# Patient Record
Sex: Male | Born: 1960 | Race: White | Hispanic: No | Marital: Married | Smoking: Never smoker
Health system: Southern US, Community
[De-identification: ages and names within clinical notes are randomized; demographics above are authoritative.]

## PROBLEM LIST (undated history)

## (undated) DIAGNOSIS — R609 Edema, unspecified: Secondary | ICD-10-CM

## (undated) DIAGNOSIS — I2699 Other pulmonary embolism without acute cor pulmonale: Secondary | ICD-10-CM

## (undated) DIAGNOSIS — G4730 Sleep apnea, unspecified: Secondary | ICD-10-CM

## (undated) DIAGNOSIS — I10 Essential (primary) hypertension: Secondary | ICD-10-CM

## (undated) DIAGNOSIS — K219 Gastro-esophageal reflux disease without esophagitis: Secondary | ICD-10-CM

## (undated) HISTORY — DX: Other pulmonary embolism without acute cor pulmonale: I26.99

## (undated) HISTORY — PX: FRACTURE SURGERY: SHX138

## (undated) HISTORY — PX: CARDIAC CATHETERIZATION: SHX172

---

## 2005-08-23 ENCOUNTER — Ambulatory Visit: Payer: Self-pay

## 2007-02-10 ENCOUNTER — Ambulatory Visit: Payer: Self-pay | Admitting: Specialist

## 2008-11-02 ENCOUNTER — Ambulatory Visit: Payer: Self-pay | Admitting: Internal Medicine

## 2011-07-20 ENCOUNTER — Ambulatory Visit: Payer: Self-pay | Admitting: Gastroenterology

## 2011-07-23 LAB — PATHOLOGY REPORT

## 2013-03-25 ENCOUNTER — Ambulatory Visit: Payer: Self-pay | Admitting: Surgery

## 2013-03-25 LAB — BASIC METABOLIC PANEL
Anion Gap: 5 — ABNORMAL LOW (ref 7–16)
BUN: 18 mg/dL (ref 7–18)
Calcium, Total: 9 mg/dL (ref 8.5–10.1)
Chloride: 107 mmol/L (ref 98–107)
Creatinine: 0.77 mg/dL (ref 0.60–1.30)
EGFR (African American): >60
Osmolality: 280 (ref 275–301)
Potassium: 3.8 mmol/L (ref 3.5–5.1)

## 2013-03-25 LAB — CBC WITH DIFFERENTIAL/PLATELET
Basophil #: 0 10*3/uL (ref 0.0–0.1)
Basophil %: 0.7 %
Eosinophil %: 1.3 %
HCT: 40.5 % (ref 40.0–52.0)
MCH: 30.6 pg (ref 26.0–34.0)
MCHC: 34.9 g/dL (ref 32.0–36.0)
MCV: 88 fL (ref 80–100)
Monocyte #: 0.5 x10 3/mm (ref 0.2–1.0)
Monocyte %: 7.2 %
Neutrophil %: 65 %
Platelet: 181 10*3/uL (ref 150–440)
RBC: 4.63 10*6/uL (ref 4.40–5.90)
WBC: 6.4 10*3/uL (ref 3.8–10.6)

## 2013-03-31 ENCOUNTER — Ambulatory Visit: Payer: Self-pay | Admitting: Surgery

## 2013-12-27 ENCOUNTER — Emergency Department: Payer: Self-pay | Admitting: Emergency Medicine

## 2014-05-04 DIAGNOSIS — E782 Mixed hyperlipidemia: Secondary | ICD-10-CM | POA: Insufficient documentation

## 2014-08-06 NOTE — Op Note (Signed)
PATIENT NAME:  Nathan Scott, Nathan Scott MR#:  [id number] DATE OF BIRTH:  1960/04/29  DATE OF PROCEDURE:  03/31/2013  PREOPERATIVE DIAGNOSIS: Right inguinal hernia.   POSTOPERATIVE DIAGNOSIS: Right inguinal hernia.   PROCEDURE: Right inguinal hernia repair.   SURGEON: Rochel Brome, MD.   ANESTHESIA: General.   INDICATION: This 54 year old male has a history of pain in the right groin associated with heavy lifting and straining and did have physical findings of a right inguinal hernia and repair was recommended for definitive treatment.   DESCRIPTION OF PROCEDURE: The patient was placed on the operating table in the supine position under general anesthesia. The right lower abdomen was clipped and prepared with ChloraPrep and draped in a sterile manner.   A right lower quadrant transversely oriented suprapubic incision was made and carried down through subcutaneous tissues. One small traversing vein was divided with electrocautery. Several small bleeding points were cauterized. Scarpa's fascia was incised. The external oblique aponeurosis was incised along the course of its fibers to expose the inguinal cord structures. The cord structures were mobilized seeing no indirect component. There was a finding of a direct inguinal hernia with a 3 cm defect in the floor of the inguinal canal with a sac protruding up through this defect. The sac was dissected free from surrounding structures and was shallow and was inverted. The shelving edge of the inguinal ligament was identified. Next, the repair was carried out with a row of 0 Surgilon sutures, suturing the conjoined tendon to the shelving edge of the inguinal ligament incorporating transversalis fascia into the repair and the last stitch was placed adjacent to the internal ring. The repair looked good and only Bard soft polypropylene mesh was placed over the repair. This was approximately 2.5 x 3.5 cm in dimension. It was sutured to the repair with 0 Surgilon and  also sutured medially to the conjoined tendon. Hemostasis was intact. Cord structures were replaced along the floor of the inguinal canal. Cut edges of the external oblique aponeurosis were closed with a running 4-0 Vicryl suture. The deep fascia superior and lateral to the repair site was infiltrated with 0.5% Sensorcaine with epinephrine. Subcutaneous tissues were infiltrated as well using a total of 20 mL. Scarpa's fascia was closed with interrupted 4-0 Vicryl. The skin was closed with running 4-0 Monocryl subcuticular suture and Dermabond. The patient tolerated the procedure satisfactorily and was prepared for transfer to the recovery room.   ____________________________ Lenna Sciara. Rochel Brome, MD jws:aw D: 03/31/2013 08:43:44 ET T: 03/31/2013 08:55:31 ET JOB#: [id number]  cc: Loreli Dollar, MD, <Dictator> Loreli Dollar MD ELECTRONICALLY SIGNED 04/01/2013 10:34

## 2015-07-14 ENCOUNTER — Other Ambulatory Visit: Payer: Self-pay | Admitting: Internal Medicine

## 2015-07-14 DIAGNOSIS — E785 Hyperlipidemia, unspecified: Secondary | ICD-10-CM

## 2015-07-20 ENCOUNTER — Ambulatory Visit
Admission: RE | Admit: 2015-07-20 | Discharge: 2015-07-20 | Disposition: A | Discharge status: Home / Self Care | Payer: Managed Care, Other (non HMO) | Source: Ambulatory Visit | Attending: Internal Medicine | Admitting: Internal Medicine

## 2015-07-20 DIAGNOSIS — E785 Hyperlipidemia, unspecified: Secondary | ICD-10-CM | POA: Diagnosis not present

## 2015-07-20 DIAGNOSIS — I6529 Occlusion and stenosis of unspecified carotid artery: Secondary | ICD-10-CM | POA: Diagnosis present

## 2016-02-13 ENCOUNTER — Encounter
Admission: RE | Admit: 2016-02-13 | Discharge: 2016-02-13 | Disposition: A | Discharge status: Home / Self Care | Payer: Managed Care, Other (non HMO) | Source: Ambulatory Visit | Attending: Otolaryngology | Admitting: Otolaryngology

## 2016-02-13 DIAGNOSIS — Z01812 Encounter for preprocedural laboratory examination: Secondary | ICD-10-CM | POA: Diagnosis not present

## 2016-02-13 HISTORY — DX: Sleep apnea, unspecified: G47.30

## 2016-02-13 HISTORY — DX: Gastro-esophageal reflux disease without esophagitis: K21.9

## 2016-02-13 HISTORY — DX: Edema, unspecified: R60.9

## 2016-02-13 LAB — POTASSIUM: POTASSIUM: 3.5 mmol/L (ref 3.5–5.1)

## 2016-02-13 NOTE — Patient Instructions (Signed)
  Your procedure is scheduled on: 02/20/16 Report to Day Surgery.[facility]OND FLOOR To find out your arrival time please call [phone] between 1PM - 3PM on 02/17/16.  Remember: Instructions that are not followed completely may result in serious medical risk, up to and including death, or upon the discretion of your surgeon and anesthesiologist your surgery may need to be rescheduled.    __X__ 1. Do not eat food or drink liquids after midnight. No gum chewing or hard candies.     ____ 2. No Alcohol for 24 hours before or after surgery.   ____ 3. Do Not Smoke For 24 Hours Prior to Your Surgery.   ____ 4. Bring all medications with you on the day of surgery if instructed.    ___X_ 5. Notify your doctor if there is any change in your medical condition     (cold, fever, infections).       Do not wear jewelry, make-up, hairpins, clips or nail polish.  Do not wear lotions, powders, or perfumes. You may wear deodorant.  Do not shave 48 hours prior to surgery. Men may shave face and neck.  Do not bring valuables to the hospital.    [facility] is not responsible for any belongings or valuables.               Contacts, dentures or bridgework may not be worn into surgery.  Leave your suitcase in the car. After surgery it may be brought to your room.  For patients admitted to the hospital, discharge time is determined by your                treatment team.   Patients discharged the day of surgery will not be allowed to drive home.      _X___ Take these medicines the morning of surgery with A SIP OF WATER:    1. OMEPRAZOLE AT BEDTIME AND AM OF SURGERY  2.   3.   4.  5.  6.  ____ Fleet Enema (as directed)   ____ Use CHG Soap as directed  ____ Use inhalers on the day of surgery  ____ Stop metformin 2 days prior to surgery    ____ Take 1/2 of usual insulin dose the night before surgery and none on the morning of surgery.   __X__ Stop Coumadin/Plavix/aspirin on   STOP  ASPIRIN NOW __X__ Stop Anti-inflammatories on             LODINE NOT TAKEN X 1 MONTH   __X__ Stop supplements until after surgery.   ALL EXCEPT MULTI/ MAGNESIUM/ PROBIOTIC  ____ Bring C-Pap to the hospital.   GIVEN LIVING WILL INFO

## 2016-02-17 MED ORDER — PHENYLEPHRINE HCL 10 % OP SOLN
Freq: Once | OPHTHALMIC | Status: DC
  Filled 2016-02-17: qty 10

## 2016-02-20 ENCOUNTER — Ambulatory Visit: Payer: Managed Care, Other (non HMO) | Admitting: Certified Registered"

## 2016-02-20 ENCOUNTER — Ambulatory Visit
Admission: RE | Admit: 2016-02-20 | Discharge: 2016-02-20 | Disposition: A | Discharge status: Home / Self Care | Payer: Managed Care, Other (non HMO) | Source: Ambulatory Visit | Attending: Otolaryngology | Admitting: Otolaryngology

## 2016-02-20 ENCOUNTER — Encounter
Admission: RE | Disposition: A | Discharge status: Home / Self Care | Payer: Self-pay | Source: Ambulatory Visit | Attending: Otolaryngology

## 2016-02-20 DIAGNOSIS — Z7982 Long term (current) use of aspirin: Secondary | ICD-10-CM | POA: Diagnosis not present

## 2016-02-20 DIAGNOSIS — G4733 Obstructive sleep apnea (adult) (pediatric): Secondary | ICD-10-CM | POA: Insufficient documentation

## 2016-02-20 DIAGNOSIS — J301 Allergic rhinitis due to pollen: Secondary | ICD-10-CM | POA: Diagnosis not present

## 2016-02-20 DIAGNOSIS — J342 Deviated nasal septum: Secondary | ICD-10-CM | POA: Diagnosis present

## 2016-02-20 DIAGNOSIS — K219 Gastro-esophageal reflux disease without esophagitis: Secondary | ICD-10-CM | POA: Diagnosis not present

## 2016-02-20 DIAGNOSIS — J343 Hypertrophy of nasal turbinates: Secondary | ICD-10-CM | POA: Insufficient documentation

## 2016-02-20 DIAGNOSIS — E7800 Pure hypercholesterolemia, unspecified: Secondary | ICD-10-CM | POA: Insufficient documentation

## 2016-02-20 HISTORY — PX: NASAL SEPTOPLASTY W/ TURBINOPLASTY: SHX2070

## 2016-02-20 SURGERY — SEPTOPLASTY, NOSE, WITH NASAL TURBINATE REDUCTION
Anesthesia: General | Laterality: Bilateral

## 2016-02-20 MED ORDER — MIDAZOLAM HCL 2 MG/2ML IJ SOLN
INTRAMUSCULAR | Status: DC | PRN
  Administered 2016-02-20: 2 mg via INTRAVENOUS

## 2016-02-20 MED ORDER — NEOSTIGMINE METHYLSULFATE 10 MG/10ML IV SOLN
INTRAVENOUS | Status: DC | PRN
  Administered 2016-02-20: 5 mg via INTRAVENOUS

## 2016-02-20 MED ORDER — LACTATED RINGERS IV SOLN
INTRAVENOUS | Status: DC
  Administered 2016-02-20: 06:00:00 via INTRAVENOUS

## 2016-02-20 MED ORDER — DEXAMETHASONE SODIUM PHOSPHATE 10 MG/ML IJ SOLN
INTRAMUSCULAR | Status: DC | PRN
  Administered 2016-02-20: 10 mg via INTRAVENOUS

## 2016-02-20 MED ORDER — BACITRACIN ZINC 500 UNIT/GM EX OINT
TOPICAL_OINTMENT | CUTANEOUS | Status: AC
  Filled 2016-02-20: qty 28.35

## 2016-02-20 MED ORDER — PROPOFOL 10 MG/ML IV BOLUS
INTRAVENOUS | Status: DC | PRN
  Administered 2016-02-20: 200 mg via INTRAVENOUS

## 2016-02-20 MED ORDER — LIDOCAINE-EPINEPHRINE (PF) 1 %-1:200000 IJ SOLN
INTRAMUSCULAR | Status: AC
  Filled 2016-02-20: qty 30

## 2016-02-20 MED ORDER — ROCURONIUM BROMIDE 100 MG/10ML IV SOLN
INTRAVENOUS | Status: DC | PRN
  Administered 2016-02-20: 5 mg via INTRAVENOUS
  Administered 2016-02-20: 10 mg via INTRAVENOUS
  Administered 2016-02-20: 30 mg via INTRAVENOUS

## 2016-02-20 MED ORDER — FENTANYL CITRATE (PF) 100 MCG/2ML IJ SOLN: 25.0000 ug | INTRAMUSCULAR | Status: DC | PRN

## 2016-02-20 MED ORDER — LIDOCAINE HCL (PF) 4 % IJ SOLN
INTRAMUSCULAR | Status: DC | PRN
  Administered 2016-02-20: 4 mL via INTRADERMAL

## 2016-02-20 MED ORDER — ONDANSETRON HCL 4 MG/2ML IJ SOLN: 4.0000 mg | Freq: Once | INTRAMUSCULAR | Status: DC | PRN

## 2016-02-20 MED ORDER — SUCCINYLCHOLINE CHLORIDE 20 MG/ML IJ SOLN
INTRAMUSCULAR | Status: DC | PRN
  Administered 2016-02-20: 100 mg via INTRAVENOUS

## 2016-02-20 MED ORDER — FENTANYL CITRATE (PF) 100 MCG/2ML IJ SOLN
INTRAMUSCULAR | Status: DC | PRN
  Administered 2016-02-20: 100 ug via INTRAVENOUS

## 2016-02-20 MED ORDER — OXYMETAZOLINE HCL 0.05 % NA SOLN
NASAL | Status: DC | PRN
  Administered 2016-02-20: 4 via TOPICAL

## 2016-02-20 MED ORDER — LIDOCAINE HCL (CARDIAC) 20 MG/ML IV SOLN
INTRAVENOUS | Status: DC | PRN
  Administered 2016-02-20: 40 mg via INTRAVENOUS

## 2016-02-20 MED ORDER — GLYCOPYRROLATE 0.2 MG/ML IJ SOLN
INTRAMUSCULAR | Status: DC | PRN
  Administered 2016-02-20: 1 mg via INTRAVENOUS

## 2016-02-20 MED ORDER — PHENYLEPHRINE HCL 10 % OP SOLN
OPHTHALMIC | Status: DC | PRN
  Administered 2016-02-20: 10 mL via NASAL

## 2016-02-20 MED ORDER — BACITRACIN 500 UNIT/GM OP OINT
TOPICAL_OINTMENT | OPHTHALMIC | Status: AC
  Filled 2016-02-20: qty 3.5

## 2016-02-20 MED ORDER — LIDOCAINE-EPINEPHRINE (PF) 1 %-1:200000 IJ SOLN
INTRAMUSCULAR | Status: DC | PRN
  Administered 2016-02-20: 6 mL

## 2016-02-20 MED ORDER — ONDANSETRON HCL 4 MG/2ML IJ SOLN
INTRAMUSCULAR | Status: DC | PRN
  Administered 2016-02-20: 4 mg via INTRAVENOUS

## 2016-02-20 SURGICAL SUPPLY — 23 items
BLADE SURG 15 STRL LF DISP TIS (BLADE) ×1 IMPLANT
BLADE SURG 15 STRL SS (BLADE) ×2
CANISTER SUCT 1200ML W/VALVE (MISCELLANEOUS) ×3 IMPLANT
COAG SUCT 10F 3.5MM HAND CTRL (MISCELLANEOUS) ×3 IMPLANT
DRSG NASAL 4CM NASOPORE (MISCELLANEOUS) IMPLANT
ELECT REM PT RETURN 9FT ADLT (ELECTROSURGICAL) ×3
ELECTRODE REM PT RTRN 9FT ADLT (ELECTROSURGICAL) ×1 IMPLANT
GLOVE PROTEXIS LATEX SZ 7.5 (GLOVE) ×3 IMPLANT
GOWN STRL REUS W/ TWL LRG LVL3 (GOWN DISPOSABLE) ×2 IMPLANT
GOWN STRL REUS W/TWL LRG LVL3 (GOWN DISPOSABLE) ×4
LABEL OR SOLS (LABEL) IMPLANT
NEEDLE SPNL 25GX3.5 QUINCKE BL (NEEDLE) ×3 IMPLANT
NS IRRIG 500ML POUR BTL (IV SOLUTION) ×3 IMPLANT
PACK HEAD/NECK (MISCELLANEOUS) ×3 IMPLANT
PACKING NASAL EPIS 4X2.4 XEROG (MISCELLANEOUS) ×3 IMPLANT
PATTIES SURGICAL .5 X3 (DISPOSABLE) ×3 IMPLANT
SPLINT NASAL REUTER (MISCELLANEOUS) ×3 IMPLANT
SUT CHROMIC 3-0 (SUTURE) ×2
SUT CHROMIC 3-0 KS 27XMFL CR (SUTURE) ×1
SUT ETHILON 3-0 KS 30 BLK (SUTURE) ×3 IMPLANT
SUT PLAIN GUT 4-0 (SUTURE) IMPLANT
SUTURE CHRMC 3-0 KS 27XMFL CR (SUTURE) ×1 IMPLANT
SYR 3ML LL SCALE MARK (SYRINGE) ×3 IMPLANT

## 2016-02-20 NOTE — H&P (Signed)
  H&P has been reviewed and no changes necessary. To be downloaded later. 

## 2016-02-20 NOTE — Op Note (Signed)
02/20/2016  8:26 AM  [id number]   Pre-Op Dx:  Deviated Nasal Septum, Hypertrophic Inferior Turbinates, hypertrophic middle turbinates  Post-op Dx: Same  Proc: Nasal Septoplasty, Bilateral Partial Reduction Inferior Turbinates, bilateral partial reduction of the middle turbinates   Surg:  Nathan Scott  Anes:  GOT  EBL:  50 mL  Comp:  None  Findings: The septum was deviated to the right side at the cartilaginous plate and ethmoid plate was overhanging cartilage inferiorly on the left. The inferior middle turbinates were enlarged.  Procedure: With the patient in a comfortable supine position,  general orotracheal anesthesia was induced without difficulty.     The patient received preoperative Afrin spray for topical decongestion and vasoconstriction.  Intravenous prophylactic antibiotics were administered.  At an appropriate level, the patient was placed in a semi-sitting position.  Nasal vibrissae were trimmed.   1% Xylocaine with 1:200,000 epinephrine, 6 cc's, was infiltrated into the anterior floor of the nose, into the nasal spine region, into the membranous columella, and finally into the submucoperichondrial plane of the septum on both sides.  Several minutes were allowed for this to take effect.  Cottoniod pledgetts soaked in Afrin and 4% Xylocaine were placed into both nasal cavities and left while the patient was prepped and draped in the standard fashion.  The materials were removed from the nose and observed to be intact and correct in number.  The nose was inspected with a headlight with the findings as described above.  A left Killian incision was sharply executed and carried down to the caudal edge of the quadrangular cartilage. The mucoperichondrium was elelvated along the quadrangular plate back to the bony-cartilaginous junction. The mucoperiostium was then elevated along the ethmoid plate and the vomer. The boney-catilaginous junction was then split with a freer elevator  and the mucoperiosteum was elevated on the opposite side. The mucoperiosteum was then elevated along the maxillary crest as needed to expose the crooked bone of the crest.  Boney spurs of the vomer and maxillary crest were removed with Donavan Foil forceps.  The cartilaginous plate was trimmed along its posterior and inferior borders of about 2 mm of cartilage to free it up inferiorly. Some of the deviated ethmoid plate was then fractured and removed with Takahashi forceps to free up the posterior border of the quadrangular plate and allow it to swing back to the midline. The mucosal flaps were placed back into their anatomic position to allow visualization of the airways. The septum now sat in the midline with an improved airway.  A 3-0 Chromic suture on a Keith needle in used to anchor the inferior septum at the nasal spine with a through and through suture. The mucosal flaps are then sutured together using a through and through whip stitch of 4-0 Plain Gut with a mini-Keith needle. This was used to close the Duck incision as well.   The inferior turbinates were then inspected. An incision was created along the inferior aspect of the left inferior turbinate with removal of some of the inferior soft tissue and bone. Electrocautery was used to control bleeding in the area. The remaining turbinate was then outfractured to open up the airway further. There was no significant bleeding noted. The right turbinate was then trimmed and outfractured in a similar fashion.  Middle turbinates were then visualized and were infractured some. There was lots of redundant tissue along its lateral inferior border. Trimming was done here along with electrocautery. This was done on both sides. A piece of  xerogel was then placed in the middle meatus with widening it to liquefy it.  The airways were then visualized and showed open passageways on both sides that were significantly improved compared to before surgery. There was  no signifcant bleeding. Nasal splints were applied to both sides of the septum using Xomed 0.55mm regular sized splints that were trimmed, and then held in position with a 3-0 Nylon through and through suture.  The patient was turned back over to anesthesia, and awakened, extubated, and taken to the PACU in satisfactory condition.  Dispo:   PACU to home  Plan: Ice, elevation, narcotic analgesia, steroid taper, and prophylactic antibiotics for the duration of indwelling nasal foreign bodies.  We will reevaluate the patient in the office in 6 days and remove the septal splints.  Return to work in 10 days, strenuous activities in two weeks.   Nathan Scott 02/20/2016 8:26 AM

## 2016-02-20 NOTE — Progress Notes (Addendum)
Chaplain rounded the unit to complete Advance Directive before surgery.  Paperwork completed. Absence of notary. Notaries unavailable. Minerva Fester [phone]

## 2016-02-20 NOTE — Anesthesia Procedure Notes (Addendum)
Procedure Name: Intubation Performed by: Lance Muss Pre-anesthesia Checklist: Patient identified, Patient being monitored, Timeout performed, Emergency Drugs available and Suction available Patient Re-evaluated:Patient Re-evaluated prior to inductionOxygen Delivery Method: Circle system utilized Preoxygenation: Pre-oxygenation with 100% oxygen Intubation Type: IV induction Ventilation: Mask ventilation without difficulty Laryngoscope Size: Mac and 4 Grade View: Grade I Tube type: Oral Rae Tube size: 7.5 mm Number of attempts: 1 Airway Equipment and Method: Stylet and LTA kit utilized Placement Confirmation: ETT inserted through vocal cords under direct vision,  positive ETCO2 and breath sounds checked- equal and bilateral Tube secured with: Tape Dental Injury: Teeth and Oropharynx as per pre-operative assessment

## 2016-02-20 NOTE — Anesthesia Postprocedure Evaluation (Signed)
Anesthesia Post Note  Patient: Nathan Scott  Procedure(s) Performed: Procedure(s) (LRB): NASAL SEPTOPLASTY WITH TURBINATE REDUCTION (Bilateral)  Patient location during evaluation: PACU Anesthesia Type: General Level of consciousness: awake and alert Pain management: pain level controlled Vital Signs Assessment: post-procedure vital signs reviewed and stable Respiratory status: spontaneous breathing, nonlabored ventilation, respiratory function stable and patient connected to nasal cannula oxygen Cardiovascular status: blood pressure returned to baseline and stable Postop Assessment: no signs of nausea or vomiting Anesthetic complications: no    Last Vitals:  Vitals:   02/20/16 0907 02/20/16 0922  BP: 126/89 131/87  Pulse: 73 62  Resp: 16 18  Temp:      Last Pain:  Vitals:   02/20/16 0922  TempSrc:   PainSc: 1                  Molli Barrows

## 2016-02-20 NOTE — Transfer of Care (Signed)
Immediate Anesthesia Transfer of Care Note  Patient: Nathan Scott  Procedure(s) Performed: Procedure(s): NASAL SEPTOPLASTY WITH TURBINATE REDUCTION (Bilateral)  Patient Location: PACU  Anesthesia Type:General  Level of Consciousness: sedated and responds to stimulation  Airway & Oxygen Therapy: Patient Spontanous Breathing and Patient connected to face mask oxygen  Post-op Assessment: Report given to RN and Post -op Vital signs reviewed and stable  Post vital signs: Reviewed and stable  Last Vitals:  Vitals:   02/20/16 0837 02/20/16 0838  BP: 125/82 125/82  Pulse: 78 72  Resp: 14 11  Temp: 36.5 C     Last Pain:  Vitals:   02/20/16 0837  TempSrc:   PainSc: (P) Asleep         Complications: No apparent anesthesia complications

## 2016-02-20 NOTE — OR Nursing (Signed)
Hospital Chaplin in to see patient and wife.  Both were made aware that currently there is not a notary available.  Dr Ladene Artist present as well. Patient and wife want to proceed with surgery at this time.

## 2016-02-20 NOTE — Anesthesia Preprocedure Evaluation (Signed)
Anesthesia Evaluation  Patient identified by MRN, date of birth, ID band Patient awake    Reviewed: Allergy & Precautions, H&P , NPO status , Patient's Chart, lab work & pertinent test results, reviewed documented beta blocker date and time   Airway Mallampati: II  TM Distance: >3 FB Neck ROM: full    Dental  (+) Teeth Intact   Pulmonary neg pulmonary ROS,    Pulmonary exam normal        Cardiovascular negative cardio ROS Normal cardiovascular exam Rhythm:regular Rate:Normal     Neuro/Psych Previous vasovagal from trumpet, o/w neg negative neurological ROS  negative psych ROS   GI/Hepatic negative GI ROS, Neg liver ROS, GERD  Medicated,  Endo/Other  negative endocrine ROS  Renal/GU negative Renal ROS  negative genitourinary   Musculoskeletal   Abdominal   Peds  Hematology negative hematology ROS (+)   Anesthesia Other Findings Past Medical History: No date: Edema     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 No date: GERD (gastroesophageal reflux disease) No date: Sleep apnea     Comment: no cpap x 8 years Past Surgical History: No date: FRACTURE SURGERY Left     Comment: pinning leg fx BMI    Body Mass Index:  29.03 kg/m     Reproductive/Obstetrics negative OB ROS                             Anesthesia Physical Anesthesia Plan  ASA: II  Anesthesia Plan: General ETT   Post-op Pain Management:    Induction:   Airway Management Planned:   Additional Equipment:   Intra-op Plan:   Post-operative Plan:   Informed Consent: I have reviewed the patients History and Physical, chart, labs and discussed the procedure including the risks, benefits and alternatives for the proposed anesthesia with the patient or authorized representative who has indicated his/her understanding and acceptance.   Dental Advisory Given  Plan Discussed with: CRNA  Anesthesia Plan Comments:          Anesthesia Quick Evaluation

## 2016-02-20 NOTE — OR Nursing (Signed)
Hospital Chaplain notified upon patient's arrival to unit  that patient would like to complete advanced directives.

## 2016-02-20 NOTE — Discharge Instructions (Signed)

## 2016-02-22 ENCOUNTER — Encounter: Payer: Self-pay | Admitting: Otolaryngology

## 2016-07-26 ENCOUNTER — Ambulatory Visit: Payer: Commercial Managed Care - PPO | Attending: Orthopedic Surgery | Admitting: Physical Therapy

## 2016-07-26 DIAGNOSIS — G8929 Other chronic pain: Secondary | ICD-10-CM | POA: Insufficient documentation

## 2016-07-26 DIAGNOSIS — M25562 Pain in left knee: Secondary | ICD-10-CM | POA: Insufficient documentation

## 2016-07-27 NOTE — Therapy (Signed)
[place] [facility] [address], [place], [address] Phone: [phone]   Fax:  [phone]  Physical Therapy Evaluation  Patient Details  Name: Nathan Scott MRN: [id number] Date of Birth: [birth year] No Data Recorded  Encounter Date: 07/26/2016      PT End of Session - 07/27/16 1051    Visit Number 1   Number of Visits 7   Date for PT Re-Evaluation 09/07/16   PT Start Time 1710   PT Stop Time 1820   PT Time Calculation (min) 70 min   Activity Tolerance Patient tolerated treatment well   Behavior During Therapy [facility] for tasks assessed/performed      Past Medical History:  Diagnosis Date  . 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 GERD (gastroesophageal reflux disease)   . Sleep apnea    no cpap x 8 years    Past Surgical History:  Procedure Laterality Date  . FRACTURE SURGERY Left    pinning leg fx  . NASAL SEPTOPLASTY W/ TURBINOPLASTY Bilateral 02/20/2016   Procedure: NASAL SEPTOPLASTY WITH TURBINATE REDUCTION;  Surgeon: Margaretha Sheffield, MD;  Location: [facility] ORS;  Service: ENT;  Laterality: Bilateral;    There were no vitals filed for this visit.       Subjective Assessment - 07/26/16 1723    Subjective Patient reports he had a pin in his L leg from his knee to his ankle roughly 25 years ago. He reports he would like to lose weight and become more active, he reports he sits a lot of the day for his job. He has trouble with kneeling (especially on his L knee). He does have a history of back pain and wears a pair of HappyFeet insoles. Prior to having these he was having lower back, hip, knee, foot pain even after being on his feet for 45 minutes. He doesn't have those issues currently from use of insoles. He reports he has had this hot feeling and periods of pain especially with compression. He does report he takes the stairs and gets tired, but not painful.    Limitations Other (comment)  Kneeling   Diagnostic tests  Small bone spurring noted in L knee, not a significant finding per patient.    Patient Stated Goals To reduce his L knee pain with kneeling so he can help perform joint mobilizations on wife, who was having PT.    Currently in Pain? Other (Comment)  Patient reports no pain at rest, but at times gets hyperalgesia around his L knee more than R knee as well as significant discomfort if he kneels on his L knee      Gait observation - no obvious abnormalities, similar mechanics observed bilaterally   Squat mechanics- notable for pronation and heels rising, no valgus and appropriate hip depth observed. He is able to rise from a chair without use of his UEs.   Slump test - positive bilaterally L>R   SLR - negative bilaterally   Hip ER- WNL bilaterally, hip IR - <5 degrees bilaterally   HS length at 90-90 notable deficit of ~30 degrees, though not reproductive of his pain   Prone CPAs to L3/L4   Ely's test- reduced tissue length L>R, but not reproductive of his pain   Stair ascent/descent- appropriate with no abnormalities or pain reported.   DTRs- 2+ at knees bilaterally, not appreciated at ankles, though this may have been due to tension he held in musculature.  Sensory examination -  WNL bilaterally   McKenzie extension -- patient reported no pain while completing, performed 2 bouts x 10 repetitions (no increase in pain noted)   Prone Ely's/quad stretching x 10 for 3 sets holding 3-5" (felt stretch in his thigh, but no other reproduction of symptoms reported)   Educated on calf stretching   LEFS- 72/80  Kneeling at the end of the session was noted to be less painful on LLE than he anticipated, though was still aware of pain at this area.   Passive hip flexor length limited bilaterally L>R                       PT Education - 07/27/16 1058    Education provided Yes   Education Details Likely source of discomfort (either quad restrictions or  hyperalgesia/hypomobility in his lumbar spine). HEP to address.    Person(s) Educated Patient   Methods Explanation;Demonstration;Handout   Comprehension Verbalized understanding;Returned demonstration             PT Long Term Goals - 07/27/16 1052      PT LONG TERM GOAL #1   Title Patient will be able to kneel on his L knee to assist with treatment of his wife's back pain.    Baseline Pain with compression of L knee    Time 6   Period Weeks   Status New     PT LONG TERM GOAL #2   Title Patient will report LEFS of > 75/80 to demonstrate improved tolerance for ADLs.    Baseline 72/80   Time 6   Period Weeks   Status New               Plan - 07/27/16 1054    Clinical Impression Statement Patient is a pleasant 56 y/o male with a history of hyperalgesia and sensation of "hotness" in his L knee>R knee intermittently, with heightened pain with compression (such as kneeling). On examination today he did have positive slump test L>R and pain at L3/L4 with joint mobilizations, notable for decreased sensitivty in L knee with kneeling to conclude session. Given the above findings, it appears a combination of quadriceps tightness could be compressing Hoffa's fat pad, along with hyperalgesia in his lower lumbar spine, could be leading to hyperalgesia at his L knee. Provided McKenzie extensions with quad and calf stretching to address the observed deficits in this session. Patient would benefit form skilled PT services to address his L knee pain to allow for return to ADLs.    Rehab Potential Good   PT Frequency 1x / week   PT Duration 6 weeks   PT Treatment/Interventions Therapeutic exercise;Therapeutic activities;Neuromuscular re-education;Manual techniques;Dry needling;Taping   PT Next Visit Plan Re-assess manual CPAs on lumbar spine, quad stretching    PT Home Exercise Plan Prone on elbows, calf stretching, quad stretching    Consulted and Agree with Plan of Care Patient       Patient will benefit from skilled therapeutic intervention in order to improve the following deficits and impairments:  Pain  Visit Diagnosis: Chronic pain of left knee - Plan: PT plan of care cert/re-cert     Problem List There are no active problems to display for this patient.  Royce Macadamia PT, DPT, CSCS    07/27/2016, 11:00 AM  [place] [facility] [address] [place], [address] Phone: [phone]   Fax:  [phone]  Name: Nathan Scott MRN: [id number] Date  of Birth: Dec 16, 1960

## 2016-07-30 ENCOUNTER — Ambulatory Visit: Payer: Commercial Managed Care - PPO | Admitting: Physical Therapy

## 2016-08-02 ENCOUNTER — Ambulatory Visit: Payer: Commercial Managed Care - PPO | Admitting: Physical Therapy

## 2016-08-06 ENCOUNTER — Ambulatory Visit: Payer: Commercial Managed Care - PPO | Admitting: Physical Therapy

## 2016-08-06 DIAGNOSIS — G8929 Other chronic pain: Secondary | ICD-10-CM

## 2016-08-06 DIAGNOSIS — M25562 Pain in left knee: Secondary | ICD-10-CM | POA: Diagnosis not present

## 2016-08-06 NOTE — Patient Instructions (Signed)
Prone CPAs  Ely's stretch   HS stretch (SLR with overpressure)

## 2016-08-07 NOTE — Therapy (Signed)
[place] [facility] [address] [place], [address] Phone: [phone]   Fax:  [phone]  Physical Therapy Treatment  Patient Details  Name: Nathan Scott MRN: [id number] Date of Birth: 20-Sep-1960 No Data Recorded  Encounter Date: 08/06/2016      PT End of Session - 08/06/16 1605    Visit Number 2   Number of Visits 7   Date for PT Re-Evaluation 09/07/16   PT Start Time 1535   PT Stop Time 1600   PT Time Calculation (min) 25 min   Activity Tolerance Patient tolerated treatment well   Behavior During Therapy [facility] for tasks assessed/performed      Past Medical History:  Diagnosis Date  . 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 GERD (gastroesophageal reflux disease)   . Sleep apnea    no cpap x 8 years    Past Surgical History:  Procedure Laterality Date  . FRACTURE SURGERY Left    pinning leg fx  . NASAL SEPTOPLASTY W/ TURBINOPLASTY Bilateral 02/20/2016   Procedure: NASAL SEPTOPLASTY WITH TURBINATE REDUCTION;  Surgeon: Margaretha Sheffield, MD;  Location: [facility] ORS;  Service: ENT;  Laterality: Bilateral;    There were no vitals filed for this visit.      Subjective Assessment - 08/06/16 1539    Subjective Patient reports he has had a reduction in symptoms generally though not totally resolved. He reports he is not sure if this is from his medications kicking in or the treatment (and HEP).    Limitations Other (comment)  Kneeling   Diagnostic tests Small bone spurring noted in L knee, not a significant finding per patient.    Patient Stated Goals To reduce his L knee pain with kneeling so he can help perform joint mobilizations on wife, who was having PT.    Currently in Pain? Other (Comment)  Reports a dull ache, but nothing like what it used to be.       Prone CPAs throughout lumbar spine, as PT went further down L spine, patient reported increased symptoms of pain/stiffness. Performed 30-45" for 3 bouts at L3, 4,  5 with reported decrease in symptoms with multiple bouts provided.   Ely's stretch -- continues to be limited in ROM excursion (performed x 4 per side for 30"-60" holds). Though not as painful when performed on this date as in evaluation   HS stretch (SLR with overpressure) -- was noting some cramping sensation in hamstrings with ely's stretching, reported decrease in symptoms with HS stretching x 3 minutes bilaterally                            PT Education - 08/06/16 1605    Education provided Yes   Education Details Cotninue with stretching program, monitor symptoms follow up next week and if feeling better will discharge   Person(s) Educated Patient   Methods Demonstration;Explanation   Comprehension Verbalized understanding;Returned demonstration             PT Long Term Goals - 07/27/16 1052      PT LONG TERM GOAL #1   Title Patient will be able to kneel on his L knee to assist with treatment of his wife's back pain.    Baseline Pain with compression of L knee    Time 6   Period Weeks   Status New     PT LONG TERM GOAL #2  Title Patient will report LEFS of > 75/80 to demonstrate improved tolerance for ADLs.    Baseline 72/80   Time 6   Period Weeks   Status New               Plan - 08/06/16 1606    Clinical Impression Statement Patient reports significant improvement in symptoms, states he can now kneel with only mild episodic pain now. Reports he has less pain now throughout the day, ability to perform more of his ADLs without discomfort. He seems to be progressing well towards goal of increased function with decreased pain.    Rehab Potential Good   PT Frequency 1x / week   PT Duration 6 weeks   PT Treatment/Interventions Therapeutic exercise;Therapeutic activities;Neuromuscular re-education;Manual techniques;Dry needling;Taping   PT Next Visit Plan Re-assess manual CPAs on lumbar spine, quad stretching    PT Home Exercise Plan Prone  on elbows, calf stretching, quad stretching    Consulted and Agree with Plan of Care Patient      Patient will benefit from skilled therapeutic intervention in order to improve the following deficits and impairments:  Pain  Visit Diagnosis: Chronic pain of left knee     Problem List There are no active problems to display for this patient.  Royce Macadamia PT, DPT, CSCS    08/07/2016, 2:51 PM  [facility] [address] [place], [address] Phone: [phone]   Fax:  [phone]  Name: Nathan Scott MRN: [id number] Date of Birth: [birth year]

## 2016-08-09 ENCOUNTER — Encounter: Payer: Managed Care, Other (non HMO) | Admitting: Physical Therapy

## 2016-08-14 ENCOUNTER — Ambulatory Visit: Payer: Commercial Managed Care - PPO | Attending: Orthopedic Surgery | Admitting: Physical Therapy

## 2016-08-14 DIAGNOSIS — M25562 Pain in left knee: Secondary | ICD-10-CM | POA: Insufficient documentation

## 2016-08-14 DIAGNOSIS — G8929 Other chronic pain: Secondary | ICD-10-CM | POA: Insufficient documentation

## 2016-08-15 NOTE — Therapy (Signed)
[place] [facility] [address] [place], [address] Phone: [phone]   Fax:  [phone]  Physical Therapy Treatment  Patient Details  Name: Nathan Scott MRN: [id number] Date of Birth: [birth year] No Data Recorded  Encounter Date: 08/14/2016      PT End of Session - 08/15/16 1258    Visit Number 3   Number of Visits 7   Date for PT Re-Evaluation 09/07/16   PT Start Time 2683   PT Stop Time 1800   PT Time Calculation (min) 15 min   Activity Tolerance Patient tolerated treatment well   Behavior During Therapy [facility] for tasks assessed/performed      Past Medical History:  Diagnosis Date  . 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 GERD (gastroesophageal reflux disease)   . Sleep apnea    no cpap x 8 years    Past Surgical History:  Procedure Laterality Date  . FRACTURE SURGERY Left    pinning leg fx  . NASAL SEPTOPLASTY W/ TURBINOPLASTY Bilateral 02/20/2016   Procedure: NASAL SEPTOPLASTY WITH TURBINATE REDUCTION;  Surgeon: Margaretha Sheffield, MD;  Location: [facility] ORS;  Service: ENT;  Laterality: Bilateral;    There were no vitals filed for this visit.      Subjective Assessment - 08/15/16 1255    Subjective Patient reports he is longer having the same complaints of burning around the knee with kneeling that brought him to therapy. He believes it is some combinaiton of the therapeutic intervention and medications.    Limitations Other (comment)  Kneeling   Diagnostic tests Small bone spurring noted in L knee, not a significant finding per patient.    Patient Stated Goals To reduce his L knee pain with kneeling so he can help perform joint mobilizations on wife, who was having PT.    Currently in Pain? No/denies                                 PT Education - 08/15/16 1258    Education provided Yes   Education Details Can begin to wean down on medicines as tolerated if he chooses.    Person(s)  Educated Patient   Methods Explanation;Demonstration   Comprehension Verbalized understanding;Returned demonstration             PT Long Term Goals - 08/15/16 1259      PT LONG TERM GOAL #1   Title Patient will be able to kneel on his L knee to assist with treatment of his wife's back pain.    Baseline Pain with compression of L knee    Time 6   Period Weeks   Status Achieved     PT LONG TERM GOAL #2   Title Patient will report LEFS of > 75/80 to demonstrate improved tolerance for ADLs.    Baseline 72/80   Time 6   Period Weeks   Status On-going               Plan - 08/15/16 1258    Clinical Impression Statement Patient reports he is no longer getting the burning sensation while kneeling. He is able to perform his ADLs and recreational activities without discomfort. He appears appropriate for discharge, instructed to reach out to therapist if he had any needs.    Rehab Potential Good   PT Frequency 1x / week   PT Duration 6 weeks  PT Treatment/Interventions Therapeutic exercise;Therapeutic activities;Neuromuscular re-education;Manual techniques;Dry needling;Taping   PT Next Visit Plan Re-assess manual CPAs on lumbar spine, quad stretching    PT Home Exercise Plan Prone on elbows, calf stretching, quad stretching    Consulted and Agree with Plan of Care Patient      Patient will benefit from skilled therapeutic intervention in order to improve the following deficits and impairments:  Pain  Visit Diagnosis: Chronic pain of left knee     Problem List There are no active problems to display for this patient.   Royce Macadamia PT, DPT, CSCS    08/15/2016, 1:00 PM  [place] [facility] [address], [place], [address] Phone: [phone]   Fax:  [phone]  Name: Nathan Scott MRN: [id number] Date of Birth: 08-Jan-1961

## 2016-08-21 ENCOUNTER — Ambulatory Visit: Payer: Commercial Managed Care - PPO | Admitting: Physical Therapy

## 2016-08-27 ENCOUNTER — Encounter: Payer: Managed Care, Other (non HMO) | Admitting: Physical Therapy

## 2016-08-28 ENCOUNTER — Encounter: Payer: Managed Care, Other (non HMO) | Admitting: Physical Therapy

## 2016-09-04 ENCOUNTER — Encounter: Payer: Commercial Managed Care - PPO | Admitting: Physical Therapy

## 2017-07-17 DIAGNOSIS — Z8249 Family history of ischemic heart disease and other diseases of the circulatory system: Secondary | ICD-10-CM | POA: Insufficient documentation

## 2018-04-23 DIAGNOSIS — M25521 Pain in right elbow: Secondary | ICD-10-CM | POA: Diagnosis not present

## 2018-04-23 DIAGNOSIS — M25621 Stiffness of right elbow, not elsewhere classified: Secondary | ICD-10-CM | POA: Diagnosis not present

## 2018-04-23 DIAGNOSIS — M25511 Pain in right shoulder: Secondary | ICD-10-CM | POA: Diagnosis not present

## 2018-05-01 DIAGNOSIS — M25621 Stiffness of right elbow, not elsewhere classified: Secondary | ICD-10-CM | POA: Diagnosis not present

## 2018-05-01 DIAGNOSIS — M25521 Pain in right elbow: Secondary | ICD-10-CM | POA: Diagnosis not present

## 2018-05-01 DIAGNOSIS — M25511 Pain in right shoulder: Secondary | ICD-10-CM | POA: Diagnosis not present

## 2018-05-08 DIAGNOSIS — M25621 Stiffness of right elbow, not elsewhere classified: Secondary | ICD-10-CM | POA: Diagnosis not present

## 2018-05-08 DIAGNOSIS — M25521 Pain in right elbow: Secondary | ICD-10-CM | POA: Diagnosis not present

## 2018-05-08 DIAGNOSIS — M25511 Pain in right shoulder: Secondary | ICD-10-CM | POA: Diagnosis not present

## 2018-05-15 DIAGNOSIS — M25511 Pain in right shoulder: Secondary | ICD-10-CM | POA: Diagnosis not present

## 2018-05-15 DIAGNOSIS — M25621 Stiffness of right elbow, not elsewhere classified: Secondary | ICD-10-CM | POA: Diagnosis not present

## 2018-05-15 DIAGNOSIS — M25521 Pain in right elbow: Secondary | ICD-10-CM | POA: Diagnosis not present

## 2018-05-20 DIAGNOSIS — M79621 Pain in right upper arm: Secondary | ICD-10-CM | POA: Diagnosis not present

## 2018-05-20 DIAGNOSIS — R200 Anesthesia of skin: Secondary | ICD-10-CM | POA: Diagnosis not present

## 2018-05-28 DIAGNOSIS — M25621 Stiffness of right elbow, not elsewhere classified: Secondary | ICD-10-CM | POA: Diagnosis not present

## 2018-05-28 DIAGNOSIS — M25511 Pain in right shoulder: Secondary | ICD-10-CM | POA: Diagnosis not present

## 2018-05-28 DIAGNOSIS — M25521 Pain in right elbow: Secondary | ICD-10-CM | POA: Diagnosis not present

## 2018-06-02 DIAGNOSIS — D225 Melanocytic nevi of trunk: Secondary | ICD-10-CM | POA: Diagnosis not present

## 2018-06-02 DIAGNOSIS — D485 Neoplasm of uncertain behavior of skin: Secondary | ICD-10-CM | POA: Diagnosis not present

## 2018-06-02 DIAGNOSIS — Z1283 Encounter for screening for malignant neoplasm of skin: Secondary | ICD-10-CM | POA: Diagnosis not present

## 2018-06-02 DIAGNOSIS — L919 Hypertrophic disorder of the skin, unspecified: Secondary | ICD-10-CM | POA: Diagnosis not present

## 2018-06-02 DIAGNOSIS — D2230 Melanocytic nevi of unspecified part of face: Secondary | ICD-10-CM | POA: Diagnosis not present

## 2018-06-04 DIAGNOSIS — M25621 Stiffness of right elbow, not elsewhere classified: Secondary | ICD-10-CM | POA: Diagnosis not present

## 2018-06-04 DIAGNOSIS — M25611 Stiffness of right shoulder, not elsewhere classified: Secondary | ICD-10-CM | POA: Diagnosis not present

## 2018-06-04 DIAGNOSIS — M25511 Pain in right shoulder: Secondary | ICD-10-CM | POA: Diagnosis not present

## 2018-06-11 DIAGNOSIS — M25511 Pain in right shoulder: Secondary | ICD-10-CM | POA: Diagnosis not present

## 2018-06-11 DIAGNOSIS — M25521 Pain in right elbow: Secondary | ICD-10-CM | POA: Diagnosis not present

## 2018-06-11 DIAGNOSIS — M25621 Stiffness of right elbow, not elsewhere classified: Secondary | ICD-10-CM | POA: Diagnosis not present

## 2018-06-18 DIAGNOSIS — M25621 Stiffness of right elbow, not elsewhere classified: Secondary | ICD-10-CM | POA: Diagnosis not present

## 2018-07-16 DIAGNOSIS — Z125 Encounter for screening for malignant neoplasm of prostate: Secondary | ICD-10-CM | POA: Diagnosis not present

## 2018-07-16 DIAGNOSIS — Z0000 Encounter for general adult medical examination without abnormal findings: Secondary | ICD-10-CM | POA: Diagnosis not present

## 2018-07-22 DIAGNOSIS — Z0000 Encounter for general adult medical examination without abnormal findings: Secondary | ICD-10-CM | POA: Diagnosis not present

## 2018-07-23 DIAGNOSIS — R319 Hematuria, unspecified: Secondary | ICD-10-CM | POA: Diagnosis not present

## 2018-08-06 DIAGNOSIS — R319 Hematuria, unspecified: Secondary | ICD-10-CM | POA: Diagnosis not present

## 2019-01-07 ENCOUNTER — Other Ambulatory Visit: Payer: Self-pay | Admitting: Cardiology

## 2019-01-07 DIAGNOSIS — Z01818 Encounter for other preprocedural examination: Secondary | ICD-10-CM

## 2019-01-08 ENCOUNTER — Other Ambulatory Visit
Admission: RE | Admit: 2019-01-08 | Discharge: 2019-01-08 | Disposition: A | Discharge status: Home / Self Care | Payer: Commercial Managed Care - PPO | Source: Ambulatory Visit | Attending: Cardiology | Admitting: Cardiology

## 2019-01-08 ENCOUNTER — Other Ambulatory Visit: Payer: Self-pay

## 2019-01-08 DIAGNOSIS — Z20822 Contact with and (suspected) exposure to covid-19: Secondary | ICD-10-CM

## 2019-01-08 DIAGNOSIS — Z20828 Contact with and (suspected) exposure to other viral communicable diseases: Secondary | ICD-10-CM | POA: Diagnosis not present

## 2019-01-08 DIAGNOSIS — R9439 Abnormal result of other cardiovascular function study: Secondary | ICD-10-CM | POA: Insufficient documentation

## 2019-01-08 DIAGNOSIS — Z01812 Encounter for preprocedural laboratory examination: Secondary | ICD-10-CM | POA: Insufficient documentation

## 2019-01-09 LAB — SARS CORONAVIRUS 2 (TAT 6-24 HRS): SARS Coronavirus 2: NEGATIVE

## 2019-01-12 ENCOUNTER — Ambulatory Visit
Admission: RE | Admit: 2019-01-12 | Discharge: 2019-01-12 | Disposition: A | Discharge status: Home / Self Care | Payer: Commercial Managed Care - PPO | Attending: Cardiology | Admitting: Cardiology

## 2019-01-12 ENCOUNTER — Other Ambulatory Visit: Payer: Self-pay

## 2019-01-12 ENCOUNTER — Encounter
Admission: RE | Disposition: A | Discharge status: Home / Self Care | Payer: Self-pay | Source: Home / Self Care | Attending: Cardiology

## 2019-01-12 ENCOUNTER — Encounter: Payer: Self-pay | Admitting: *Deleted

## 2019-01-12 DIAGNOSIS — Z7982 Long term (current) use of aspirin: Secondary | ICD-10-CM | POA: Insufficient documentation

## 2019-01-12 DIAGNOSIS — Z79899 Other long term (current) drug therapy: Secondary | ICD-10-CM | POA: Diagnosis not present

## 2019-01-12 DIAGNOSIS — I6521 Occlusion and stenosis of right carotid artery: Secondary | ICD-10-CM | POA: Diagnosis not present

## 2019-01-12 DIAGNOSIS — Z8249 Family history of ischemic heart disease and other diseases of the circulatory system: Secondary | ICD-10-CM | POA: Diagnosis not present

## 2019-01-12 DIAGNOSIS — E785 Hyperlipidemia, unspecified: Secondary | ICD-10-CM | POA: Diagnosis not present

## 2019-01-12 DIAGNOSIS — R9439 Abnormal result of other cardiovascular function study: Secondary | ICD-10-CM | POA: Diagnosis present

## 2019-01-12 DIAGNOSIS — K219 Gastro-esophageal reflux disease without esophagitis: Secondary | ICD-10-CM | POA: Diagnosis not present

## 2019-01-12 HISTORY — PX: LEFT HEART CATH AND CORONARY ANGIOGRAPHY: CATH118249

## 2019-01-12 SURGERY — LEFT HEART CATH AND CORONARY ANGIOGRAPHY
Anesthesia: Moderate Sedation | Laterality: Left

## 2019-01-12 MED ORDER — SODIUM CHLORIDE 0.9% FLUSH: 3.0000 mL | Freq: Two times a day (BID) | INTRAVENOUS | Status: DC

## 2019-01-12 MED ORDER — SODIUM CHLORIDE 0.9 % IV SOLN: 250.0000 mL | INTRAVENOUS | Status: DC | PRN

## 2019-01-12 MED ORDER — HYDRALAZINE HCL 20 MG/ML IJ SOLN: 10.0000 mg | INTRAMUSCULAR | Status: DC | PRN

## 2019-01-12 MED ORDER — ACETAMINOPHEN 325 MG PO TABS: 650.0000 mg | ORAL_TABLET | ORAL | Status: DC | PRN

## 2019-01-12 MED ORDER — HEPARIN (PORCINE) IN NACL 2000-0.9 UNIT/L-% IV SOLN
INTRAVENOUS | Status: DC | PRN
  Administered 2019-01-12: 500 mL

## 2019-01-12 MED ORDER — ASPIRIN 81 MG PO CHEW: 81.0000 mg | CHEWABLE_TABLET | ORAL | Status: DC

## 2019-01-12 MED ORDER — MIDAZOLAM HCL 2 MG/2ML IJ SOLN
INTRAMUSCULAR | Status: DC | PRN
  Administered 2019-01-12: 1 mg via INTRAVENOUS

## 2019-01-12 MED ORDER — FENTANYL CITRATE (PF) 100 MCG/2ML IJ SOLN
INTRAMUSCULAR | Status: DC | PRN
  Administered 2019-01-12: 25 ug via INTRAVENOUS

## 2019-01-12 MED ORDER — SODIUM CHLORIDE 0.9 % WEIGHT BASED INFUSION
3.0000 mL/kg/h | INTRAVENOUS | Status: AC
  Administered 2019-01-12: 3 mL/kg/h via INTRAVENOUS

## 2019-01-12 MED ORDER — ASPIRIN 81 MG PO CHEW
CHEWABLE_TABLET | ORAL | Status: AC
  Filled 2019-01-12: qty 1

## 2019-01-12 MED ORDER — SODIUM CHLORIDE 0.9 % WEIGHT BASED INFUSION: 1.0000 mL/kg/h | INTRAVENOUS | Status: DC

## 2019-01-12 MED ORDER — ASPIRIN 81 MG PO CHEW
CHEWABLE_TABLET | ORAL | Status: DC | PRN
  Administered 2019-01-12: 81 mg via ORAL

## 2019-01-12 MED ORDER — ONDANSETRON HCL 4 MG/2ML IJ SOLN: 4.0000 mg | Freq: Four times a day (QID) | INTRAMUSCULAR | Status: DC | PRN

## 2019-01-12 MED ORDER — SODIUM CHLORIDE 0.9% FLUSH: 3.0000 mL | INTRAVENOUS | Status: DC | PRN

## 2019-01-12 MED ORDER — HEPARIN (PORCINE) IN NACL 1000-0.9 UT/500ML-% IV SOLN
INTRAVENOUS | Status: AC
  Filled 2019-01-12: qty 1000

## 2019-01-12 MED ORDER — LABETALOL HCL 5 MG/ML IV SOLN: 10.0000 mg | INTRAVENOUS | Status: DC | PRN

## 2019-01-12 MED ORDER — IOHEXOL 300 MG/ML  SOLN
INTRAMUSCULAR | Status: DC | PRN
  Administered 2019-01-12: 55 mL

## 2019-01-12 MED ORDER — MIDAZOLAM HCL 2 MG/2ML IJ SOLN
INTRAMUSCULAR | Status: AC
  Filled 2019-01-12: qty 2

## 2019-01-12 MED ORDER — FENTANYL CITRATE (PF) 100 MCG/2ML IJ SOLN
INTRAMUSCULAR | Status: AC
  Filled 2019-01-12: qty 2

## 2019-01-12 SURGICAL SUPPLY — 9 items
CATH INFINITI 5FR ANG PIGTAIL (CATHETERS) ×2 IMPLANT
CATH INFINITI 5FR JL4 (CATHETERS) ×2 IMPLANT
CATH INFINITI JR4 5F (CATHETERS) ×2 IMPLANT
DEVICE CLOSURE MYNXGRIP 5F (Vascular Products) ×2 IMPLANT
KIT MANI 3VAL PERCEP (MISCELLANEOUS) ×2 IMPLANT
NEEDLE PERC 18GX7CM (NEEDLE) ×2 IMPLANT
PACK CARDIAC CATH (CUSTOM PROCEDURE TRAY) ×2 IMPLANT
SHEATH AVANTI 5FR X 11CM (SHEATH) ×2 IMPLANT
WIRE GUIDERIGHT .035X150 (WIRE) ×2 IMPLANT

## 2019-01-12 NOTE — H&P (Signed)
Chief Complaint: Chief Complaint  Patient presents with  . Establish Care  abnormal stress echo--per dr. Sabra Heck  Date of Service: 01/07/2019 Date of Birth: April 29, 1960 PCP: Yevonne Pax, MD  History of Present Illness: Mr. Tannous is a 58 y.o.male patient with a past medical history significant for hyperlipidemia, non-obstructive carotid artery disease, and family history of coronary artery disease who presents to establish care. He was recently seen by his PCP for a few month history of worsening exertional shortness of breath, especially when climbing up stairs or playing his trumpet. He denies associated chest pain/pressure or radiation to left arm or jaw. He denies lower extremity swelling, orthopnea, or PND. He denies recent syncopal or presyncopal episodes. He is currently under a lot of stress with his job and family situation.   We discussed in detail the results of the exercise stress test, performed today on 01/07/19, which revealed exercise induced PVCs as well as ST depressions. Symptoms during the test included severe shortness of breath.   Past Medical and Surgical History  Past Medical History Past Medical History:  Diagnosis Date  . GERD (gastroesophageal reflux disease)  . Other and unspecified hyperlipidemia   Past Surgical History He has a past surgical history that includes Fracture surgery; Adenoidectomy; and Hernia repair.   Medications and Allergies  Current Medications Current Outpatient Medications  Medication Sig Dispense Refill  . ALPRAZolam (XANAX) 0.5 MG tablet Take 1 tablet (0.5 mg total) by mouth nightly as needed for Sleep for up to 30 days 30 tablet 1  . APPLE CIDER VINEGAR ORAL Take by mouth Takes 3 daily at breakfast  . aspirin 81 MG chewable tablet Take 81 mg by mouth once daily.  . cholecalciferol (CHOLECALCIFEROL) 1,000 unit tablet Take 1,000 Units by mouth once daily.  . cyclobenzaprine (FLEXERIL) 10 MG tablet TAKE 1 TABLET (10 MG TOTAL) BY  MOUTH NIGHTLY AS NEEDED FOR MUSCLE SPASMS FOR UP TO 30 DAYS  . docosahexanoic acid/epa (FISH OIL ORAL) Take by mouth 2 daily  . flaxseed oil Oil Use 2 daily  . hydroCHLOROthiazide (HYDRODIURIL) 25 MG tablet Take 1 tablet (25 mg total) by mouth once daily 90 tablet 3  . magnesium oxide 500 mg Tab Take by mouth. One po daily  . meloxicam (MOBIC) 15 MG tablet TAKE 1 TABLET BY MOUTH EVERY DAY 90 tablet 3  . omeprazole (PRILOSEC) 40 MG DR capsule TAKE 1 CAPSULE BY MOUTH EVERY DAY 90 capsule 3  . pyridoxine HCl, vitamin B6, (VITAMIN B-6 ORAL) Take by mouth One daily  . sertraline (ZOLOFT) 50 MG tablet Take 1 tablet (50 mg total) by mouth once daily 90 tablet 3  . simvastatin (ZOCOR) 20 MG tablet Take 1 tablet (20 mg total) by mouth nightly 90 tablet 3  . triamcinolone (NASACORT AQ) 55 mcg nasal spray Place 2 sprays into both nostrils once daily  . triamcinolone 0.1 % cream APPLY TO AFFECTED AREA TWICE A DAY 30 g 1   No current facility-administered medications for this visit.   Allergies Patient has no known allergies.  Social and Family History  Social History reports that he has never smoked. He has never used smokeless tobacco. He reports previous drug use. He reports that he does not drink alcohol.  Family History family history includes Alzheimer's disease in his mother; Cataracts in his mother; Coronary Artery Disease (Blocked arteries around heart) in his father; Dementia in his mother; Myocardial Infarction (Heart attack) in his father; Scoliosis in his mother and sister.  Review of Systems   Review of Systems: The patient denies chest pain, shortness of breath, orthopnea, paroxysmal nocturnal dyspnea, pedal edema, palpitations, heart racing, fatigue, dizziness, lightheadedness, presyncope, syncope, leg pain, leg cramping. Review of 12 Systems is negative except as described in HPI.  Physical Examination   Vitals:BP 122/74  Pulse 89  Resp 20  Ht 185.4 cm (6\' 1" )  Wt 100.4 kg  (221 lb 6.4 oz)  SpO2 99%  BMI 29.21 kg/m  Ht:185.4 cm (6\' 1" ) Wt:100.4 kg (221 lb 6.4 oz) [id number] surface area is 2.27 meters squared. Body mass index is 29.21 kg/m.  General: Well developed, well nourished. In no acute distress HEENT: Pupils equally reactive to light and accomodation  Neck: Supple without thyromegaly, or goiter. Carotid pulses 2+. No carotid bruits present.  Pulmonary: Clear to auscultation bilaterally; no wheezes, rales, rhonchi Cardiovascular: Regular rate and rhythm. No gallops, murmurs or rubs Gastrointestinal: Soft nontender, nondistended, with normal bowel sounds Extremities: No cyanosis, clubbing, or edema Peripheral Pulses: 2+ in upper extremities, 2+ in lower extremities  Neurology: Alert and oriented X3 Pysch: Good affect. Responds appropriately  Assessment and Plan   58 y.o. male with  1. Hyperlipemia, mixed  -Continue simvastatin; pending cardiac catheterization results, may consider transitioning to high intensity statin for optimal LDL control  2. Family history of ASCVD (arteriosclerotic cardiovascular disease)  -See below  3. Stenosis of right carotid artery  -Continue aspirin and simvastatin  -Carotid ultrasound ordered for surveillance  4. Abnormal stress echocardiogram  -With ongoing exertional shortness of breath and abnormal stress echocardiogram, will further assess with cardiac catheterization; risks and benefits were discussed in thorough detail with both the patient and his wife; risks including bleeding, infection, heart attack, stroke, and death all discussed - patient voiced understanding and wishes to proceed with surgery     Orders Placed This Encounter  Procedures  . CARD US carotid bilateral  . Basic Metabolic Panel (BMP)  . CBC w/auto Differential (5 Part)   Return after cath.     Javier Docker Kayleigh Broadwell MD  Pt seen and examined. No change from above.

## 2019-01-12 NOTE — Discharge Instructions (Signed)

## 2019-01-13 ENCOUNTER — Encounter: Payer: Self-pay | Admitting: Cardiology

## 2019-01-19 DIAGNOSIS — R0602 Shortness of breath: Secondary | ICD-10-CM | POA: Insufficient documentation

## 2019-01-19 DIAGNOSIS — R400 Somnolence: Secondary | ICD-10-CM | POA: Insufficient documentation

## 2019-01-22 ENCOUNTER — Inpatient Hospital Stay
Admission: EM | Admit: 2019-01-22 | Discharge: 2019-01-23 | DRG: 164 | Disposition: A | Discharge status: Home / Self Care | Payer: Commercial Managed Care - PPO | Attending: Internal Medicine | Admitting: Internal Medicine

## 2019-01-22 ENCOUNTER — Encounter
Admission: EM | Disposition: A | Discharge status: Home / Self Care | Payer: Self-pay | Source: Home / Self Care | Attending: Internal Medicine

## 2019-01-22 ENCOUNTER — Emergency Department: Payer: Commercial Managed Care - PPO

## 2019-01-22 ENCOUNTER — Other Ambulatory Visit: Payer: Self-pay

## 2019-01-22 ENCOUNTER — Inpatient Hospital Stay: Payer: Commercial Managed Care - PPO

## 2019-01-22 DIAGNOSIS — I2699 Other pulmonary embolism without acute cor pulmonale: Secondary | ICD-10-CM | POA: Diagnosis present

## 2019-01-22 DIAGNOSIS — G4730 Sleep apnea, unspecified: Secondary | ICD-10-CM | POA: Diagnosis present

## 2019-01-22 DIAGNOSIS — I2602 Saddle embolus of pulmonary artery with acute cor pulmonale: Secondary | ICD-10-CM | POA: Diagnosis present

## 2019-01-22 DIAGNOSIS — Z20828 Contact with and (suspected) exposure to other viral communicable diseases: Secondary | ICD-10-CM | POA: Diagnosis present

## 2019-01-22 DIAGNOSIS — Z79899 Other long term (current) drug therapy: Secondary | ICD-10-CM

## 2019-01-22 DIAGNOSIS — R0902 Hypoxemia: Secondary | ICD-10-CM | POA: Diagnosis present

## 2019-01-22 DIAGNOSIS — I313 Pericardial effusion (noninflammatory): Secondary | ICD-10-CM | POA: Diagnosis present

## 2019-01-22 DIAGNOSIS — Z7982 Long term (current) use of aspirin: Secondary | ICD-10-CM | POA: Diagnosis not present

## 2019-01-22 DIAGNOSIS — J9811 Atelectasis: Secondary | ICD-10-CM | POA: Diagnosis present

## 2019-01-22 DIAGNOSIS — E876 Hypokalemia: Secondary | ICD-10-CM | POA: Diagnosis present

## 2019-01-22 DIAGNOSIS — K219 Gastro-esophageal reflux disease without esophagitis: Secondary | ICD-10-CM | POA: Diagnosis present

## 2019-01-22 DIAGNOSIS — Z86711 Personal history of pulmonary embolism: Secondary | ICD-10-CM | POA: Diagnosis present

## 2019-01-22 DIAGNOSIS — I2510 Atherosclerotic heart disease of native coronary artery without angina pectoris: Secondary | ICD-10-CM | POA: Diagnosis present

## 2019-01-22 DIAGNOSIS — I2692 Saddle embolus of pulmonary artery without acute cor pulmonale: Secondary | ICD-10-CM | POA: Diagnosis not present

## 2019-01-22 HISTORY — PX: PULMONARY THROMBECTOMY: CATH118295

## 2019-01-22 LAB — SARS CORONAVIRUS 2 BY RT PCR (HOSPITAL ORDER, PERFORMED IN ~~LOC~~ HOSPITAL LAB): SARS Coronavirus 2: NEGATIVE

## 2019-01-22 LAB — CBC WITH DIFFERENTIAL/PLATELET
Abs Immature Granulocytes: 0.03 10*3/uL (ref 0.00–0.07)
Basophils Absolute: 0.1 10*3/uL (ref 0.0–0.1)
Basophils Relative: 1 %
Eosinophils Absolute: 0.1 10*3/uL (ref 0.0–0.5)
Eosinophils Relative: 1 %
HCT: 43.3 % (ref 39.0–52.0)
Hemoglobin: 14.8 g/dL (ref 13.0–17.0)
Immature Granulocytes: 0 %
Lymphocytes Relative: 30 %
Lymphs Abs: 2.6 10*3/uL (ref 0.7–4.0)
MCH: 29.3 pg (ref 26.0–34.0)
MCHC: 34.2 g/dL (ref 30.0–36.0)
MCV: 85.7 fL (ref 80.0–100.0)
Monocytes Absolute: 0.8 10*3/uL (ref 0.1–1.0)
Monocytes Relative: 9 %
Neutro Abs: 5.1 10*3/uL (ref 1.7–7.7)
Neutrophils Relative %: 59 %
Platelets: 188 10*3/uL (ref 150–400)
RBC: 5.05 MIL/uL (ref 4.22–5.81)
RDW: 13.1 % (ref 11.5–15.5)
WBC: 8.6 10*3/uL (ref 4.0–10.5)
nRBC: 0 % (ref 0.0–0.2)

## 2019-01-22 LAB — BLOOD GAS, VENOUS
Acid-base deficit: 0.5 mmol/L (ref 0.0–2.0)
Bicarbonate: 20.1 mmol/L (ref 20.0–28.0)
O2 Saturation: 82.9 %
Patient temperature: 37
pCO2, Ven: 23 mmHg — ABNORMAL LOW (ref 44.0–60.0)
pH, Ven: 7.55 — ABNORMAL HIGH (ref 7.250–7.430)
pO2, Ven: 40 mmHg (ref 32.0–45.0)

## 2019-01-22 LAB — BASIC METABOLIC PANEL
Anion gap: 13 (ref 5–15)
BUN: 29 mg/dL — ABNORMAL HIGH (ref 6–20)
CO2: 18 mmol/L — ABNORMAL LOW (ref 22–32)
Calcium: 9.6 mg/dL (ref 8.9–10.3)
Chloride: 106 mmol/L (ref 98–111)
Creatinine, Ser: 0.94 mg/dL (ref 0.61–1.24)
GFR calc Af Amer: >60 mL/min (ref >60)
GFR calc non Af Amer: >60 mL/min (ref >60)
Glucose, Bld: 144 mg/dL — ABNORMAL HIGH (ref 70–99)
Potassium: 3.1 mmol/L — ABNORMAL LOW (ref 3.5–5.1)
Sodium: 137 mmol/L (ref 135–145)

## 2019-01-22 LAB — BRAIN NATRIURETIC PEPTIDE: B Natriuretic Peptide: 145 pg/mL — ABNORMAL HIGH (ref 0.0–100.0)

## 2019-01-22 LAB — TROPONIN I (HIGH SENSITIVITY): Troponin I (High Sensitivity): 57 ng/L — ABNORMAL HIGH (ref <18)

## 2019-01-22 LAB — PROTIME-INR
INR: 1.1 (ref 0.8–1.2)
Prothrombin Time: 14.1 seconds (ref 11.4–15.2)

## 2019-01-22 LAB — GLUCOSE, CAPILLARY: Glucose-Capillary: 149 mg/dL — ABNORMAL HIGH (ref 70–99)

## 2019-01-22 LAB — APTT: aPTT: 26 seconds (ref 24–36)

## 2019-01-22 SURGERY — PULMONARY THROMBECTOMY
Anesthesia: Moderate Sedation | Laterality: Bilateral

## 2019-01-22 MED ORDER — CYCLOBENZAPRINE HCL 10 MG PO TABS: 10.0000 mg | ORAL_TABLET | Freq: Every day | ORAL | Status: DC | PRN

## 2019-01-22 MED ORDER — MIDAZOLAM HCL 2 MG/2ML IJ SOLN
INTRAMUSCULAR | Status: AC
  Filled 2019-01-22: qty 2

## 2019-01-22 MED ORDER — HEPARIN SODIUM (PORCINE) 1000 UNIT/ML IJ SOLN
INTRAMUSCULAR | Status: AC
  Filled 2019-01-22: qty 1

## 2019-01-22 MED ORDER — SERTRALINE HCL 50 MG PO TABS
50.0000 mg | ORAL_TABLET | Freq: Every day | ORAL | Status: DC
  Administered 2019-01-22: 50 mg via ORAL
  Filled 2019-01-22: qty 1

## 2019-01-22 MED ORDER — MORPHINE SULFATE (PF) 4 MG/ML IV SOLN: 2.0000 mg | INTRAVENOUS | Status: DC | PRN

## 2019-01-22 MED ORDER — SODIUM CHLORIDE 0.9 % IV SOLN: INTRAVENOUS | Status: DC

## 2019-01-22 MED ORDER — ASPIRIN EC 81 MG PO TBEC
81.0000 mg | DELAYED_RELEASE_TABLET | Freq: Every day | ORAL | Status: DC
  Administered 2019-01-23: 81 mg via ORAL
  Filled 2019-01-22: qty 1

## 2019-01-22 MED ORDER — ACETAMINOPHEN 325 MG PO TABS: 650.0000 mg | ORAL_TABLET | ORAL | Status: DC | PRN

## 2019-01-22 MED ORDER — SODIUM CHLORIDE 0.9 % IV SOLN
Freq: Once | INTRAVENOUS | Status: AC
  Administered 2019-01-22: 18:00:00 via INTRAVENOUS

## 2019-01-22 MED ORDER — HEPARIN (PORCINE) 25000 UT/250ML-% IV SOLN
1550.0000 [IU]/h | INTRAVENOUS | Status: DC
  Administered 2019-01-23: 1650 [IU]/h via INTRAVENOUS
  Filled 2019-01-22: qty 250

## 2019-01-22 MED ORDER — FENTANYL CITRATE (PF) 100 MCG/2ML IJ SOLN
INTRAMUSCULAR | Status: AC
  Filled 2019-01-22: qty 2

## 2019-01-22 MED ORDER — IOHEXOL 350 MG/ML SOLN
75.0000 mL | Freq: Once | INTRAVENOUS | Status: AC | PRN
  Administered 2019-01-22: 75 mL via INTRAVENOUS

## 2019-01-22 MED ORDER — ONDANSETRON HCL 4 MG/2ML IJ SOLN: 4.0000 mg | Freq: Four times a day (QID) | INTRAMUSCULAR | Status: DC | PRN

## 2019-01-22 MED ORDER — CEFAZOLIN SODIUM-DEXTROSE 2-4 GM/100ML-% IV SOLN
2.0000 g | Freq: Once | INTRAVENOUS | Status: AC
  Administered 2019-01-22: 2 g via INTRAVENOUS

## 2019-01-22 MED ORDER — ROPINIROLE HCL 1 MG PO TABS
2.0000 mg | ORAL_TABLET | Freq: Every day | ORAL | Status: DC
  Administered 2019-01-23: 2 mg via ORAL
  Filled 2019-01-22: qty 2

## 2019-01-22 MED ORDER — SIMVASTATIN 10 MG PO TABS
20.0000 mg | ORAL_TABLET | Freq: Every day | ORAL | Status: DC
  Administered 2019-01-22: 20 mg via ORAL
  Filled 2019-01-22 (×2): qty 2

## 2019-01-22 MED ORDER — MIDAZOLAM HCL 2 MG/2ML IJ SOLN
INTRAMUSCULAR | Status: AC
  Filled 2019-01-22: qty 4

## 2019-01-22 MED ORDER — ALPRAZOLAM 0.5 MG PO TABS
0.5000 mg | ORAL_TABLET | Freq: Every evening | ORAL | Status: DC | PRN
  Administered 2019-01-23: 0.5 mg via ORAL
  Filled 2019-01-22: qty 1

## 2019-01-22 MED ORDER — POTASSIUM CHLORIDE CRYS ER 20 MEQ PO TBCR
40.0000 meq | EXTENDED_RELEASE_TABLET | ORAL | Status: AC
  Administered 2019-01-22 – 2019-01-23 (×2): 40 meq via ORAL
  Filled 2019-01-22 (×2): qty 2

## 2019-01-22 MED ORDER — SODIUM CHLORIDE 0.9 % IV SOLN: 250.0000 mL | INTRAVENOUS | Status: DC | PRN

## 2019-01-22 MED ORDER — FENTANYL CITRATE (PF) 100 MCG/2ML IJ SOLN
INTRAMUSCULAR | Status: DC | PRN
  Administered 2019-01-22: 25 ug via INTRAVENOUS
  Administered 2019-01-22 (×2): 50 ug via INTRAVENOUS
  Administered 2019-01-22: 25 ug via INTRAVENOUS

## 2019-01-22 MED ORDER — TRIAMCINOLONE ACETONIDE 55 MCG/ACT NA AERO
2.0000 | INHALATION_SPRAY | Freq: Every day | NASAL | Status: DC
  Administered 2019-01-23: 2 via NASAL
  Filled 2019-01-22: qty 21.6

## 2019-01-22 MED ORDER — OXYCODONE HCL 5 MG PO TABS: 5.0000 mg | ORAL_TABLET | ORAL | Status: DC | PRN

## 2019-01-22 MED ORDER — ONDANSETRON HCL 4 MG/2ML IJ SOLN
4.0000 mg | Freq: Once | INTRAMUSCULAR | Status: AC
  Administered 2019-01-22: 4 mg via INTRAVENOUS
  Filled 2019-01-22: qty 2

## 2019-01-22 MED ORDER — SODIUM CHLORIDE 0.9 % IV SOLN
Freq: Once | INTRAVENOUS | Status: AC
  Administered 2019-01-22: 16:00:00 via INTRAVENOUS

## 2019-01-22 MED ORDER — HEPARIN (PORCINE) 25000 UT/250ML-% IV SOLN
1650.0000 [IU]/h | INTRAVENOUS | Status: DC
  Administered 2019-01-22: 1650 [IU]/h via INTRAVENOUS
  Filled 2019-01-22: qty 250

## 2019-01-22 MED ORDER — LORAZEPAM 2 MG/ML IJ SOLN
1.0000 mg | Freq: Once | INTRAMUSCULAR | Status: AC
  Administered 2019-01-22: 1 mg via INTRAVENOUS
  Filled 2019-01-22: qty 1

## 2019-01-22 MED ORDER — SODIUM CHLORIDE 0.9% FLUSH: 3.0000 mL | INTRAVENOUS | Status: DC | PRN

## 2019-01-22 MED ORDER — PANTOPRAZOLE SODIUM 40 MG PO TBEC
40.0000 mg | DELAYED_RELEASE_TABLET | Freq: Every day | ORAL | Status: DC
  Administered 2019-01-23: 40 mg via ORAL
  Filled 2019-01-22: qty 1

## 2019-01-22 MED ORDER — MELOXICAM 7.5 MG PO TABS
15.0000 mg | ORAL_TABLET | Freq: Every day | ORAL | Status: DC
  Administered 2019-01-23: 15 mg via ORAL
  Filled 2019-01-22: qty 2

## 2019-01-22 MED ORDER — VITAMIN D 25 MCG (1000 UNIT) PO TABS
1000.0000 [IU] | ORAL_TABLET | Freq: Every day | ORAL | Status: DC
  Administered 2019-01-23: 1000 [IU] via ORAL
  Filled 2019-01-22: qty 1

## 2019-01-22 MED ORDER — HEPARIN BOLUS VIA INFUSION
5000.0000 [IU] | Freq: Once | INTRAVENOUS | Status: AC
  Administered 2019-01-22: 5000 [IU] via INTRAVENOUS
  Filled 2019-01-22: qty 5000

## 2019-01-22 MED ORDER — HYDROCHLOROTHIAZIDE 25 MG PO TABS
25.0000 mg | ORAL_TABLET | Freq: Every day | ORAL | Status: DC
  Administered 2019-01-23: 25 mg via ORAL
  Filled 2019-01-22: qty 1

## 2019-01-22 MED ORDER — MIDAZOLAM HCL 2 MG/2ML IJ SOLN
INTRAMUSCULAR | Status: DC | PRN
  Administered 2019-01-22 (×2): 1 mg via INTRAVENOUS
  Administered 2019-01-22: 2 mg via INTRAVENOUS
  Administered 2019-01-22: 0.5 mg via INTRAVENOUS

## 2019-01-22 MED ORDER — SODIUM CHLORIDE 0.9% FLUSH
3.0000 mL | Freq: Two times a day (BID) | INTRAVENOUS | Status: DC
  Administered 2019-01-23 (×2): 3 mL via INTRAVENOUS

## 2019-01-22 MED ORDER — ALTEPLASE 2 MG IJ SOLR
INTRAMUSCULAR | Status: DC | PRN
  Administered 2019-01-22: 12 mg

## 2019-01-22 MED ORDER — MAGNESIUM OXIDE 400 (241.3 MG) MG PO TABS
400.0000 mg | ORAL_TABLET | Freq: Every day | ORAL | Status: DC
  Administered 2019-01-23: 400 mg via ORAL
  Filled 2019-01-22: qty 1

## 2019-01-22 SURGICAL SUPPLY — 18 items
CANISTER PENUMBRA ENGINE (MISCELLANEOUS) ×2 IMPLANT
CATH ANGIO 5F 100CM .035 PIG (CATHETERS) ×2 IMPLANT
CATH INDIGO 12XTORQ 100 (CATHETERS) ×2 IMPLANT
CATH INDIGO SEP 12 (CATHETERS) ×2 IMPLANT
CATH INFINITI JR4 5F (CATHETERS) ×2 IMPLANT
CATH SELECT BERN TIP 5F 130 (CATHETERS) ×2 IMPLANT
COVER PROBE U/S 5X48 (MISCELLANEOUS) ×2 IMPLANT
DEVICE SAFEGUARD 24CM (GAUZE/BANDAGES/DRESSINGS) ×2 IMPLANT
GLIDEWIRE ADV .035X260CM (WIRE) ×2 IMPLANT
INTRODUCER PERFORM 12 30 .038 (SHEATH) ×2 IMPLANT
NDL ENTRY 21GA 7CM ECHOTIP (NEEDLE) IMPLANT
NEEDLE ENTRY 21GA 7CM ECHOTIP (NEEDLE) ×3 IMPLANT
PACK ANGIOGRAPHY (CUSTOM PROCEDURE TRAY) ×3 IMPLANT
SET INTRO CAPELLA COAXIAL (SET/KITS/TRAYS/PACK) ×2 IMPLANT
SHEATH BRITE TIP 5FRX11 (SHEATH) ×2 IMPLANT
SYR MEDRAD MARK 7 150ML (SYRINGE) ×2 IMPLANT
TUBING CONTRAST HIGH PRESS 72 (TUBING) ×3 IMPLANT
WIRE J 3MM .035X145CM (WIRE) ×2 IMPLANT

## 2019-01-22 NOTE — ED Provider Notes (Signed)
Official CT report IMPRESSION: 1. Acute large clot burden bilateral pulmonary emboli with CT evidence of RIGHT heart strain. 2. Mild dependent/bibasilar atelectasis. 3. Coronary artery disease.  Small pericardial effusion. 4. Question hepatic steatosis.  Critical Value/emergent results were called by telephone at the time of interpretation on 01/22/2019 at 5:29 pm to [facility] , who verbally acknowledged these results.  Patient is being taken to the vascular procedure lab for thrombectomy.   Earleen Newport, MD 01/22/19 [phone]

## 2019-01-22 NOTE — Consult Note (Signed)
ANTICOAGULATION CONSULT NOTE - Initial Consult  Pharmacy Consult for Heparin Infusion Dosing Indication: pulmonary embolus  No Known Allergies  Patient Measurements: Height: 6\' 1"  (185.4 cm) Weight: 220 lb (99.8 kg) IBW/kg (Calculated) : 79.9  Vital Signs: Temp: 96 F (35.6 C) (10/08 1534) Temp Source: Axillary (10/08 1534) BP: 107/73 (10/08 1955) Pulse Rate: 100 (10/08 2012)  Labs: Recent Labs    01/22/19 1538 01/22/19 1706  HGB 14.8  --   HCT 43.3  --   PLT 188  --   APTT  --  26  LABPROT  --  14.1  INR  --  1.1  CREATININE 0.94  --   TROPONINIHS 57*  --     Estimated Creatinine Clearance: 103.9 mL/min (by C-G formula based on SCr of 0.94 mg/dL).   Medical History: Past Medical History:  Diagnosis Date  . 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 GERD (gastroesophageal reflux disease)   . Sleep apnea    no cpap x 8 years    Assessment: Pharmacy consulted for heparin infusion dosing and monitoring for 58 yo male admitted with PE. CT Chest shows acute large clot burden bilateral pulmonary emboli with CT evidence of RIGHT heart strain.  No reported anticoagulants PTA.   Hgb: 14.8    Hct: 43.3   Plts: 188    Troponin I (HS): 57   10/8 2000 Patient S/P Catheter placement and mechanical thrombectomy. Pharmacy consulted for heparin restart to begin 8 hr post sheath removal. Sheath removal documented @ 19:41  10/8:  Dr Delana Meyer called @ 20:30 and wanted to resume heparin at previous rate right away.    Goal of Therapy:   Heparin level 0.3-0.7 units/ml Monitor platelets by anticoagulation protocol: Yes   Plan:  Start heparin infusion at 1650 units/hr 10/8 @ 20:30.  Check anti-Xa level 6 hours after infusion starts and daily while on heparin Continue to monitor H&H and platelets. CBCs ordered daily while on heparin.   Orene Desanctis, PharmD Clinical Pharmacist 01/22/2019 8:30 PM

## 2019-01-22 NOTE — ED Notes (Signed)
Report given to OR nurse

## 2019-01-22 NOTE — ED Notes (Addendum)
Pt and wife given food tray and drink per diet order. Pt educated to eat cautiously and only as tolerated.

## 2019-01-22 NOTE — ED Notes (Addendum)
Pt A&Ox4. Wife at bedside. Currently on 4L; will slowly dec to room air as pt tolerates.

## 2019-01-22 NOTE — Op Note (Signed)
[facility]  Percutaneous Study/Intervention Procedural Note   Date of Surgery: 01/22/2019,7:52 PM  Surgeon:Jun Osment, Dolores Lory   Pre-operative Diagnosis: Symptomatic saddle pulmonary emboli  Post-operative diagnosis:  Same  Procedure(s) Performed:  1.  Contrast injection right heart  2.  Selective catheter placement right upper, middle and lower lobes  3.  Selective catheter placement left lower lobe  4.  Thrombolysis left and right lobar pulmonary arteries  5.   Mechanical thrombectomy with the penumbra CAT 12 right upper, middle and lower lobe pulmonary arteries and left lower lobe pulmonary artery     Anesthesia: Conscious sedation was administered under my direct supervision by the interventional radiology RN. IV Versed plus fentanyl were utilized. Continuous ECG, pulse oximetry and blood pressure was monitored throughout the entire procedure.  Versed and fentanyl were administered intravenously.  Conscious sedation was administered for a total of 80 minutes.  Sheath: 12 French 30 cc antegrade right common femoral vein  Contrast: 150 cc   Fluoroscopy Time: 15.6 minutes  Indications:  Patient presents with bilateral massive pulmonary emboli. The patient is symptomatic with hypoxemia and dyspnea on exertion.  There is evidence of right heart strain on the CT angiogram. The patient is otherwise a good candidate for intervention and even the long-term benefits pulmonary angiography with thrombolysis is offered. The risks and benefits are reviewed long-term benefits are discussed. All questions are answered patient agrees to proceed.  Procedure:  Samnang Schofieldis a 58 y.o. male who was identified and appropriate procedural time out was performed.  The patient was then placed supine on the table and prepped and draped in the usual sterile fashion.  Ultrasound was used to evaluate the right common femoral vein.  It was patent, as it was echolucent and compressible.   A digital ultrasound image was acquired for the permanent record.  A micropuncture needle was used to access the right common femoral vein under direct ultrasound guidance.  A microwire was then advanced under fluoroscopic guidance followed by micro-sheath.  A 0.035 J wire was advanced without resistance and a 5Fr sheath was placed and then upsized to an 8 [place] sheath.    The wire and pigtail catheter were then negotiated into the right atrium and bolus injection of contrast was utilized to demonstrate the right ventricle and the pulmonary artery outflow. The wire and catheter were then negotiated into the main pulmonary artery where hand injection of contrast was utilized to demonstrate the pulmonary arteries and confirm the locations of the pulmonary emboli.  2000 units of heparin was then given and allowed to circulate.  TPA was reconstituted and delivered onto the table. A total of 12 milligrams of TPA was utilized and delivered through the diagnostic catheter.  Initially, 4 was administered on the left side and then 8 milligrams was administered on the right side. This was then allowed to dwell for 20-30 minutes.  The Penumbra Cat 12 catheter was then advanced up into the pulmonary vasculature. The right lung was addressed first. Catheter was negotiated into the right middle lobe and mechanical thrombectomy was performed. Follow-up imaging demonstrated a good result and therefore the catheter was renegotiated into the right upper lobe pulmonary artery and again mechanical thrombectomy was performed.  Lastly the right the lower lobe was addressed.  Passes were made with both the Penumbra catheter itself as well as introducing the separator. Follow-up imaging was then performed which showed near total resolution of the thrombus.  The Penumbra Cat 12 catheter was  then negotiated to the left side. The left lung was addressed. Catheter was negotiated into the left lower lobe and mechanical thrombectomy  was performed. Follow-up imaging demonstrated a good result and the catheter is pulled into the main pulmonary artery and a 30 cc injection of contrast was used to perform the final image.  After review these images wires reintroduced and the rectal catheters removed the sheath is then pulled and pressures held. A safeguard is placed.    Findings:   Right heart imaging:  Right atrium and right ventricle and the pulmonary outflow tract appears normal  Right lung: Demonstrates thrombus within the main right pulmonary artery as well as near occlusive thrombus within the right upper middle and lower lobe pulmonary arteries.  There is very poor distal flow initially.  Following treatment with the penumbra CAT 12 catheter there is now resolution of almost all the thrombus with filling out to the periphery of the long of all 3 lobes.  Left lung: Demonstrates thrombus within the left main pulmonary artery as well as near occlusive thrombus in the left lower lobe.  The left upper lobe fills fairly well.  Following mechanical thrombectomy there is now filling distally all the way to the periphery of the lung with near total resolution of the thrombus.    Disposition: Patient was taken to the recovery room in stable condition having tolerated the procedure well.  Belenda Cruise Nader Boys 01/22/2019,7:52 PM

## 2019-01-22 NOTE — Consult Note (Signed)
ANTICOAGULATION CONSULT NOTE - Initial Consult  Pharmacy Consult for Heparin Infusion Dosing Indication: pulmonary embolus  No Known Allergies  Patient Measurements: Height: 6\' 1"  (185.4 cm) Weight: 220 lb (99.8 kg) IBW/kg (Calculated) : 79.9  Vital Signs: Temp: 96 F (35.6 C) (10/08 1534) Temp Source: Axillary (10/08 1534) BP: 107/73 (10/08 1955) Pulse Rate: 100 (10/08 1955)  Labs: Recent Labs    01/22/19 1538 01/22/19 1706  HGB 14.8  --   HCT 43.3  --   PLT 188  --   APTT  --  26  LABPROT  --  14.1  INR  --  1.1  CREATININE 0.94  --   TROPONINIHS 57*  --     Estimated Creatinine Clearance: 103.9 mL/min (by C-G formula based on SCr of 0.94 mg/dL).   Medical History: Past Medical History:  Diagnosis Date  . 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 GERD (gastroesophageal reflux disease)   . Sleep apnea    no cpap x 8 years    Assessment: Pharmacy consulted for heparin infusion dosing and monitoring for 58 yo male admitted with PE. CT Chest shows acute large clot burden bilateral pulmonary emboli with CT evidence of RIGHT heart strain.  No reported anticoagulants PTA.   Hgb: 14.8    Hct: 43.3   Plts: 188    Troponin I (HS): 57   10/8 2000 Patient S/P Catheter placement and mechanical thrombectomy. Pharmacy consulted for heparin restart to begin 8 hr post sheath removal. Sheath removal documented @ 19:41  Goal of Therapy:   Heparin level 0.3-0.7 units/ml Monitor platelets by anticoagulation protocol: Yes   Plan:  Start heparin infusion at 1650 units/hr 10/9 @ 0340 Check anti-Xa level 6 hours after infusion starts and daily while on heparin Continue to monitor H&H and platelets. CBCs ordered daily while on heparin.   Pernell Dupre, PharmD, BCPS Clinical Pharmacist 01/22/2019 8:05 PM

## 2019-01-22 NOTE — ED Notes (Signed)
NS continues to run at 75cc/hr from surgery. Heparin restarted per surgeon's verbal order.

## 2019-01-22 NOTE — ED Notes (Signed)
CT notified pt has 20g R ac IV.

## 2019-01-22 NOTE — ED Notes (Signed)
Called lab to add-on aPTT/INR. Per lab blue tube is clotted so will send another.

## 2019-01-22 NOTE — ED Notes (Signed)
Per CT staff, pt must have 20g above wrist. Arby Barrette RN had tried for L fa earlier. This RN will try soon. Wife at bedside. Pt given warm blankets. Pt currently on 3L via [place].

## 2019-01-22 NOTE — ED Notes (Signed)
EDP Williams at bedside.  

## 2019-01-22 NOTE — H&P (Signed)
[facility] [place] NAME: Nathan Scott    MR#:  [id number]  DATE OF BIRTH:  08/24/1960  DATE OF ADMISSION:  01/22/2019  PRIMARY CARE PHYSICIAN: Rusty Aus, MD   REQUESTING/REFERRING PHYSICIAN: Delana Meyer, Dolores Lory, MD  CHIEF COMPLAINT:   Chief Complaint  Patient presents with  . Loss of Consciousness  . Shortness of Breath  . Emesis    HISTORY OF PRESENT ILLNESS: Nathan Scott  is a 58 y.o. male with a known history of  GERD and sleep apnea who presents with shortness of breath ongoing for the past few weeks.  Patient even had a cardiac cath that was negative.  Today he went to see the pulmonologist who did some blood work and asked him to follow back up.  He went home and was walking when he collapsed.  And brought to the emergency room.  He underwent a CT scan of the chest which showed saddle pulmonary embolus.  Patient was seen by vascular surgery underwent thrombolysis of the pulmonary arteries.  Patient currently denying any chest pain or shortness of breath.  Denies any recent travel, no recent trauma    PAST MEDICAL HISTORY:   Past Medical History:  Diagnosis Date  . 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 GERD (gastroesophageal reflux disease)   . Sleep apnea    no cpap x 8 years    PAST SURGICAL HISTORY:  Past Surgical History:  Procedure Laterality Date  . FRACTURE SURGERY Left    pinning leg fx  . LEFT HEART CATH AND CORONARY ANGIOGRAPHY Left 01/12/2019   Procedure: LEFT HEART CATH AND CORONARY ANGIOGRAPHY;  Surgeon: Teodoro Spray, MD;  Location: [facility];  Service: Cardiovascular;  Laterality: Left;  . NASAL SEPTOPLASTY W/ TURBINOPLASTY Bilateral 02/20/2016   Procedure: NASAL SEPTOPLASTY WITH TURBINATE REDUCTION;  Surgeon: Margaretha Sheffield, MD;  Location: [facility];  Service: ENT;  Laterality: Bilateral;    SOCIAL HISTORY:  Social History   Tobacco Use  . Smoking status: Never Smoker  . Smokeless tobacco: Never  Used  Substance Use Topics  . Alcohol use: No    FAMILY HISTORY: History of DVT in the father  DRUG ALLERGIES: No Known Allergies  REVIEW OF SYSTEMS:   CONSTITUTIONAL: No fever, fatigue or weakness.  EYES: No blurred or double vision.  EARS, NOSE, AND THROAT: No tinnitus or ear pain.  RESPIRATORY: No cough, shortness of breath, wheezing or hemoptysis.  CARDIOVASCULAR: No chest pain, orthopnea, edema.  GASTROINTESTINAL: No nausea, vomiting, diarrhea or abdominal pain.  GENITOURINARY: No dysuria, hematuria.  ENDOCRINE: No polyuria, nocturia,  HEMATOLOGY: No anemia, easy bruising or bleeding SKIN: No rash or lesion. MUSCULOSKELETAL: No joint pain or arthritis.  Right lower extremity swelling NEUROLOGIC: No tingling, numbness, weakness.  PSYCHIATRY: No anxiety or depression.   MEDICATIONS AT HOME:  Prior to Admission medications   Medication Sig Start Date End Date Taking? Authorizing Provider  Apple Cider Vinegar 500 MG TABS Take 3 capsules by mouth daily.    Yes [provider]  aspirin EC 81 MG tablet Take 81 mg by mouth daily.   Yes [provider]  ALPRAZolam Duanne Moron) 0.5 MG tablet Take 0.5 mg by mouth at bedtime.    [provider]  cholecalciferol (VITAMIN D) 25 MCG (1000 UT) tablet Take 1,000 Units by mouth daily.    [provider]  cyclobenzaprine (FLEXERIL) 10 MG tablet Take 10 mg by mouth at bedtime.  [provider]  Flaxseed, Linseed, (FLAXSEED OIL) 1200 MG CAPS Take 2,400 mg by mouth daily.    [provider]  Glucosamine-Chondroitin (COSAMIN DS PO) Take 1 tablet by mouth daily.    [provider]  hydrochlorothiazide (HYDRODIURIL) 25 MG tablet Take 25 mg by mouth daily. 07/04/15   [provider]  Magnesium Oxide 500 MG TABS Take 500 mg by mouth daily.    [provider]  Omega-3 Fatty Acids (FISH OIL) 1000 MG CAPS Take 2,000 mg by mouth daily.    [provider]  omeprazole  (PRILOSEC) 40 MG capsule Take 40 mg by mouth daily before breakfast. 11/12/15   [provider]  pyridOXINE (VITAMIN B-6) 100 MG tablet Take 100 mg by mouth daily.    [provider]  sertraline (ZOLOFT) 50 MG tablet Take 50 mg by mouth at bedtime.    [provider]  simvastatin (ZOCOR) 20 MG tablet Take 20 mg by mouth at bedtime.    [provider]  triamcinolone (NASACORT ALLERGY 24HR) 55 MCG/ACT AERO nasal inhaler Place 2 sprays into the nose daily.    [provider]  triamcinolone cream (KENALOG) 0.1 % Apply 1 application topically 2 (two) times daily as needed (for itching legs.).    [provider]  TURMERIC PO Take 538 mg by mouth daily.     [provider]      PHYSICAL EXAMINATION:   VITAL SIGNS: Blood pressure 123/87, pulse (!) 119, temperature (!) 96 F (35.6 C), temperature source Axillary, resp. rate (!) 22, height 6\' 1"  (1.854 m), weight 99.8 kg, SpO2 98 %.  GENERAL:  58 y.o.-year-old patient lying in the bed with no acute distress.  EYES: Pupils equal, round, reactive to light and accommodation. No scleral icterus. Extraocular muscles intact.  HEENT: Head atraumatic, normocephalic. Oropharynx and nasopharynx clear.  NECK:  Supple, no jugular venous distention. No thyroid enlargement, no tenderness.  LUNGS: Normal breath sounds bilaterally, no wheezing, rales,rhonchi or crepitation. No use of accessory muscles of respiration.  CARDIOVASCULAR: S1, S2 normal. No murmurs, rubs, or gallops.  ABDOMEN: Soft, nontender, nondistended. Bowel sounds present. No organomegaly or mass.  EXTREMITIES: Right leg is more swollen than the left leg NEUROLOGIC: Cranial nerves II through XII are intact. Muscle strength 5/5 in all extremities. Sensation intact. Gait not checked.  PSYCHIATRIC: The patient is alert and oriented x 3.  SKIN: No obvious rash, lesion, or ulcer.   LABORATORY PANEL:   CBC Recent Labs  Lab 01/22/19 1538   WBC 8.6  HGB 14.8  HCT 43.3  PLT 188  MCV 85.7  MCH 29.3  MCHC 34.2  RDW 13.1  LYMPHSABS 2.6  MONOABS 0.8  EOSABS 0.1  BASOSABS 0.1   ------------------------------------------------------------------------------------------------------------------  Chemistries  Recent Labs  Lab 01/22/19 1538  NA 137  K 3.1*  CL 106  CO2 18*  GLUCOSE 144*  BUN 29*  CREATININE 0.94  CALCIUM 9.6   ------------------------------------------------------------------------------------------------------------------ estimated creatinine clearance is 103.9 mL/min (by C-G formula based on SCr of 0.94 mg/dL). ------------------------------------------------------------------------------------------------------------------ No results for input(s): TSH, T4TOTAL, T3FREE, THYROIDAB in the last 72 hours.  Invalid input(s): FREET3   Coagulation profile Recent Labs  Lab 01/22/19 1706  INR 1.1   ------------------------------------------------------------------------------------------------------------------- No results for input(s): DDIMER in the last 72 hours. -------------------------------------------------------------------------------------------------------------------  Cardiac Enzymes No results for input(s): CKMB, TROPONINI, MYOGLOBIN in the last 168 hours.  Invalid input(s): CK ------------------------------------------------------------------------------------------------------------------ Invalid input(s): POCBNP  ---------------------------------------------------------------------------------------------------------------  Urinalysis No results found for:  COLORURINE, APPEARANCEUR, LABSPEC, PHURINE, GLUCOSEU, HGBUR, BILIRUBINUR, KETONESUR, PROTEINUR, UROBILINOGEN, NITRITE, LEUKOCYTESUR   RADIOLOGY: Ct Angio Chest Pe W And/or Wo Contrast  Result Date: 01/22/2019 CLINICAL DATA:  58 year old male with shortness of breath and syncope. EXAM: CT ANGIOGRAPHY CHEST WITH CONTRAST  TECHNIQUE: Multidetector CT imaging of the chest was performed using the standard protocol during bolus administration of intravenous contrast. Multiplanar CT image reconstructions and MIPs were obtained to evaluate the vascular anatomy. CONTRAST:  24mL OMNIPAQUE IOHEXOL 350 MG/ML SOLN COMPARISON:  01/22/2019 chest radiograph FINDINGS: Cardiovascular: This is a technically satisfactory study. Acute pulmonary emboli are noted at the bifurcation of the main pulmonary artery and extending into both main pulmonary arteries and multiple bilateral pulmonary segments. RV/LV ratio measures 1.3 compatible with CT evidence of RIGHT heart strain. A small pericardial effusion is noted. No thoracic aortic aneurysm. Coronary artery atherosclerotic calcifications are present. Mediastinum/Nodes: No enlarged mediastinal, hilar, or axillary lymph nodes. Thyroid gland, trachea, and esophagus demonstrate no significant findings. Lungs/Pleura: Mild dependent and bibasilar atelectasis noted. No airspace disease, consolidation, mass, pleural effusion or pneumothorax. Upper Abdomen: Question mild hepatic steatosis. No acute abnormality. Musculoskeletal: No acute or suspicious bony abnormality. Review of the MIP images confirms the above findings. IMPRESSION: 1. Acute large clot burden bilateral pulmonary emboli with CT evidence of RIGHT heart strain. 2. Mild dependent/bibasilar atelectasis. 3. Coronary artery disease.  Small pericardial effusion. 4. Question hepatic steatosis. Critical Value/emergent results were called by telephone at the time of interpretation on 01/22/2019 at 5:29 pm to [facility] , who verbally acknowledged these results. Electronically Signed   By: Margarette Canada M.D.   On: 01/22/2019 17:30   Dg Chest Port 1 View  Result Date: 01/22/2019 CLINICAL DATA:  Dyspnea, diaphoresis, syncopal episode EXAM: PORTABLE CHEST 1 VIEW COMPARISON:  08/23/2005 FINDINGS: The heart size and mediastinal contours are within  normal limits. Both lungs are clear. The visualized skeletal structures are unremarkable. IMPRESSION: No active disease. Electronically Signed   By: Kathreen Devoid   On: 01/22/2019 16:12    EKG: Orders placed or performed during the hospital encounter of 01/22/19  . ED EKG  . ED EKG  . EKG 12-Lead  . EKG 12-Lead    IMPRESSION AND PLAN: Patient 58 year old with GERD who is presenting with acute pulmonary embolism  1.  Acute pulmonary embolism Due to saddle pulmonary embolism treat with IV heparin We will obtain a hypercoagulable work-up Also obtain lower extremity Dopplers Continue heparin for now Transition to Eliquis as per vascular surgery  2.  Hypokalemia we will replace potassium   All the records are reviewed and case discussed with ED provider. Management plans discussed with the patient, family and they are in agreement.  CODE STATUS: Code Status History    Date Active Date Inactive Code Status Order ID Comments User Context   01/12/2019 1518 01/12/2019 1941 Full Code [id number]  Teodoro Spray, MD Inpatient   Advance Care Planning Activity       TOTAL TIME TAKING CARE OF THIS PATIENT:66minutes.    Dustin Flock M.D on 01/22/2019 at 6:07 PM  Between 7am to 6pm - Pager - [phone]  After 6pm go to [email or web address] - password [email or web address]  [employer] Office  [phone]  CC: Primary care physician; Rusty Aus, MD

## 2019-01-22 NOTE — ED Notes (Signed)
EKG completed. To EDP Williams.

## 2019-01-22 NOTE — Consult Note (Signed)
ANTICOAGULATION CONSULT NOTE - Initial Consult  Pharmacy Consult for Heparin Infusion Dosing Indication: pulmonary embolus  No Known Allergies  Patient Measurements: Height: 6\' 1"  (185.4 cm) Weight: 220 lb (99.8 kg) IBW/kg (Calculated) : 79.9  Vital Signs: Temp: 96 F (35.6 C) (10/08 1534) Temp Source: Axillary (10/08 1534) BP: 115/90 (10/08 1630) Pulse Rate: 110 (10/08 1702)  Labs: Recent Labs    01/22/19 1538  HGB 14.8  HCT 43.3  PLT 188  CREATININE 0.94  TROPONINIHS 57*    Estimated Creatinine Clearance: 103.9 mL/min (by C-G formula based on SCr of 0.94 mg/dL).   Medical History: Past Medical History:  Diagnosis Date  . 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 GERD (gastroesophageal reflux disease)   . Sleep apnea    no cpap x 8 years    Assessment: Pharmacy consulted for heparin infusion dosing and monitoring for 58 yo male admitted with PE. CT Chest shows acute large clot burden bilateral pulmonary emboli with CT evidence of RIGHT heart strain.  No reported anticoagulants PTA.   Hgb: 14.8    Hct: 43.3   Plts: 188    Troponin I (HS): 57   Goal of Therapy:   Heparin level 0.3-0.7 units/ml Monitor platelets by anticoagulation protocol: Yes   Plan:  Baseline labs ordered Give 5000 units bolus x 1 Start heparin infusion at 1650 units/hr Check anti-Xa level in 6 hours and daily while on heparin Continue to monitor H&H and platelets  Pernell Dupre, PharmD, BCPS Clinical Pharmacist 01/22/2019 5:11 PM

## 2019-01-22 NOTE — ED Notes (Signed)
Pt changing into gown; removing all jewelry, etc. Wife remains at bedside. Will transport pt soon.

## 2019-01-22 NOTE — ED Triage Notes (Signed)
Pt in via EMS from home d/t syncopal episode. Pt diaphoretic, vomiting, and SOB x3 weeks. 16g R hand IV by EMS. A&Ox4. 90% on RA. Brought in on 3L [place]. 120/70 BP.

## 2019-01-22 NOTE — ED Notes (Signed)
2nd blue tube sent to lab.

## 2019-01-22 NOTE — ED Notes (Signed)
Pt given ice chips verbal okay from [place].

## 2019-01-22 NOTE — ED Notes (Signed)
Pt given another cup of ice chips verbal okay from [place].

## 2019-01-22 NOTE — ED Provider Notes (Signed)
[facility] Emergency Department Provider Note       Time seen: ----------------------------------------- 3:34 PM on 01/22/2019 -----------------------------------------  Level V caveat: History/ROS limited by altered mental status I have reviewed the triage vital signs and the nursing notes.  HISTORY   Chief Complaint Loss of Consciousness, Shortness of Breath, and Emesis    HPI Nathan Scott is a 58 y.o. male with a history of edema, GERD, sleep apnea who presents to the ED for syncope.  Patient arrives from home due to a syncopal episode.  He arrives with diaphoresis and vomiting.  He has had shortness of breath of the past 3 weeks.  He was only 90% on room air, brought in on 3 L nasal cannula oxygen with a low end-tidal CO2.   Past Medical History:  Diagnosis Date  . 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 GERD (gastroesophageal reflux disease)   . Sleep apnea    no cpap x 8 years    There are no active problems to display for this patient.   Past Surgical History:  Procedure Laterality Date  . FRACTURE SURGERY Left    pinning leg fx  . LEFT HEART CATH AND CORONARY ANGIOGRAPHY Left 01/12/2019   Procedure: LEFT HEART CATH AND CORONARY ANGIOGRAPHY;  Surgeon: Teodoro Spray, MD;  Location: [place] [facility];  Service: Cardiovascular;  Laterality: Left;  . NASAL SEPTOPLASTY W/ TURBINOPLASTY Bilateral 02/20/2016   Procedure: NASAL SEPTOPLASTY WITH TURBINATE REDUCTION;  Surgeon: Margaretha Sheffield, MD;  Location: [facility];  Service: ENT;  Laterality: Bilateral;    Allergies Patient has no known allergies.  Social History Social History   Tobacco Use  . Smoking status: Never Smoker  . Smokeless tobacco: Never Used  Substance Use Topics  . Alcohol use: No  . Drug use: No    Review of Systems Constitutional: Negative for fever. Cardiovascular: Negative for chest pain. Respiratory: Negative for shortness of breath. Gastrointestinal: Negative for  abdominal pain, positive for vomiting Musculoskeletal: Negative for back pain. Skin: Positive for diaphoresis Neurological: Negative for headaches, positive for weakness  All systems negative/normal/unremarkable except as stated in the HPI  ____________________________________________   PHYSICAL EXAM:  VITAL SIGNS: ED Triage Vitals  Enc Vitals Group     BP      Pulse      Resp      Temp      Temp src      SpO2      Weight      Height      Head Circumference      Peak Flow      Pain Score      Pain Loc      Pain Edu?      Excl. in [place]?     Constitutional: Lethargic but responds easily to verbal stimuli, moderate distress, ill-appearing  Eyes: Conjunctivae are normal. Normal extraocular movements. ENT      Head: Normocephalic and atraumatic.      Nose: No congestion/rhinnorhea.      Mouth/Throat: Mucous membranes are moist.      Neck: No stridor. Cardiovascular: Normal rate, regular rhythm. No murmurs, rubs, or gallops. Respiratory: Normal respiratory effort without tachypnea nor retractions. Breath sounds are clear and equal bilaterally. No wheezes/rales/rhonchi. Gastrointestinal: Soft and nontender. Normal bowel sounds Musculoskeletal: Nontender with normal range of motion in extremities. No lower extremity tenderness nor edema. Neurologic:  Normal speech and language. No gross focal neurologic deficits are appreciated.  Generalized  weakness Skin:  Skin is cool, pallor and diaphoresis are noted Psychiatric: Distressed ____________________________________________  EKG: Interpreted by me.  Sinus tachycardia with a rate of 112 bpm, low voltage, long QT, normal axis  ____________________________________________  ED COURSE:  As part of my medical decision making, I reviewed the following data within the [place] History obtained from family if available, nursing notes, old chart and ekg, as well as notes from prior ED visits. Patient presented for  syncope, we will assess with labs and imaging as indicated at this time.   Procedures  Lamichael Scott was evaluated in Emergency Department on 01/22/2019 for the symptoms described in the history of present illness. He was evaluated in the context of the global COVID-19 pandemic, which necessitated consideration that the patient might be at risk for infection with the SARS-CoV-2 virus that causes COVID-19. Institutional protocols and algorithms that pertain to the evaluation of patients at risk for COVID-19 are in a state of rapid change based on information released by regulatory bodies including the CDC and federal and state organizations. These policies and algorithms were followed during the patient's care in the ED.  ____________________________________________   LABS (pertinent positives/negatives)  Labs Reviewed  BASIC METABOLIC PANEL - Abnormal; Notable for the following components:      Result Value   Potassium 3.1 (*)    CO2 18 (*)    Glucose, Bld 144 (*)    BUN 29 (*)    All other components within normal limits  BRAIN NATRIURETIC PEPTIDE - Abnormal; Notable for the following components:   B Natriuretic Peptide 145.0 (*)    All other components within normal limits  BLOOD GAS, VENOUS - Abnormal; Notable for the following components:   pH, Ven 7.55 (*)    pCO2, Ven 23 (*)    All other components within normal limits  GLUCOSE, CAPILLARY - Abnormal; Notable for the following components:   Glucose-Capillary 149 (*)    All other components within normal limits  TROPONIN I (HIGH SENSITIVITY) - Abnormal; Notable for the following components:   Troponin I (High Sensitivity) 57 (*)    All other components within normal limits  SARS CORONAVIRUS 2 BY RT PCR (HOSPITAL ORDER, [facility])  CBC WITH DIFFERENTIAL/PLATELET  APTT  PROTIME-INR   CRITICAL CARE Performed by: Laurence Aly   Total critical care time: 30 minutes  Critical care time was  exclusive of separately billable procedures and treating other patients.  Critical care was necessary to treat or prevent imminent or life-threatening deterioration.  Critical care was time spent personally by me on the following activities: development of treatment plan with patient and/or surrogate as well as nursing, discussions with consultants, evaluation of patient's response to treatment, examination of patient, obtaining history from patient or surrogate, ordering and performing treatments and interventions, ordering and review of laboratory studies, ordering and review of radiographic studies, pulse oximetry and re-evaluation of patient's condition.  RADIOLOGY Images were viewed by me Chest x-ray Was unremarkable CTA chest Reveals large saddle pulmonary embolus ____________________________________________   DIFFERENTIAL DIAGNOSIS   Arrhythmia, MI, PE, dissection, pneumothorax, anemia, sepsis  FINAL ASSESSMENT AND PLAN  Syncope, pulmonary embolism   Plan: The patient had presented for syncope. Patient's labs initially resembled more of a panic attack with high pH and low PaCO2.  Troponin was slightly elevated which was interesting considering his recent negative cardiac cath. Patient's imaging was negative regarding his chest x-ray but his CTA chest revealed  a saddle pulmonary embolus.  I discussed with vascular surgery and have ordered heparin.  Dr. Delana Meyer from vascular surgery is coming to evaluate the patient in the ER.  Patient's vital signs are remarkably stable at this point.   Laurence Aly, MD    Note: This note was generated in part or whole with voice recognition software. Voice recognition is usually quite accurate but there are transcription errors that can and very often do occur. I apologize for any typographical errors that were not detected and corrected.     Earleen Newport, MD 01/22/19 [phone]

## 2019-01-22 NOTE — ED Notes (Signed)
Pt's wife given 2nd gingerale as requested.

## 2019-01-22 NOTE — ED Notes (Signed)
Rainbow sent to lab. Lactic and VBG on ice.

## 2019-01-23 ENCOUNTER — Inpatient Hospital Stay: Payer: Commercial Managed Care - PPO

## 2019-01-23 DIAGNOSIS — I2692 Saddle embolus of pulmonary artery without acute cor pulmonale: Secondary | ICD-10-CM

## 2019-01-23 LAB — CBC
HCT: 34 % — ABNORMAL LOW (ref 39.0–52.0)
Hemoglobin: 11.6 g/dL — ABNORMAL LOW (ref 13.0–17.0)
MCH: 29.7 pg (ref 26.0–34.0)
MCHC: 34.1 g/dL (ref 30.0–36.0)
MCV: 87.2 fL (ref 80.0–100.0)
Platelets: 126 10*3/uL — ABNORMAL LOW (ref 150–400)
RBC: 3.9 MIL/uL — ABNORMAL LOW (ref 4.22–5.81)
RDW: 13.3 % (ref 11.5–15.5)
WBC: 6.7 10*3/uL (ref 4.0–10.5)
nRBC: 0 % (ref 0.0–0.2)

## 2019-01-23 LAB — BASIC METABOLIC PANEL
Anion gap: 5 (ref 5–15)
BUN: 25 mg/dL — ABNORMAL HIGH (ref 6–20)
CO2: 25 mmol/L (ref 22–32)
Calcium: 8 mg/dL — ABNORMAL LOW (ref 8.9–10.3)
Chloride: 106 mmol/L (ref 98–111)
Creatinine, Ser: 0.94 mg/dL (ref 0.61–1.24)
GFR calc Af Amer: >60 mL/min (ref >60)
GFR calc non Af Amer: >60 mL/min (ref >60)
Glucose, Bld: 114 mg/dL — ABNORMAL HIGH (ref 70–99)
Potassium: 3.9 mmol/L (ref 3.5–5.1)
Sodium: 136 mmol/L (ref 135–145)

## 2019-01-23 LAB — MAGNESIUM: Magnesium: 1.9 mg/dL (ref 1.7–2.4)

## 2019-01-23 LAB — HEPARIN LEVEL (UNFRACTIONATED)
Heparin Unfractionated: 0.62 IU/mL (ref 0.30–0.70)
Heparin Unfractionated: 0.75 IU/mL — ABNORMAL HIGH (ref 0.30–0.70)

## 2019-01-23 MED ORDER — ELIQUIS 5 MG VTE STARTER PACK: ORAL_TABLET | ORAL | 0 refills | Status: DC

## 2019-01-23 MED ORDER — APIXABAN 5 MG PO TABS
10.0000 mg | ORAL_TABLET | Freq: Once | ORAL | Status: AC
  Administered 2019-01-23: 10 mg via ORAL
  Filled 2019-01-23: qty 2

## 2019-01-23 NOTE — Discharge Summary (Signed)
[facility] NAME: Nathan Scott    MR#:  [id number]  DATE OF BIRTH:  03/24/1961  DATE OF ADMISSION:  01/22/2019 ADMITTING PHYSICIAN: Dustin Flock, MD  DATE OF DISCHARGE: 01/23/2019  4:49 PM  PRIMARY CARE PHYSICIAN: Rusty Aus, MD   ADMISSION DIAGNOSIS:  Pulmonary embolism ([place]) [I26.99] Acute saddle pulmonary embolism with acute cor pulmonale ([place]) [I26.02]  DISCHARGE DIAGNOSIS:  Active Problems:   Pulmonary embolism ([place])   SECONDARY DIAGNOSIS:   Past Medical History:  Diagnosis Date  . 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 GERD (gastroesophageal reflux disease)   . Sleep apnea    no cpap x 8 years     ADMITTING HISTORY  HISTORY OF PRESENT ILLNESS: Nathan Scott  is a 58 y.o. male with a known history of  GERD and sleep apnea who presents with shortness of breath ongoing for the past few weeks.  Patient even had a cardiac cath that was negative.  Today he went to see the pulmonologist who did some blood work and asked him to follow back up.  He went home and was walking when he collapsed.  And brought to the emergency room.  He underwent a CT scan of the chest which showed saddle pulmonary embolus.  Patient was seen by vascular surgery underwent thrombolysis of the pulmonary arteries.  Patient currently denying any chest pain or shortness of breath.  HOSPITAL COURSE:   * Acute saddle embolus Admitted to tele monitored floor  S/p Thrombolysis and thrombectomy. LE US showed Right femoral non occlusive DVT. Continued heparin drip and transition to Eliquis at discharge. Discussed with vascular team. Needs f/u with Vascular surgery in 1 week. SOB has resolved. Ambulated without any issues. Has some bleeding from Angiogram site that has stopped with pressure dressing.  Stable for discharge to home  CONSULTS OBTAINED:  Treatment Team:  Hillary Bow, MD  DRUG ALLERGIES:  No Known Allergies  DISCHARGE MEDICATIONS:    Allergies as of 01/23/2019   No Known Allergies     Medication List    TAKE these medications   ALPRAZolam 0.5 MG tablet Commonly known as: XANAX Take 0.5 mg by mouth at bedtime as needed for sleep.   APPLE CIDER VINEGAR PO Take 3 capsules by mouth daily.   aspirin EC 81 MG tablet Take 81 mg by mouth daily.   cholecalciferol 25 MCG (1000 UT) tablet Commonly known as: VITAMIN D Take 1,000 Units by mouth daily.   cyclobenzaprine 10 MG tablet Commonly known as: FLEXERIL Take 10 mg by mouth daily as needed for muscle spasms.   Eliquis DVT/PE Starter Pack 5 MG Tabs Take as directed on package: start with two-5mg  tablets twice daily for 7 days. On day 8, switch to one-5mg  tablet twice daily.   Fish Oil 1000 MG Caps Take 2,000 mg by mouth daily.   Flaxseed Oil 1200 MG Caps Take 2,400 mg by mouth daily.   hydrochlorothiazide 25 MG tablet Commonly known as: HYDRODIURIL Take 25 mg by mouth daily.   Magnesium Oxide 500 MG Tabs Take 500 mg by mouth daily.   meloxicam 15 MG tablet Commonly known as: MOBIC Take 15 mg by mouth daily.   Nasacort Allergy 24HR 55 MCG/ACT Aero nasal inhaler Generic drug: triamcinolone Place 2 sprays into the nose daily.   omeprazole 40 MG capsule Commonly known as: PRILOSEC Take 40 mg by mouth daily before breakfast.   pyridOXINE 100 MG tablet Commonly  known as: VITAMIN B-6 Take 100 mg by mouth daily.   rOPINIRole 2 MG 24 hr tablet Commonly known as: REQUIP XL Take 2 mg by mouth at bedtime.   sertraline 50 MG tablet Commonly known as: ZOLOFT Take 50 mg by mouth at bedtime.   simvastatin 20 MG tablet Commonly known as: ZOCOR Take 20 mg by mouth at bedtime.   triamcinolone cream 0.1 % Commonly known as: KENALOG Apply 1 application topically 2 (two) times daily as needed (for itching legs.).       Today   VITAL SIGNS:  Blood pressure 104/67, pulse 99, temperature 98.2 F (36.8 C), temperature source Oral, resp. rate 16,  height 6\' 1"  (1.854 m), weight 99.8 kg, SpO2 94 %.  I/O:    Intake/Output Summary (Last 24 hours) at 01/23/2019 2053 Last data filed at 01/23/2019 1300 Gross per 24 hour  Intake 610.18 ml  Output -  Net 610.18 ml    PHYSICAL EXAMINATION:  Physical Exam  GENERAL:  58 y.o.-year-old patient lying in the bed with no acute distress.  LUNGS: Normal breath sounds bilaterally, no wheezing, rales,rhonchi or crepitation. No use of accessory muscles of respiration.  CARDIOVASCULAR: S1, S2 normal. No murmurs, rubs, or gallops.  ABDOMEN: Soft, non-tender, non-distended. Bowel sounds present. No organomegaly or mass.  NEUROLOGIC: Moves all 4 extremities. PSYCHIATRIC: The patient is alert and oriented x 3.  SKIN: No obvious rash, lesion, or ulcer.   DATA REVIEW:   CBC Recent Labs  Lab 01/23/19 0322  WBC 6.7  HGB 11.6*  HCT 34.0*  PLT 126*    Chemistries  Recent Labs  Lab 01/23/19 0322  NA 136  K 3.9  CL 106  CO2 25  GLUCOSE 114*  BUN 25*  CREATININE 0.94  CALCIUM 8.0*  MG 1.9    Cardiac Enzymes No results for input(s): TROPONINI in the last 168 hours.  Microbiology Results  Results for orders placed or performed during the hospital encounter of 01/22/19  SARS Coronavirus 2 by RT PCR (hospital order, performed in [facility] hospital lab) Nasopharyngeal Nasopharyngeal Swab     Status: None   Collection Time: 01/22/19  3:38 PM   Specimen: Nasopharyngeal Swab  Result Value Ref Range Status   SARS Coronavirus 2 NEGATIVE NEGATIVE Final    Comment: (NOTE) If result is NEGATIVE SARS-CoV-2 target nucleic acids are NOT DETECTED. The SARS-CoV-2 RNA is generally detectable in upper and lower  respiratory specimens during the acute phase of infection. The lowest  concentration of SARS-CoV-2 viral copies this assay can detect is 250  copies / mL. A negative result does not preclude SARS-CoV-2 infection  and should not be used as the sole basis for treatment or other  patient  management decisions.  A negative result may occur with  improper specimen collection / handling, submission of specimen other  than nasopharyngeal swab, presence of viral mutation(s) within the  areas targeted by this assay, and inadequate number of viral copies  (<250 copies / mL). A negative result must be combined with clinical  observations, patient history, and epidemiological information. If result is POSITIVE SARS-CoV-2 target nucleic acids are DETECTED. The SARS-CoV-2 RNA is generally detectable in upper and lower  respiratory specimens dur ing the acute phase of infection.  Positive  results are indicative of active infection with SARS-CoV-2.  Clinical  correlation with patient history and other diagnostic information is  necessary to determine patient infection status.  Positive results do  not rule out bacterial infection  or co-infection with other viruses. If result is PRESUMPTIVE POSTIVE SARS-CoV-2 nucleic acids MAY BE PRESENT.   A presumptive positive result was obtained on the submitted specimen  and confirmed on repeat testing.  While 2019 novel coronavirus  (SARS-CoV-2) nucleic acids may be present in the submitted sample  additional confirmatory testing may be necessary for epidemiological  and / or clinical management purposes  to differentiate between  SARS-CoV-2 and other Sarbecovirus currently known to infect humans.  If clinically indicated additional testing with an alternate test  methodology [phone]) is advised. The SARS-CoV-2 RNA is generally  detectable in upper and lower respiratory sp ecimens during the acute  phase of infection. The expected result is Negative. Fact Sheet for Patients:  StrictlyIdeas.no Fact Sheet for Healthcare Providers: BankingDealers.co.za This test is not yet approved or cleared by the [place] FDA and has been authorized for detection and/or diagnosis of SARS-CoV-2 by FDA under  an Emergency Use Authorization (EUA).  This EUA will remain in effect (meaning this test can be used) for the duration of the COVID-19 declaration under Section 564(b)(1) of the Act, 21 U.S.C. section 360bbb-3(b)(1), unless the authorization is terminated or revoked sooner. Performed at [facility], [place], [place] [address]     RADIOLOGY:  Ct Angio Chest Pe W And/or Wo Contrast  Result Date: 01/22/2019 CLINICAL DATA:  58 year old male with shortness of breath and syncope. EXAM: CT ANGIOGRAPHY CHEST WITH CONTRAST TECHNIQUE: Multidetector CT imaging of the chest was performed using the standard protocol during bolus administration of intravenous contrast. Multiplanar CT image reconstructions and MIPs were obtained to evaluate the vascular anatomy. CONTRAST:  53mL OMNIPAQUE IOHEXOL 350 MG/ML SOLN COMPARISON:  01/22/2019 chest radiograph FINDINGS: Cardiovascular: This is a technically satisfactory study. Acute pulmonary emboli are noted at the bifurcation of the main pulmonary artery and extending into both main pulmonary arteries and multiple bilateral pulmonary segments. RV/LV ratio measures 1.3 compatible with CT evidence of RIGHT heart strain. A small pericardial effusion is noted. No thoracic aortic aneurysm. Coronary artery atherosclerotic calcifications are present. Mediastinum/Nodes: No enlarged mediastinal, hilar, or axillary lymph nodes. Thyroid gland, trachea, and esophagus demonstrate no significant findings. Lungs/Pleura: Mild dependent and bibasilar atelectasis noted. No airspace disease, consolidation, mass, pleural effusion or pneumothorax. Upper Abdomen: Question mild hepatic steatosis. No acute abnormality. Musculoskeletal: No acute or suspicious bony abnormality. Review of the MIP images confirms the above findings. IMPRESSION: 1. Acute large clot burden bilateral pulmonary emboli with CT evidence of RIGHT heart strain. 2. Mild dependent/bibasilar atelectasis.  3. Coronary artery disease.  Small pericardial effusion. 4. Question hepatic steatosis. Critical Value/emergent results were called by telephone at the time of interpretation on 01/22/2019 at 5:29 pm to [facility] , who verbally acknowledged these results. Electronically Signed   By: Margarette Canada M.D.   On: 01/22/2019 17:30   US Venous Img Lower Bilateral  Result Date: 01/23/2019 CLINICAL DATA:  History of pulmonary embolism, post catheter directed thrombectomy. Evaluate for lower extremity DVT. EXAM: BILATERAL LOWER EXTREMITY VENOUS DOPPLER ULTRASOUND TECHNIQUE: Gray-scale sonography with graded compression, as well as color Doppler and duplex ultrasound were performed to evaluate the lower extremity deep venous systems from the level of the common femoral vein and including the common femoral, femoral, profunda femoral, popliteal and calf veins including the posterior tibial, peroneal and gastrocnemius veins when visible. The superficial great saphenous vein was also interrogated. Spectral Doppler was utilized to evaluate flow at rest and with distal augmentation maneuvers in the  common femoral, femoral and popliteal veins. COMPARISON:  Chest CTA-01/22/2019 FINDINGS: RIGHT LOWER EXTREMITY Common Femoral Vein: Not evaluated due to bandage from thrombectomy access site. Saphenofemoral Junction: Not evaluated due to bandaging from thrombectomy access site. Profunda Femoral Vein: No evidence of thrombus. Normal compressibility and flow on color Doppler imaging. Femoral Vein: While the proximal (image 6) and mid (image 8) aspects of the right femoral vein appear widely patent, there is mixed echogenic nonocclusive thrombus seen within distal aspect of the right femoral vein (representative image 12 and 13). Popliteal Vein: Mixed echogenic nonocclusive thrombus is seen throughout the right popliteal vein (images 16 through 22). Calf Veins: No evidence of thrombus. Normal compressibility and flow on  color Doppler imaging. Superficial Great Saphenous Vein: No evidence of thrombus. Normal compressibility. Other Findings:  None. LEFT LOWER EXTREMITY Common Femoral Vein: No evidence of thrombus. Normal compressibility, respiratory phasicity and response to augmentation. Saphenofemoral Junction: No evidence of thrombus. Normal compressibility and flow on color Doppler imaging. Profunda Femoral Vein: No evidence of thrombus. Normal compressibility and flow on color Doppler imaging. Femoral Vein: No evidence of thrombus. Normal compressibility, respiratory phasicity and response to augmentation. Popliteal Vein: No evidence of thrombus. Normal compressibility, respiratory phasicity and response to augmentation. Calf Veins: No evidence of thrombus. Normal compressibility and flow on color Doppler imaging. Superficial Great Saphenous Vein: No evidence of thrombus. Normal compressibility. Other Findings:  None. IMPRESSION: 1. Examination is positive for short segment nonocclusive DVT involving the distal aspect the right femoral vein as well as the right popliteal vein. 2. No evidence of DVT within the left lower extremity. Electronically Signed   By: Sandi Mariscal M.D.   On: 01/23/2019 11:58   Dg Chest Port 1 View  Result Date: 01/22/2019 CLINICAL DATA:  Dyspnea, diaphoresis, syncopal episode EXAM: PORTABLE CHEST 1 VIEW COMPARISON:  08/23/2005 FINDINGS: The heart size and mediastinal contours are within normal limits. Both lungs are clear. The visualized skeletal structures are unremarkable. IMPRESSION: No active disease. Electronically Signed   By: Kathreen Devoid   On: 01/22/2019 16:12    Follow up with PCP in 1 week.  Management plans discussed with the patient, family and they are in agreement.  CODE STATUS:  Code Status History    Date Active Date Inactive Code Status Order ID Comments User Context   01/22/2019 2138 01/23/2019 1954 Full Code [id number]  Katha Cabal, MD ED   01/12/2019 1518 01/12/2019  1941 Full Code [id number]  Teodoro Spray, MD Inpatient   Advance Care Planning Activity      TOTAL TIME TAKING CARE OF THIS PATIENT ON DAY OF DISCHARGE: more than 30 minutes.   Leia Alf Evany Schecter M.D on 01/23/2019 at 8:53 PM  Between 7am to 6pm - Pager - [phone]  After 6pm go to [email or web address] - password [email or web address] [facility]  Office  [phone]  CC: Primary care physician; Rusty Aus, MD  Note: This dictation was prepared with Dragon dictation along with smaller phrase technology. Any transcriptional errors that result from this process are unintentional.

## 2019-01-23 NOTE — Consult Note (Signed)
ANTICOAGULATION CONSULT NOTE  Pharmacy Consult for Heparin Infusion Dosing Indication: pulmonary embolus  No Known Allergies  Patient Measurements: Height: 6\' 1"  (185.4 cm) Weight: 220 lb (99.8 kg) IBW/kg (Calculated) : 79.9  Vital Signs: Temp: 97.7 F (36.5 C) (10/09 0635) Temp Source: Oral (10/09 0635) BP: 92/57 (10/09 0635) Pulse Rate: 95 (10/09 0635)  Labs: Recent Labs    01/22/19 1538 01/22/19 1706 01/23/19 0322  HGB 14.8  --  11.6*  HCT 43.3  --  34.0*  PLT 188  --  126*  APTT  --  26  --   LABPROT  --  14.1  --   INR  --  1.1  --   HEPARINUNFRC  --   --  0.62  CREATININE 0.94  --  0.94  TROPONINIHS 57*  --   --     Estimated Creatinine Clearance: 103.9 mL/min (by C-G formula based on SCr of 0.94 mg/dL).   Medical History: Past Medical History:  Diagnosis Date  . 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 GERD (gastroesophageal reflux disease)   . Sleep apnea    no cpap x 8 years    Assessment: Pharmacy consulted for heparin infusion dosing and monitoring for 58 yo male admitted with PE. CT Chest shows acute large clot burden bilateral pulmonary emboli with CT evidence of RIGHT heart strain.  No reported anticoagulants PTA. S/P Catheter placement and mechanical thrombectomy 10/8. Pharmacy consulted for heparin restart to beginning 8 hr post sheath removal. Sheath removal documented @ 19:41 but Dr Delana Meyer called @ 20:30 and wanted to resume heparin at previous rate right away.  H&H, PLT noted to be trending down  Heparin Course: 10/08 pm initiation: 5000 unit bolus then 1650 units/hr 10/09 0322 HL 0.62: continued at 1650 units/hr 10/09 0859 HL 0.75: decrease rate to 1550   Goal of Therapy:   Heparin level 0.3-0.7 units/ml Monitor platelets by anticoagulation protocol: Yes   Plan:   Heparin level slightly supratherapeutic: decrease rate to 1550 units/hr  Check F/U heparin level 6 hours after rate change  CBC in am  Dallie Piles, PharmD Clinical  Pharmacist 01/23/2019 7:27 AM

## 2019-01-23 NOTE — Progress Notes (Signed)
[place], [place].   01/23/2019  Patient: Nathan Scott   Date of Birth:  May 08, 1960  Date of admission:  01/22/2019  Date of Discharge  01/23/2019    To Whom it May Concern:   Sameh Peri  Was admitted on 01/22/2019 and discharged 01/23/2019. He can return to work 02/02/2019.  If you have any questions or concerns, please don't hesitate to call.  Sincerely,   Neita Carp M.D Office : [phone]   .

## 2019-01-23 NOTE — Progress Notes (Addendum)
[facility] Daily Progress Note   Subjective: 1 Day Post-Op:             1.  Contrast injection right heart             2.  Selective catheter placement right upper, middle and lower lobes             3.  Selective catheter placement left lower lobe             4.  Thrombolysis left and right lobar pulmonary arteries             5.   Mechanical thrombectomy with the penumbra CAT 12 right upper, middle and lower lobe pulmonary arteries and left lower lobe pulmonary artery  Patient without complaint this AM. SOB / dyspnea has improved.   Objective: Vitals:   01/22/19 2231 01/22/19 2245 01/22/19 2320 01/23/19 0635  BP: 122/80  104/75 (!) 92/57  Pulse:   90 95  Resp:   20 20  Temp:  (!) 97 F (36.1 C) 98.4 F (36.9 C) 97.7 F (36.5 C)  TempSrc:  Axillary Oral Oral  SpO2:   94% 94%  Weight:      Height:        Intake/Output Summary (Last 24 hours) at 01/23/2019 1101 Last data filed at 01/23/2019 1012 Gross per 24 hour  Intake 120 ml  Output -  Net 120 ml   Physical Exam: A&Ox3, NAD CV: RRR Pulmonary: CTA Bilaterally Abdomen: Soft, Nontender, Nondistended Right Groin: PAD removed. No active bleeding, swelling or drainage noted Vascular:  Right Lower Extremity: Thigh soft.  Calf soft.  Extremities warm distally to toes.  Dopplerable pedal pulses.  Left Lower Extremity:  Thigh soft.  Calf soft.  Extremities warm distally to toes.  Dopplerable pedal pulses.    Document Information Photos  s/p pulmonary lysis  01/23/2019 12:04  Attached To:  Hospital Encounter on 01/22/19  Source Information Leonides Schanz  [facility]   Laboratory: CBC    Component Value Date/Time   WBC 6.7 01/23/2019 0322   HGB 11.6 (L) 01/23/2019 0322   HGB 14.2 03/25/2013 1449   HCT 34.0 (L) 01/23/2019 0322   HCT 40.5 03/25/2013 1449   PLT 126 (L) 01/23/2019 0322   PLT 181 03/25/2013 1449   BMET    Component Value Date/Time   NA 136 01/23/2019  0322   NA 140 03/25/2013 1449   K 3.9 01/23/2019 0322   K 3.8 03/25/2013 1449   CL 106 01/23/2019 0322   CL 107 03/25/2013 1449   CO2 25 01/23/2019 0322   CO2 28 03/25/2013 1449   GLUCOSE 114 (H) 01/23/2019 0322   GLUCOSE 84 03/25/2013 1449   BUN 25 (H) 01/23/2019 0322   BUN 18 03/25/2013 1449   CREATININE 0.94 01/23/2019 0322   CREATININE 0.77 03/25/2013 1449   CALCIUM 8.0 (L) 01/23/2019 0322   CALCIUM 9.0 03/25/2013 1449   GFRNONAA >60 01/23/2019 0322   GFRNONAA >60 03/25/2013 1449   GFRAA >60 01/23/2019 0322   GFRAA >60 03/25/2013 1449   Assessment/Planning: The patient is a 58 year old male who presented to the [facility] emergency department impressively worsening shortness of breath found to have PE status post pulmonary lysis POD#1 1) Marked improvement in respiratory status this AM.   2) Follow up venous duplex to rule out DVT 3) Transition to Eliquis 5mg  PO BID when medically appropriate 4) OOB, PT, Ambulation today  Discussed with Dr. Eber Hong  PA-C 01/23/2019 11:01 AM

## 2019-01-23 NOTE — Discharge Instructions (Signed)
Vascular Surgery Discharge Instructions 1) You may shower as of tomorrow. Keep right groin access site clean and dry. 2) No lifting greater then 10 pounds or strenuous activity over the next two weeks.

## 2019-01-23 NOTE — Consult Note (Signed)
ANTICOAGULATION CONSULT NOTE - Initial Consult  Pharmacy Consult for Heparin Infusion Dosing Indication: pulmonary embolus  No Known Allergies  Patient Measurements: Height: 6\' 1"  (185.4 cm) Weight: 220 lb (99.8 kg) IBW/kg (Calculated) : 79.9  Vital Signs: Temp: 98.4 F (36.9 C) (10/08 2320) Temp Source: Oral (10/08 2320) BP: 104/75 (10/08 2320) Pulse Rate: 90 (10/08 2320)  Labs: Recent Labs    01/22/19 1538 01/22/19 1706 01/23/19 0322  HGB 14.8  --  11.6*  HCT 43.3  --  34.0*  PLT 188  --  126*  APTT  --  26  --   LABPROT  --  14.1  --   INR  --  1.1  --   HEPARINUNFRC  --   --  0.62  CREATININE 0.94  --  0.94  TROPONINIHS 57*  --   --     Estimated Creatinine Clearance: 103.9 mL/min (by C-G formula based on SCr of 0.94 mg/dL).   Medical History: Past Medical History:  Diagnosis Date  . 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 GERD (gastroesophageal reflux disease)   . Sleep apnea    no cpap x 8 years    Assessment: Pharmacy consulted for heparin infusion dosing and monitoring for 58 yo male admitted with PE. CT Chest shows acute large clot burden bilateral pulmonary emboli with CT evidence of RIGHT heart strain.  No reported anticoagulants PTA.   Hgb: 14.8    Hct: 43.3   Plts: 188    Troponin I (HS): 57   10/8 2000 Patient S/P Catheter placement and mechanical thrombectomy. Pharmacy consulted for heparin restart to begin 8 hr post sheath removal. Sheath removal documented @ 19:41  10/8:  Dr Delana Meyer called @ 20:30 and wanted to resume heparin at previous rate right away.    Goal of Therapy:   Heparin level 0.3-0.7 units/ml Monitor platelets by anticoagulation protocol: Yes   Plan:  1009 @ 0322 HL 0.62 therapeutic. Will continue current rate and will recheck HL @ 0900, CBC trended down will continue to monitor.  Tobie Lords, PharmD Clinical Pharmacist 01/23/2019 5:28 AM

## 2019-01-23 NOTE — Progress Notes (Signed)
Advance care planning  Purpose of Encounter Pulmonary embolism  Parties in Attendance Patient and Wife  Patients Decisional capacity Alert and awake. ABle to make medical decisions  Discussed in detail regarding Pulmonary embolism.  Treatment plan , prognosis discussed.  All questions answered  Patient is s/p thrombectomy and thrombolysis for PE. Discussed in detail regarding improtance of follow up and explained complications to watch in detail.  Code status - FULL CODE   Time spent - 17 minutes

## 2019-01-23 NOTE — Progress Notes (Signed)
PT Cancellation Note  Patient Details Name: Yeraldo Dorff MRN: [id number] DOB: January 14, 1961   Cancelled Treatment:    Reason Eval/Treat Not Completed: PT screened, no needs identified, will sign off(Consult received and chart reviewed.  Per primary RN, patient has been ambulatory in room/around unit without difficulty, maintaining sats appropriately.  Steady without buckling, LOB or safety concern. No acute PT needs identified.  Will complete order at this time.  Please re-consult should need change if patient remains hospitalized (scheduled for discharge this date).)  Shetara Launer H. Owens Shark, PT, DPT, NCS 01/23/19, 2:50 PM [phone]

## 2019-01-24 LAB — ANTITHROMBIN III: AntiThromb III Func: 79 % (ref 75–120)

## 2019-01-24 LAB — HOMOCYSTEINE: Homocysteine: <3 umol/L (ref 0.0–14.5)

## 2019-01-24 LAB — PROTEIN C, TOTAL: Protein C, Total: 84 % (ref 60–150)

## 2019-01-26 ENCOUNTER — Encounter: Payer: Self-pay | Admitting: Vascular Surgery

## 2019-01-26 LAB — BETA-2-GLYCOPROTEIN I ABS, IGG/M/A
Beta-2 Glyco I IgG: <9 GPI IgG units (ref 0–20)
Beta-2-Glycoprotein I IgA: <9 GPI IgA units (ref 0–25)
Beta-2-Glycoprotein I IgM: <9 GPI IgM units (ref 0–32)

## 2019-01-26 LAB — LUPUS ANTICOAGULANT PANEL
DRVVT: 41.2 s (ref 0.0–47.0)
PTT Lupus Anticoagulant: 34.6 s (ref 0.0–51.9)

## 2019-01-26 LAB — PROTEIN S, TOTAL: Protein S Ag, Total: 88 % (ref 60–150)

## 2019-01-26 LAB — CARDIOLIPIN ANTIBODIES, IGG, IGM, IGA
Anticardiolipin IgA: <9 APL U/mL (ref 0–11)
Anticardiolipin IgG: <9 GPL U/mL (ref 0–14)
Anticardiolipin IgM: <9 MPL U/mL (ref 0–12)

## 2019-01-26 LAB — PROTEIN S ACTIVITY: Protein S Activity: 83 % (ref 63–140)

## 2019-01-26 LAB — PROTEIN C ACTIVITY: Protein C Activity: 104 % (ref 73–180)

## 2019-01-28 ENCOUNTER — Other Ambulatory Visit (INDEPENDENT_AMBULATORY_CARE_PROVIDER_SITE_OTHER): Payer: Self-pay | Admitting: Vascular Surgery

## 2019-01-28 DIAGNOSIS — I82411 Acute embolism and thrombosis of right femoral vein: Secondary | ICD-10-CM

## 2019-01-28 DIAGNOSIS — Z86711 Personal history of pulmonary embolism: Secondary | ICD-10-CM

## 2019-01-28 LAB — FACTOR 5 LEIDEN

## 2019-01-29 LAB — PROTHROMBIN GENE MUTATION

## 2019-01-30 ENCOUNTER — Other Ambulatory Visit: Payer: Self-pay | Admitting: Internal Medicine

## 2019-01-30 ENCOUNTER — Other Ambulatory Visit (INDEPENDENT_AMBULATORY_CARE_PROVIDER_SITE_OTHER): Payer: Self-pay | Admitting: Nurse Practitioner

## 2019-01-30 DIAGNOSIS — D689 Coagulation defect, unspecified: Secondary | ICD-10-CM

## 2019-01-30 DIAGNOSIS — I2699 Other pulmonary embolism without acute cor pulmonale: Secondary | ICD-10-CM

## 2019-02-02 ENCOUNTER — Other Ambulatory Visit: Payer: Self-pay

## 2019-02-02 ENCOUNTER — Ambulatory Visit (INDEPENDENT_AMBULATORY_CARE_PROVIDER_SITE_OTHER): Payer: Commercial Managed Care - PPO

## 2019-02-02 ENCOUNTER — Encounter (INDEPENDENT_AMBULATORY_CARE_PROVIDER_SITE_OTHER): Payer: Self-pay | Admitting: Vascular Surgery

## 2019-02-02 ENCOUNTER — Ambulatory Visit
Admission: RE | Admit: 2019-02-02 | Discharge: 2019-02-02 | Disposition: A | Discharge status: Home / Self Care | Payer: Commercial Managed Care - PPO | Source: Ambulatory Visit | Attending: Internal Medicine | Admitting: Internal Medicine

## 2019-02-02 ENCOUNTER — Other Ambulatory Visit: Payer: Self-pay | Admitting: Oncology

## 2019-02-02 ENCOUNTER — Ambulatory Visit: Payer: Commercial Managed Care - PPO

## 2019-02-02 ENCOUNTER — Ambulatory Visit (INDEPENDENT_AMBULATORY_CARE_PROVIDER_SITE_OTHER): Payer: Commercial Managed Care - PPO | Admitting: Vascular Surgery

## 2019-02-02 VITALS — BP 111/73 | HR 84 | Resp 12 | Ht 73.0 in | Wt 222.0 lb

## 2019-02-02 DIAGNOSIS — D689 Coagulation defect, unspecified: Secondary | ICD-10-CM | POA: Diagnosis present

## 2019-02-02 DIAGNOSIS — I2699 Other pulmonary embolism without acute cor pulmonale: Secondary | ICD-10-CM | POA: Diagnosis present

## 2019-02-02 DIAGNOSIS — D6859 Other primary thrombophilia: Secondary | ICD-10-CM | POA: Diagnosis not present

## 2019-02-02 DIAGNOSIS — K219 Gastro-esophageal reflux disease without esophagitis: Secondary | ICD-10-CM | POA: Insufficient documentation

## 2019-02-02 DIAGNOSIS — N281 Cyst of kidney, acquired: Secondary | ICD-10-CM

## 2019-02-02 DIAGNOSIS — I2602 Saddle embolus of pulmonary artery with acute cor pulmonale: Secondary | ICD-10-CM

## 2019-02-02 DIAGNOSIS — Z9189 Other specified personal risk factors, not elsewhere classified: Secondary | ICD-10-CM | POA: Diagnosis not present

## 2019-02-02 DIAGNOSIS — Z86711 Personal history of pulmonary embolism: Secondary | ICD-10-CM | POA: Diagnosis not present

## 2019-02-02 DIAGNOSIS — I82411 Acute embolism and thrombosis of right femoral vein: Secondary | ICD-10-CM

## 2019-02-02 MED ORDER — IOHEXOL 300 MG/ML  SOLN
100.0000 mL | Freq: Once | INTRAMUSCULAR | Status: AC | PRN
  Administered 2019-02-02: 100 mL via INTRAVENOUS

## 2019-02-02 MED ORDER — APIXABAN 5 MG PO TABS: 5.0000 mg | ORAL_TABLET | Freq: Two times a day (BID) | ORAL | 11 refills | Status: DC

## 2019-02-02 NOTE — Progress Notes (Signed)
MRN : [id number]  Nathan Scott is a 58 y.o. (03-09-1961) male who presents with chief complaint of No chief complaint on file. Marland Kitchen  History of Present Illness:   The patient presents to the office for follow-up evaluation status post pulmonary thrombectomy.  Thrombectomy was performed at at [facility] on 01/22/2019.  Mechanical thrombectomy with the penumbra CAT 12 right upper, middle and lower lobe pulmonary arteries and left lower lobe pulmonary artery.   The presenting symptoms were pleuritic chest pain and profound SOB.  CT angiogram demonstrated a large volume of emboli with significant right heart strain.  The patient notes resolution of her shortness of breath.  She denies pleuritic chest pain.  She denies persisting cough or hemoptysis.  The patient has not been using compression therapy at this point.  The patient denies significant leg pain and swelling dependency.  The patient notes minimal edema in the morning.    No blood per rectum or blood in any sputum.  No excessive bruising per the patient.   CT scan is reviewed by me and shows multiple renal cysts on the right it measures about 9 cm several smaller cysts on the left  No outpatient medications have been marked as taking for the 02/02/19 encounter (Appointment) with Delana Meyer, Dolores Lory, MD.    Past Medical History:  Diagnosis Date  . 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 GERD (gastroesophageal reflux disease)   . Sleep apnea    no cpap x 8 years    Past Surgical History:  Procedure Laterality Date  . FRACTURE SURGERY Left    pinning leg fx  . LEFT HEART CATH AND CORONARY ANGIOGRAPHY Left 01/12/2019   Procedure: LEFT HEART CATH AND CORONARY ANGIOGRAPHY;  Surgeon: Teodoro Spray, MD;  Location: [place] [facility];  Service: Cardiovascular;  Laterality: Left;  . NASAL SEPTOPLASTY W/ TURBINOPLASTY Bilateral 02/20/2016   Procedure: NASAL SEPTOPLASTY WITH TURBINATE REDUCTION;  Surgeon: Margaretha Sheffield, MD;  Location: [facility];   Service: ENT;  Laterality: Bilateral;  . PULMONARY THROMBECTOMY Bilateral 01/22/2019   Procedure: PULMONARY THROMBECTOMY;  Surgeon: Katha Cabal, MD;  Location: [place] [facility];  Service: Cardiovascular;  Laterality: Bilateral;    Social History Social History   Tobacco Use  . Smoking status: Never Smoker  . Smokeless tobacco: Never Used  Substance Use Topics  . Alcohol use: No  . Drug use: No    Family History No family history on file.  No Known Allergies   REVIEW OF SYSTEMS (Negative unless checked)  Constitutional: [] Weight loss  [] Fever  [] Chills Cardiac: [] Chest pain   [] Chest pressure   [] Palpitations   [] Shortness of breath when laying flat   [] Shortness of breath with exertion. Vascular:  [] Pain in legs with walking   [] Pain in legs at rest  [x] History of DVT   [] Phlebitis   [x] Swelling in legs   [] Varicose veins   [] Non-healing ulcers Pulmonary:   [] Uses home oxygen   [] Productive cough   [] Hemoptysis   [] Wheeze  [] COPD   [] Asthma Neurologic:  [] Dizziness   [] Seizures   [] History of stroke   [] History of TIA  [] Aphasia   [] Vissual changes   [] Weakness or numbness in arm   [] Weakness or numbness in leg Musculoskeletal:   [] Joint swelling   [] Joint pain   [] Low back pain Hematologic:  [] Easy bruising  [] Easy bleeding   [] Hypercoagulable state   [] Anemic Gastrointestinal:  [] Diarrhea   [] Vomiting  [] Gastroesophageal reflux/heartburn   []   Difficulty swallowing. Genitourinary:  [] Chronic kidney disease   [] Difficult urination  [] Frequent urination   [] Blood in urine Skin:  [] Rashes   [] Ulcers  Psychological:  [] History of anxiety   []  History of major depression.  Physical Examination  There were no vitals filed for this visit. There is no height or weight on file to calculate BMI. Gen: WD/WN, NAD Head: [place]/AT, No temporalis wasting.  Ear/Nose/Throat: Hearing grossly intact, nares w/o erythema or drainage Eyes: PER, EOMI, sclera nonicteric.  Neck: Supple, no  large masses.   Pulmonary:  Good air movement, no audible wheezing bilaterally, no use of accessory muscles.  Cardiac: RRR, no JVD Vascular: Vessel Right Left  Radial Palpable Palpable  Gastrointestinal: Non-distended. No guarding/no peritoneal signs.  Musculoskeletal: M/S 5/5 throughout.  No deformity or atrophy.  Neurologic: CN 2-12 intact. Symmetrical.  Speech is fluent. Motor exam as listed above. Psychiatric: Judgment intact, Mood & affect appropriate for pt's clinical situation. Dermatologic: No rashes or ulcers noted.  No changes consistent with cellulitis. Lymph : No lichenification or skin changes of chronic lymphedema.  CBC Lab Results  Component Value Date   WBC 6.7 01/23/2019   HGB 11.6 (L) 01/23/2019   HCT 34.0 (L) 01/23/2019   MCV 87.2 01/23/2019   PLT 126 (L) 01/23/2019    BMET    Component Value Date/Time   NA 136 01/23/2019 0322   NA 140 03/25/2013 1449   K 3.9 01/23/2019 0322   K 3.8 03/25/2013 1449   CL 106 01/23/2019 0322   CL 107 03/25/2013 1449   CO2 25 01/23/2019 0322   CO2 28 03/25/2013 1449   GLUCOSE 114 (H) 01/23/2019 0322   GLUCOSE 84 03/25/2013 1449   BUN 25 (H) 01/23/2019 0322   BUN 18 03/25/2013 1449   CREATININE 0.94 01/23/2019 0322   CREATININE 0.77 03/25/2013 1449   CALCIUM 8.0 (L) 01/23/2019 0322   CALCIUM 9.0 03/25/2013 1449   GFRNONAA >60 01/23/2019 0322   GFRNONAA >60 03/25/2013 1449   GFRAA >60 01/23/2019 0322   GFRAA >60 03/25/2013 1449   Estimated Creatinine Clearance: 103.9 mL/min (by C-G formula based on SCr of 0.94 mg/dL).  COAG Lab Results  Component Value Date   INR 1.1 01/22/2019    Radiology Ct Angio Chest Pe W And/or Wo Contrast  Result Date: 01/22/2019 CLINICAL DATA:  58 year old male with shortness of breath and syncope. EXAM: CT ANGIOGRAPHY CHEST WITH CONTRAST TECHNIQUE: Multidetector CT imaging of the chest was performed using the standard protocol during bolus administration of intravenous contrast.  Multiplanar CT image reconstructions and MIPs were obtained to evaluate the vascular anatomy. CONTRAST:  49mL OMNIPAQUE IOHEXOL 350 MG/ML SOLN COMPARISON:  01/22/2019 chest radiograph FINDINGS: Cardiovascular: This is a technically satisfactory study. Acute pulmonary emboli are noted at the bifurcation of the main pulmonary artery and extending into both main pulmonary arteries and multiple bilateral pulmonary segments. RV/LV ratio measures 1.3 compatible with CT evidence of RIGHT heart strain. A small pericardial effusion is noted. No thoracic aortic aneurysm. Coronary artery atherosclerotic calcifications are present. Mediastinum/Nodes: No enlarged mediastinal, hilar, or axillary lymph nodes. Thyroid gland, trachea, and esophagus demonstrate no significant findings. Lungs/Pleura: Mild dependent and bibasilar atelectasis noted. No airspace disease, consolidation, mass, pleural effusion or pneumothorax. Upper Abdomen: Question mild hepatic steatosis. No acute abnormality. Musculoskeletal: No acute or suspicious bony abnormality. Review of the MIP images confirms the above findings. IMPRESSION: 1. Acute large clot burden bilateral pulmonary emboli with CT evidence of RIGHT heart strain. 2. Mild  dependent/bibasilar atelectasis. 3. Coronary artery disease.  Small pericardial effusion. 4. Question hepatic steatosis. Critical Value/emergent results were called by telephone at the time of interpretation on 01/22/2019 at 5:29 pm to [facility] , who verbally acknowledged these results. Electronically Signed   By: Margarette Canada M.D.   On: 01/22/2019 17:30   US Venous Img Lower Bilateral  Result Date: 01/23/2019 CLINICAL DATA:  History of pulmonary embolism, post catheter directed thrombectomy. Evaluate for lower extremity DVT. EXAM: BILATERAL LOWER EXTREMITY VENOUS DOPPLER ULTRASOUND TECHNIQUE: Gray-scale sonography with graded compression, as well as color Doppler and duplex ultrasound were performed to  evaluate the lower extremity deep venous systems from the level of the common femoral vein and including the common femoral, femoral, profunda femoral, popliteal and calf veins including the posterior tibial, peroneal and gastrocnemius veins when visible. The superficial great saphenous vein was also interrogated. Spectral Doppler was utilized to evaluate flow at rest and with distal augmentation maneuvers in the common femoral, femoral and popliteal veins. COMPARISON:  Chest CTA-01/22/2019 FINDINGS: RIGHT LOWER EXTREMITY Common Femoral Vein: Not evaluated due to bandage from thrombectomy access site. Saphenofemoral Junction: Not evaluated due to bandaging from thrombectomy access site. Profunda Femoral Vein: No evidence of thrombus. Normal compressibility and flow on color Doppler imaging. Femoral Vein: While the proximal (image 6) and mid (image 8) aspects of the right femoral vein appear widely patent, there is mixed echogenic nonocclusive thrombus seen within distal aspect of the right femoral vein (representative image 12 and 13). Popliteal Vein: Mixed echogenic nonocclusive thrombus is seen throughout the right popliteal vein (images 16 through 22). Calf Veins: No evidence of thrombus. Normal compressibility and flow on color Doppler imaging. Superficial Great Saphenous Vein: No evidence of thrombus. Normal compressibility. Other Findings:  None. LEFT LOWER EXTREMITY Common Femoral Vein: No evidence of thrombus. Normal compressibility, respiratory phasicity and response to augmentation. Saphenofemoral Junction: No evidence of thrombus. Normal compressibility and flow on color Doppler imaging. Profunda Femoral Vein: No evidence of thrombus. Normal compressibility and flow on color Doppler imaging. Femoral Vein: No evidence of thrombus. Normal compressibility, respiratory phasicity and response to augmentation. Popliteal Vein: No evidence of thrombus. Normal compressibility, respiratory phasicity and response to  augmentation. Calf Veins: No evidence of thrombus. Normal compressibility and flow on color Doppler imaging. Superficial Great Saphenous Vein: No evidence of thrombus. Normal compressibility. Other Findings:  None. IMPRESSION: 1. Examination is positive for short segment nonocclusive DVT involving the distal aspect the right femoral vein as well as the right popliteal vein. 2. No evidence of DVT within the left lower extremity. Electronically Signed   By: Sandi Mariscal M.D.   On: 01/23/2019 11:58   Dg Chest Port 1 View  Result Date: 01/22/2019 CLINICAL DATA:  Dyspnea, diaphoresis, syncopal episode EXAM: PORTABLE CHEST 1 VIEW COMPARISON:  08/23/2005 FINDINGS: The heart size and mediastinal contours are within normal limits. Both lungs are clear. The visualized skeletal structures are unremarkable. IMPRESSION: No active disease. Electronically Signed   By: Kathreen Devoid   On: 01/22/2019 16:12     Assessment/Plan 1. Acute saddle pulmonary embolism with acute cor pulmonale (HCC) Much improved now walking a mile without difficulty.  No surgery or intervention at this point in time.  IVC filter is not indicated at present.  Patient's duplex ultrasound of the venous system shows DVT from the popliteal to the femoral veins.  The patient is initiated on anticoagulation   Elevation was stressed, use of a recliner was discussed.  I have  had a long discussion with the patient regarding DVT and post phlebitic changes such as swelling and why it  causes symptoms such as pain.  The patient will wear graduated compression stockings class 1 (20-30 mmHg), beginning after three full days of anticoagulation, on a daily basis a prescription was given. The patient will  beginning wearing the stockings first thing in the morning and removing them in the evening. The patient is instructed specifically not to sleep in the stockings.  In addition, behavioral modification including elevation during the day and avoidance of  prolonged dependency will be initiated.    The patient will continue anticoagulation for now as there have not been any problems or complications at this point.    2. Primary hypercoagulable state ([place]) I will ask hematology to evaluate both the probable hypercoagulable state and possible renal malignancy  - Ambulatory referral to Hematology / Oncology  3. Renal cyst patient with a history of hematuria and large renal cyst  - Ambulatory referral to Urology  4. At risk for secondary malignancy Will send for colonoscopy  - Ambulatory referral to Gastroenterology   Hortencia Pilar, MD  02/02/2019 11:08 AM

## 2019-02-03 ENCOUNTER — Other Ambulatory Visit: Payer: Self-pay | Admitting: Internal Medicine

## 2019-02-03 DIAGNOSIS — N2889 Other specified disorders of kidney and ureter: Secondary | ICD-10-CM

## 2019-02-04 ENCOUNTER — Encounter (INDEPENDENT_AMBULATORY_CARE_PROVIDER_SITE_OTHER): Payer: Self-pay | Admitting: Vascular Surgery

## 2019-02-04 ENCOUNTER — Ambulatory Visit
Admission: RE | Admit: 2019-02-04 | Discharge: 2019-02-04 | Disposition: A | Discharge status: Home / Self Care | Payer: Commercial Managed Care - PPO | Source: Ambulatory Visit | Attending: Internal Medicine | Admitting: Internal Medicine

## 2019-02-04 ENCOUNTER — Telehealth: Payer: Self-pay

## 2019-02-04 ENCOUNTER — Other Ambulatory Visit: Payer: Self-pay

## 2019-02-04 DIAGNOSIS — Z1211 Encounter for screening for malignant neoplasm of colon: Secondary | ICD-10-CM

## 2019-02-04 DIAGNOSIS — Z9189 Other specified personal risk factors, not elsewhere classified: Secondary | ICD-10-CM

## 2019-02-04 DIAGNOSIS — N2889 Other specified disorders of kidney and ureter: Secondary | ICD-10-CM | POA: Diagnosis not present

## 2019-02-04 MED ORDER — GADOBUTROL 1 MMOL/ML IV SOLN
10.0000 mL | Freq: Once | INTRAVENOUS | Status: AC | PRN
  Administered 2019-02-04: 15:00:00 10 mL via INTRAVENOUS

## 2019-02-04 NOTE — Telephone Encounter (Signed)
Gastroenterology Pre-Procedure Review  Request Date: Tuesday 02/24/19 Requesting Physician: Dr. Allen Norris  PATIENT REVIEW QUESTIONS: The patient responded to the following health history questions as indicated:    1. Are you having any GI issues? no 2. Do you have a personal history of Polyps? no 3. Do you have a family history of Colon Cancer or Polyps? no 4. Diabetes Mellitus? no 5. Joint replacements in the past 12 months?no 6. Major health problems in the past 3 months?yes (Renal Mass) 7. Any artificial heart valves, MVP, or defibrillator?no    MEDICATIONS & ALLERGIES:    Patient reports the following regarding taking any anticoagulation/antiplatelet therapy:   Plavix, Coumadin, Eliquis, Xarelto, Lovenox, Pradaxa, Brilinta, or Effient? yes (Eliquis Blood Thinner Sent to Dr. Delana Meyer) Aspirin? yes (81 mg daily)  Patient confirms/reports the following medications:  Current Outpatient Medications  Medication Sig Dispense Refill  . ALPRAZolam (XANAX) 0.5 MG tablet Take 0.5 mg by mouth at bedtime as needed for sleep.     Marland Kitchen apixaban (ELIQUIS) 5 MG TABS tablet Take 1 tablet (5 mg total) by mouth 2 (two) times daily. 60 tablet 11  . APPLE CIDER VINEGAR PO Take 3 capsules by mouth daily.     Marland Kitchen aspirin EC 81 MG tablet Take 81 mg by mouth daily.    . cholecalciferol (VITAMIN D) 25 MCG (1000 UT) tablet Take 1,000 Units by mouth daily.    . cyclobenzaprine (FLEXERIL) 10 MG tablet Take 10 mg by mouth daily as needed for muscle spasms.     . Eliquis DVT/PE Starter Pack (ELIQUIS STARTER PACK) 5 MG TABS Take as directed on package: start with two-5mg  tablets twice daily for 7 days. On day 8, switch to one-5mg  tablet twice daily. 1 each 0  . Flaxseed, Linseed, (FLAXSEED OIL) 1200 MG CAPS Take 2,400 mg by mouth daily.    . hydrochlorothiazide (HYDRODIURIL) 25 MG tablet Take 25 mg by mouth daily.    . Magnesium Oxide 500 MG TABS Take 500 mg by mouth daily.    . meloxicam (MOBIC) 15 MG tablet Take 15 mg by  mouth daily.    . Omega-3 Fatty Acids (FISH OIL) 1000 MG CAPS Take 2,000 mg by mouth daily.    Marland Kitchen omeprazole (PRILOSEC) 40 MG capsule Take 40 mg by mouth daily before breakfast.    . pyridOXINE (VITAMIN B-6) 100 MG tablet Take 100 mg by mouth daily.    Marland Kitchen rOPINIRole (REQUIP XL) 2 MG 24 hr tablet Take 2 mg by mouth at bedtime.    . sertraline (ZOLOFT) 50 MG tablet Take 50 mg by mouth at bedtime.    . simvastatin (ZOCOR) 20 MG tablet Take 20 mg by mouth at bedtime.    . triamcinolone (NASACORT ALLERGY 24HR) 55 MCG/ACT AERO nasal inhaler Place 2 sprays into the nose daily.    Marland Kitchen triamcinolone cream (KENALOG) 0.1 % Apply 1 application topically 2 (two) times daily as needed (for itching legs.).     No current facility-administered medications for this visit.     Patient confirms/reports the following allergies:  No Known Allergies  No orders of the defined types were placed in this encounter.   AUTHORIZATION INFORMATION Primary Insurance: 1D#: Group #:  Secondary Insurance: 1D#: Group #:  SCHEDULE INFORMATION: Date: 02/24/19 Time: Location:[facility]

## 2019-02-05 ENCOUNTER — Other Ambulatory Visit: Payer: Commercial Managed Care - PPO

## 2019-02-05 ENCOUNTER — Other Ambulatory Visit: Payer: Self-pay

## 2019-02-05 NOTE — Progress Notes (Signed)
[name]  Telephone:([phone]) [phone] Fax:(336) [phone]  ID: Nathan Scott OB: 1960/05/14  MR#: [id number]  [id number]  Patient Care Team: Rusty Aus, MD as PCP - General (Internal Medicine)  CHIEF COMPLAINT: Pulmonary embolism, DVT  INTERVAL HISTORY: Patient is a 58 year old male who recently underwent thrombectomy for large saddle pulmonary embolism.  He continues to to have shortness of breath, but this is significantly improved.  He otherwise feels well.  He has no neurologic complaints.  He denies any recent fevers or illnesses.  He has a good appetite and denies weight loss.  He has no chest pain, cough, or hemoptysis.  He denies any nausea, vomiting, constipation, or diarrhea.  He had one episode of hematuria associated with pain in spring 2020, but no urinary complaints since.  Patient offers no further specific complaints today.  REVIEW OF SYSTEMS:   Review of Systems  Constitutional: Negative.  Negative for fever, malaise/fatigue and weight loss.  Respiratory: Positive for shortness of breath. Negative for cough and hemoptysis.   Cardiovascular: Negative.  Negative for chest pain and leg swelling.  Gastrointestinal: Negative.  Negative for abdominal pain, blood in stool and melena.  Genitourinary: Negative.  Negative for dysuria.  Musculoskeletal: Negative.  Negative for back pain.  Skin: Negative.  Negative for rash.  Neurological: Negative.  Negative for dizziness, focal weakness, weakness and headaches.  Psychiatric/Behavioral: Negative.  The patient is not nervous/anxious.     As per HPI. Otherwise, a complete review of systems is negative.  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   GERD (gastroesophageal reflux disease)    Pulmonary embolus (HCC)    Sleep apnea    no cpap x 8 years    PAST SURGICAL HISTORY: Past Surgical History:  Procedure Laterality Date   FRACTURE SURGERY Left     pinning leg fx   LEFT HEART CATH AND CORONARY ANGIOGRAPHY Left 01/12/2019   Procedure: LEFT HEART CATH AND CORONARY ANGIOGRAPHY;  Surgeon: Teodoro Spray, MD;  Location: [facility];  Service: Cardiovascular;  Laterality: Left;   NASAL SEPTOPLASTY W/ TURBINOPLASTY Bilateral 02/20/2016   Procedure: NASAL SEPTOPLASTY WITH TURBINATE REDUCTION;  Surgeon: Margaretha Sheffield, MD;  Location: [facility];  Service: ENT;  Laterality: Bilateral;   PULMONARY THROMBECTOMY Bilateral 01/22/2019   Procedure: PULMONARY THROMBECTOMY;  Surgeon: Katha Cabal, MD;  Location: [place];  Service: Cardiovascular;  Laterality: Bilateral;    FAMILY HISTORY: Family History  Problem Relation Age of Onset   Heart attack Father    Clotting disorder Paternal Grandmother     ADVANCED DIRECTIVES (Y/N):  N  HEALTH MAINTENANCE: Social History   Tobacco Use   Smoking status: Never Smoker   Smokeless tobacco: Never Used  Substance Use Topics   Alcohol use: No   Drug use: No     Colonoscopy:  PAP:  Bone density:  Lipid panel:  No Known Allergies  Current Outpatient Medications  Medication Sig Dispense Refill   ALPRAZolam (XANAX) 0.5 MG tablet Take 0.5 mg by mouth at bedtime as needed for sleep.      APPLE CIDER VINEGAR PO Take 3 capsules by mouth daily.      aspirin EC 81 MG tablet Take 81 mg by mouth daily.     cholecalciferol (VITAMIN D) 25 MCG (1000 UT) tablet Take 1,000 Units by mouth daily.     cyclobenzaprine (FLEXERIL) 10 MG tablet Take 10 mg by  mouth daily as needed for muscle spasms.      Eliquis DVT/PE Starter Pack (ELIQUIS STARTER PACK) 5 MG TABS Take as directed on package: start with two-5mg  tablets twice daily for 7 days. On day 8, switch to one-5mg  tablet twice daily. 1 each 0   Flaxseed, Linseed, (FLAXSEED OIL) 1200 MG CAPS Take 2,400 mg by mouth daily.     hydrochlorothiazide (HYDRODIURIL) 25 MG tablet Take 25 mg by mouth daily.     Magnesium Oxide 500  MG TABS Take 500 mg by mouth daily.     meloxicam (MOBIC) 15 MG tablet Take 15 mg by mouth daily. Has been daily, but pharmacist recommends prn while on eliquis.     Omega-3 Fatty Acids (FISH OIL) 1000 MG CAPS Take 2,000 mg by mouth daily.     omeprazole (PRILOSEC) 40 MG capsule Take 40 mg by mouth daily before breakfast.     pyridOXINE (VITAMIN B-6) 100 MG tablet Take 100 mg by mouth daily.     rOPINIRole (REQUIP XL) 2 MG 24 hr tablet Take 2 mg by mouth at bedtime.     sertraline (ZOLOFT) 50 MG tablet Take 50 mg by mouth at bedtime.     simvastatin (ZOCOR) 20 MG tablet Take 20 mg by mouth at bedtime.     triamcinolone (NASACORT ALLERGY 24HR) 55 MCG/ACT AERO nasal inhaler Place 2 sprays into the nose daily.     triamcinolone cream (KENALOG) 0.1 % Apply 1 application topically 2 (two) times daily as needed (for itching legs.).     apixaban (ELIQUIS) 5 MG TABS tablet Take 1 tablet (5 mg total) by mouth 2 (two) times daily. (Patient not taking: Reported on 02/06/2019) 60 tablet 11   No current facility-administered medications for this visit.     OBJECTIVE: Vitals:   02/06/19 1527  BP: 122/81  Pulse: 75  Resp: 18  Temp: (!) 96 F (35.6 C)  SpO2: 99%     Body mass index is 30.33 kg/m.    ECOG FS:0 - Asymptomatic  General: Well-developed, well-nourished, no acute distress. Eyes: Pink conjunctiva, anicteric sclera. HEENT: Normocephalic, moist mucous membranes, clear oropharnyx. Lungs: Clear to auscultation bilaterally. Heart: Regular rate and rhythm. No rubs, murmurs, or gallops. Abdomen: Soft, nontender, nondistended. No organomegaly noted, normoactive bowel sounds. Musculoskeletal: No edema, cyanosis, or clubbing. Neuro: Alert, answering all questions appropriately. Cranial nerves grossly intact. Skin: No rashes or petechiae noted. Psych: Normal affect. Lymphatics: No cervical, calvicular, axillary or inguinal LAD.   LAB RESULTS:  Lab Results  Component Value Date    NA 136 01/23/2019   K 3.9 01/23/2019   CL 106 01/23/2019   CO2 25 01/23/2019   GLUCOSE 114 (H) 01/23/2019   BUN 25 (H) 01/23/2019   CREATININE 0.94 01/23/2019   CALCIUM 8.0 (L) 01/23/2019   GFRNONAA >60 01/23/2019   GFRAA >60 01/23/2019    Lab Results  Component Value Date   WBC 6.7 01/23/2019   NEUTROABS 5.1 01/22/2019   HGB 11.6 (L) 01/23/2019   HCT 34.0 (L) 01/23/2019   MCV 87.2 01/23/2019   PLT 126 (L) 01/23/2019     STUDIES: Ct Angio Chest Pe W And/or Wo Contrast  Result Date: 01/22/2019 CLINICAL DATA:  58 year old male with shortness of breath and syncope. EXAM: CT ANGIOGRAPHY CHEST WITH CONTRAST TECHNIQUE: Multidetector CT imaging of the chest was performed using the standard protocol during bolus administration of intravenous contrast. Multiplanar CT image reconstructions and MIPs were obtained to evaluate the vascular anatomy. CONTRAST:  10mL OMNIPAQUE IOHEXOL 350 MG/ML SOLN COMPARISON:  01/22/2019 chest radiograph FINDINGS: Cardiovascular: This is a technically satisfactory study. Acute pulmonary emboli are noted at the bifurcation of the main pulmonary artery and extending into both main pulmonary arteries and multiple bilateral pulmonary segments. RV/LV ratio measures 1.3 compatible with CT evidence of RIGHT heart strain. A small pericardial effusion is noted. No thoracic aortic aneurysm. Coronary artery atherosclerotic calcifications are present. Mediastinum/Nodes: No enlarged mediastinal, hilar, or axillary lymph nodes. Thyroid gland, trachea, and esophagus demonstrate no significant findings. Lungs/Pleura: Mild dependent and bibasilar atelectasis noted. No airspace disease, consolidation, mass, pleural effusion or pneumothorax. Upper Abdomen: Question mild hepatic steatosis. No acute abnormality. Musculoskeletal: No acute or suspicious bony abnormality. Review of the MIP images confirms the above findings. IMPRESSION: 1. Acute large clot burden bilateral pulmonary emboli with  CT evidence of RIGHT heart strain. 2. Mild dependent/bibasilar atelectasis. 3. Coronary artery disease.  Small pericardial effusion. 4. Question hepatic steatosis. Critical Value/emergent results were called by telephone at the time of interpretation on 01/22/2019 at 5:29 pm to [facility] , who verbally acknowledged these results. Electronically Signed   By: Margarette Canada M.D.   On: 01/22/2019 17:30   Mr Abdomen Wwo Contrast  Result Date: 02/04/2019 CLINICAL DATA:  Renal mass EXAM: MRI ABDOMEN WITHOUT AND WITH CONTRAST TECHNIQUE: Multiplanar multisequence MR imaging of the abdomen was performed both before and after the administration of intravenous contrast. CONTRAST:  9mL GADAVIST GADOBUTROL 1 MMOL/ML IV SOLN COMPARISON:  CT abdomen/pelvis dated 02/02/2019 FINDINGS: Lower chest: Lung bases are clear. Hepatobiliary: Liver is notable for mild hepatic steatosis. No suspicious/enhancing hepatic lesions. Gallbladder sludge. No cholelithiasis or gallbladder wall thickening. No pericholecystic fluid. No intrahepatic or extrahepatic ductal dilatation. No choledocholithiasis is seen. Pancreas:  Within normal limits. Spleen:  Within normal limits. Adrenals/Urinary Tract:  Adrenal glands are within normal limits. 6.9 x 8.9 x 7.4 cm medial right lower pole renal sinus cyst (series 6/image 26), benign (Bosniak I). The cyst does not compress the infrahepatic IVC. Additional small bilateral renal cysts, measuring up to 2.3 cm in the right upper kidney (series 6/image 17) and 3.0 cm in the medial left lower kidney (series 6/image 29), benign (Bosniak I). 9 x 15 mm cyst along the anterior left upper kidney (series 6/image 22), with intrinsic T1 hyperintensity on precontrast T1 imaging (series 11/image 46), compatible with a benign hemorrhagic cyst (Bosniak II). No enhancing renal lesions.  No hydronephrosis. Stomach/Bowel: Stomach is within normal limits. Visualized bowel is unremarkable. Vascular/Lymphatic:  No  evidence of abdominal aortic aneurysm. No suspicious abdominal lymphadenopathy. Circumaortic left renal vein. Other:  No abdominal ascites. Musculoskeletal: No focal osseous lesions. IMPRESSION: 8.9 cm medial right lower pole renal sinus cyst, benign (Bosniak I). The cyst does not compress the infrahepatic IVC. 15 mm hemorrhagic cyst along the anterior left upper kidney, benign (Bosniak II). Additional simple bilateral renal cysts, benign (Bosniak I). No enhancing renal lesions. Mild hepatic steatosis. Electronically Signed   By: Julian Hy M.D.   On: 02/04/2019 15:08   Ct Abdomen Pelvis W Contrast  Result Date: 02/02/2019 CLINICAL DATA:  Recent pulmonary emboli and deep venous thrombosis. Concern for abdominal mass causing compression and resulting in thrombus EXAM: CT ABDOMEN AND PELVIS WITH CONTRAST TECHNIQUE: Multidetector CT imaging of the abdomen and pelvis was performed using the standard protocol following bolus administration of intravenous contrast. Oral contrast was also administered. CONTRAST:  145mL OMNIPAQUE IOHEXOL 300 MG/ML  SOLN COMPARISON:  February 10, 2007 FINDINGS: Lower chest:  No lung base edema or consolidation evident. There is a small hiatal hernia. Hepatobiliary: There is hepatic steatosis. No focal liver lesions are appreciable. Gallbladder wall is not appreciably thickened. There is no biliary duct dilatation. Pancreas: There is no pancreatic mass or inflammatory focus. Spleen: No splenic lesions are evident. There is a small accessory spleen anterior to the spleen. Adrenals/Urinary Tract: Adrenals bilaterally appear normal. There is a cyst arising from the mid right kidney measuring 9.3 x 7.6 x 7.3 cm. This cyst extends medially to the level of the inferior vena cava. There is modest narrowing of the inferior vena cava at the site of this cyst, although the cyst does not directly impress upon the inferior vena cava. Elsewhere, there is a cyst in the upper pole of the right  kidney measuring 2.3 x 2.2 cm. There is a cyst arising from the medial lower pole left kidney measuring 2.9 x 2.4 cm. There is no hydronephrosis on either side. Note that the large cyst on the right does impress upon the right extrarenal pelvis. There is no evident renal or ureteral calculus on either side. Urinary bladder is midline with wall thickness within normal limits. Stomach/Bowel: There are multiple sigmoid and descending colonic diverticula without diverticulitis. There is moderate stool throughout the colon. There is no appreciable bowel wall or mesenteric thickening. There is no evident bowel obstruction. The terminal ileum appears normal. There is no evident free air or portal venous air. Vascular/Lymphatic: There is no abdominal aortic aneurysm. There is aortic and iliac artery atherosclerosis. Major venous structures appear patent on this study. Slight narrowing of the inferior vena cava at the site of the large medial right renal cyst noted. Note that there is a circumaortic left renal vein, an anatomic variant. No adenopathy is evident in the abdomen or pelvis. Reproductive: Prostate and seminal vesicles appear normal in size and contour. No pelvic masses are evident. Other: The appendix appears normal. No abscess or ascites is evident in the abdomen or pelvis. There is no abdominal or pelvic mass beyond the renal cysts noted above. Musculoskeletal: No blastic or lytic bone lesions. There are foci of degenerative change in the thoracic spine. There are no intramuscular lesions. No abdominal wall lesions appreciable. IMPRESSION: 1. There is a cyst arising from the medial mid right kidney measuring 9.3 x 7.6 x 7.3 cm. This cyst extends medially to a level immediately adjacent to the inferior vena cava. There is slight localized narrowing of the inferior vena cava at the site of the cyst without direct impression of the cyst upon the inferior vena cava with supine positioning. There is no obstruction  of the inferior vena cava in this area. No other large mass evident in the abdomen or pelvis. There are small renal cysts on each side. 2. There are multiple sigmoid and descending colonic diverticula without diverticulitis. No bowel obstruction. No abscess in the abdomen or pelvis. Appendix appears normal. 3.  Small hiatal hernia. 4. No renal or ureteral calculi. No hydronephrosis. Urinary bladder wall thickness normal. 5.  Aortic Atherosclerosis (ICD10-I70.0). 6.  Hepatic steatosis. Electronically Signed   By: Lowella Grip III M.D.   On: 02/02/2019 12:08   US Venous Img Lower Bilateral  Result Date: 01/23/2019 CLINICAL DATA:  History of pulmonary embolism, post catheter directed thrombectomy. Evaluate for lower extremity DVT. EXAM: BILATERAL LOWER EXTREMITY VENOUS DOPPLER ULTRASOUND TECHNIQUE: Gray-scale sonography with graded compression, as well as color Doppler and duplex ultrasound were performed to evaluate the lower extremity deep  venous systems from the level of the common femoral vein and including the common femoral, femoral, profunda femoral, popliteal and calf veins including the posterior tibial, peroneal and gastrocnemius veins when visible. The superficial great saphenous vein was also interrogated. Spectral Doppler was utilized to evaluate flow at rest and with distal augmentation maneuvers in the common femoral, femoral and popliteal veins. COMPARISON:  Chest CTA-01/22/2019 FINDINGS: RIGHT LOWER EXTREMITY Common Femoral Vein: Not evaluated due to bandage from thrombectomy access site. Saphenofemoral Junction: Not evaluated due to bandaging from thrombectomy access site. Profunda Femoral Vein: No evidence of thrombus. Normal compressibility and flow on color Doppler imaging. Femoral Vein: While the proximal (image 6) and mid (image 8) aspects of the right femoral vein appear widely patent, there is mixed echogenic nonocclusive thrombus seen within distal aspect of the right femoral vein  (representative image 12 and 13). Popliteal Vein: Mixed echogenic nonocclusive thrombus is seen throughout the right popliteal vein (images 16 through 22). Calf Veins: No evidence of thrombus. Normal compressibility and flow on color Doppler imaging. Superficial Great Saphenous Vein: No evidence of thrombus. Normal compressibility. Other Findings:  None. LEFT LOWER EXTREMITY Common Femoral Vein: No evidence of thrombus. Normal compressibility, respiratory phasicity and response to augmentation. Saphenofemoral Junction: No evidence of thrombus. Normal compressibility and flow on color Doppler imaging. Profunda Femoral Vein: No evidence of thrombus. Normal compressibility and flow on color Doppler imaging. Femoral Vein: No evidence of thrombus. Normal compressibility, respiratory phasicity and response to augmentation. Popliteal Vein: No evidence of thrombus. Normal compressibility, respiratory phasicity and response to augmentation. Calf Veins: No evidence of thrombus. Normal compressibility and flow on color Doppler imaging. Superficial Great Saphenous Vein: No evidence of thrombus. Normal compressibility. Other Findings:  None. IMPRESSION: 1. Examination is positive for short segment nonocclusive DVT involving the distal aspect the right femoral vein as well as the right popliteal vein. 2. No evidence of DVT within the left lower extremity. Electronically Signed   By: Sandi Mariscal M.D.   On: 01/23/2019 11:58   Dg Chest Port 1 View  Result Date: 01/22/2019 CLINICAL DATA:  Dyspnea, diaphoresis, syncopal episode EXAM: PORTABLE CHEST 1 VIEW COMPARISON:  08/23/2005 FINDINGS: The heart size and mediastinal contours are within normal limits. Both lungs are clear. The visualized skeletal structures are unremarkable. IMPRESSION: No active disease. Electronically Signed   By: Kathreen Devoid   On: 01/22/2019 16:12    ASSESSMENT: Pulmonary embolism, DVT  PLAN:    1. Pulmonary embolism, DVT: Patient appears to have no  transient risk factors.  Imaging results reviewed independently and reported as above with no obvious underlying malignancy.  Patient is undergone thrombectomy with thrombolysis with significant improvement of his symptoms.  Continue Eliquis which she will require a minimum of 6 months of treatment.  Will initiate full hypercoagulable work-up at this time.  Patient will return to clinic in 3 weeks with a video assisted telemedicine visit to discuss the results. 2.  Renal cyst: Appreciate urology input.  Cyst does not appear to be compressing the IVC.  No intervention is needed at this time.  Continue to monitor. 3.  Hematuria: Patient had one episode of hematuria in Spring 2020, given the associated pain and transient nature this was likely a kidney stone.  He has had no recurrence. 4.  Colonoscopy: Patient reports he has a colonoscopy in early November.  Proceed with procedure as planned.  Patient will likely need a Lovenox bridge surrounding the procedure.  I spent a total of 60  minutes face-to-face with the patient of which greater than 50% of the visit was spent in counseling and coordination of care as detailed above.   Patient expressed understanding and was in agreement with this plan. He also understands that He can call clinic at any time with any questions, concerns, or complaints.    Lloyd Huger, MD   02/07/2019 8:11 AM

## 2019-02-05 NOTE — Progress Notes (Signed)
Tumor Board Documentation  Nathan Scott was presented by Dr Grayland Ormond at our Tumor Board on 02/05/2019, which included representatives from medical oncology, radiation oncology, pathology, radiology, surgical, navigation, internal medicine, pharmacy, pulmonology, research.  Nathan Scott currently presents as a new patient, for discussion with history of the following treatments: active survellience.  Additionally, we reviewed previous medical and familial history, history of present illness, and recent lab results along with all available histopathologic and imaging studies. The tumor board considered available treatment options and made the following recommendations: Additional screening Seeing Urology,  See Medical Oncology for Hyercoagulation work up  The following procedures/referrals were also placed: No orders of the defined types were placed in this encounter.   Clinical Trial Status: not discussed   Staging used: Not Applicable  National site-specific guidelines   were discussed with respect to the case.  Tumor board is a meeting of clinicians from various specialty areas who evaluate and discuss patients for whom a multidisciplinary approach is being considered. Final determinations in the plan of care are those of the provider(s). The responsibility for follow up of recommendations given during tumor board is that of the provider.   Today's extended care, comprehensive team conference, Nathan Scott was not present for the discussion and was not examined.   Multidisciplinary Tumor Board is a multidisciplinary case peer review process.  Decisions discussed in the Multidisciplinary Tumor Board reflect the opinions of the specialists present at the conference without having examined the patient.  Ultimately, treatment and diagnostic decisions rest with the primary provider(s) and the patient.

## 2019-02-06 ENCOUNTER — Encounter: Payer: Self-pay | Admitting: Oncology

## 2019-02-06 ENCOUNTER — Other Ambulatory Visit
Admission: RE | Admit: 2019-02-06 | Discharge: 2019-02-06 | Disposition: A | Discharge status: Home / Self Care | Payer: Commercial Managed Care - PPO | Attending: Urology | Admitting: Urology

## 2019-02-06 ENCOUNTER — Other Ambulatory Visit: Payer: Self-pay

## 2019-02-06 ENCOUNTER — Inpatient Hospital Stay: Payer: Commercial Managed Care - PPO

## 2019-02-06 ENCOUNTER — Inpatient Hospital Stay: Payer: Commercial Managed Care - PPO | Attending: Oncology | Admitting: Oncology

## 2019-02-06 ENCOUNTER — Ambulatory Visit (INDEPENDENT_AMBULATORY_CARE_PROVIDER_SITE_OTHER): Payer: Commercial Managed Care - PPO | Admitting: Urology

## 2019-02-06 ENCOUNTER — Encounter: Payer: Self-pay | Admitting: Urology

## 2019-02-06 VITALS — BP 118/85 | HR 82 | Ht 72.0 in | Wt 215.0 lb

## 2019-02-06 VITALS — BP 122/81 | HR 75 | Temp 96.0°F | Resp 18 | Wt 223.6 lb

## 2019-02-06 DIAGNOSIS — I2602 Saddle embolus of pulmonary artery with acute cor pulmonale: Secondary | ICD-10-CM

## 2019-02-06 DIAGNOSIS — I2692 Saddle embolus of pulmonary artery without acute cor pulmonale: Secondary | ICD-10-CM | POA: Diagnosis not present

## 2019-02-06 DIAGNOSIS — R0602 Shortness of breath: Secondary | ICD-10-CM | POA: Diagnosis not present

## 2019-02-06 DIAGNOSIS — N281 Cyst of kidney, acquired: Secondary | ICD-10-CM

## 2019-02-06 DIAGNOSIS — R310 Gross hematuria: Secondary | ICD-10-CM

## 2019-02-06 DIAGNOSIS — Z7901 Long term (current) use of anticoagulants: Secondary | ICD-10-CM

## 2019-02-06 LAB — URINALYSIS, COMPLETE (UACMP) WITH MICROSCOPIC
Bilirubin Urine: NEGATIVE
Glucose, UA: NEGATIVE mg/dL
Hgb urine dipstick: NEGATIVE
Ketones, ur: NEGATIVE mg/dL
Leukocytes,Ua: NEGATIVE
Nitrite: NEGATIVE
Protein, ur: NEGATIVE mg/dL
RBC / HPF: NONE SEEN RBC/hpf (ref 0–5)
Specific Gravity, Urine: 1.02 (ref 1.005–1.030)
pH: 7 (ref 5.0–8.0)

## 2019-02-06 LAB — ANTITHROMBIN III: AntiThromb III Func: 105 % (ref 75–120)

## 2019-02-06 NOTE — Progress Notes (Signed)
New patient visit for hypercoagulable state.  Pt with high stress levels at work, wife has breast cancer and his current health issues.

## 2019-02-06 NOTE — Progress Notes (Signed)
02/06/2019 10:12 AM   Nathan Scott Nathan Scott September 15, 1960 [id number]  Referring provider: Rusty Aus, MD [facility] [place],  [place] [address]  Chief Complaint  Patient presents with  . Renal Cyst    New Patient    HPI: Pleasant 58 year old male who presents today for further evaluation of an episode of painless gross hematuria as well as renal cyst.  Notably, he recently was diagnosed with significant pulmonary embolus, saddle with right heart strain secondary to a left DVT.  He is on anticoagulation after undergoing thrombolysis/thrombectomy.  As part of his hypercoagulability work-up, he had a CT abdomen pelvis to assess for any possible underlying malignancies.  Incidental bilateral renal cysts are identified, right greater than left with questionable compression of the IVC.  This is followed up with an MRI indicating Bosniak 1 and 2 renal cysts, largest on the right measuring 8.9 x 9.6 x 7.4 cm.  Notably on MR, the cyst was not seen as compressive to the intrahepatic IVC.  He denies any flank pain.  He did have an episode of painless gross hematuria back in April.  He reports that he was out walking his dog early in the morning and decided to void outside.  He had some discomfort and noticed gross hematuria and passing a large clot.  No flank pain associated with this.  Resolved later that day.  No dysuria or gross hematuria.  No smoking history.  No biochemical exposure.  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  PSA screening up-to-date.  PMH: Past Medical History:  Diagnosis Date  . 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 GERD (gastroesophageal reflux disease)   . Sleep apnea    no cpap x 8 years    Surgical History: Past Surgical History:  Procedure Laterality Date  . FRACTURE SURGERY Left    pinning leg fx  . LEFT HEART CATH AND CORONARY ANGIOGRAPHY Left  01/12/2019   Procedure: LEFT HEART CATH AND CORONARY ANGIOGRAPHY;  Surgeon: Teodoro Spray, MD;  Location: [facility];  Service: Cardiovascular;  Laterality: Left;  . NASAL SEPTOPLASTY W/ TURBINOPLASTY Bilateral 02/20/2016   Procedure: NASAL SEPTOPLASTY WITH TURBINATE REDUCTION;  Surgeon: Margaretha Sheffield, MD;  Location: [facility];  Service: ENT;  Laterality: Bilateral;  . PULMONARY THROMBECTOMY Bilateral 01/22/2019   Procedure: PULMONARY THROMBECTOMY;  Surgeon: Katha Cabal, MD;  Location: [facility];  Service: Cardiovascular;  Laterality: Bilateral;    Home Medications:  Allergies as of 02/06/2019   No Known Allergies     Medication List       Accurate as of February 06, 2019 10:12 AM. If you have any questions, ask your nurse or doctor.        ALPRAZolam 0.5 MG tablet Commonly known as: XANAX Take 0.5 mg by mouth at bedtime as needed for sleep.   APPLE CIDER VINEGAR PO Take 3 capsules by mouth daily.   aspirin EC 81 MG tablet Take 81 mg by mouth daily.   cholecalciferol 25 MCG (1000 UT) tablet Commonly known as: VITAMIN D Take 1,000 Units by mouth daily.   cyclobenzaprine 10 MG tablet Commonly known as: FLEXERIL Take 10 mg by mouth daily as needed for muscle spasms.   Eliquis DVT/PE Starter Pack 5 MG Tabs Take as directed on package: start with two-5mg  tablets twice daily for 7 days. On day 8,  switch to one-5mg  tablet twice daily.   apixaban 5 MG Tabs tablet Commonly known as: ELIQUIS Take 1 tablet (5 mg total) by mouth 2 (two) times daily.   Fish Oil 1000 MG Caps Take 2,000 mg by mouth daily.   Flaxseed Oil 1200 MG Caps Take 2,400 mg by mouth daily.   hydrochlorothiazide 25 MG tablet Commonly known as: HYDRODIURIL Take 25 mg by mouth daily.   Magnesium Oxide 500 MG Tabs Take 500 mg by mouth daily.   meloxicam 15 MG tablet Commonly known as: MOBIC Take 15 mg by mouth daily.   Nasacort Allergy 24HR 55 MCG/ACT Aero nasal inhaler  Generic drug: triamcinolone Place 2 sprays into the nose daily.   omeprazole 40 MG capsule Commonly known as: PRILOSEC Take 40 mg by mouth daily before breakfast.   pyridOXINE 100 MG tablet Commonly known as: VITAMIN B-6 Take 100 mg by mouth daily.   rOPINIRole 2 MG 24 hr tablet Commonly known as: REQUIP XL Take 2 mg by mouth at bedtime.   sertraline 50 MG tablet Commonly known as: ZOLOFT Take 50 mg by mouth at bedtime.   simvastatin 20 MG tablet Commonly known as: ZOCOR Take 20 mg by mouth at bedtime.   triamcinolone cream 0.1 % Commonly known as: KENALOG Apply 1 application topically 2 (two) times daily as needed (for itching legs.).       Allergies: No Known Allergies  Family History: No family history on file.  Social History:  reports that he has never smoked. He has never used smokeless tobacco. He reports that he does not drink alcohol or use drugs.  ROS: UROLOGY Frequent Urination?: No Hard to postpone urination?: No Burning/pain with urination?: No Get up at night to urinate?: No Leakage of urine?: No Urine stream starts and stops?: No Trouble starting stream?: No Do you have to strain to urinate?: No Blood in urine?: No Urinary tract infection?: No Sexually transmitted disease?: No Injury to kidneys or bladder?: No Painful intercourse?: No Weak stream?: No Erection problems?: No Penile pain?: No  Gastrointestinal Nausea?: No Vomiting?: No Indigestion/heartburn?: No Diarrhea?: No Constipation?: No  Constitutional Fever: No Night sweats?: No Weight loss?: No Fatigue?: No  Skin Skin rash/lesions?: No Itching?: No  Eyes Blurred vision?: No Double vision?: No  Ears/Nose/Throat Sore throat?: No Sinus problems?: No  Hematologic/Lymphatic Swollen glands?: No Easy bruising?: No  Cardiovascular Leg swelling?: No Chest pain?: No  Respiratory Cough?: No Shortness of breath?: No  Endocrine Excessive thirst?: No   Musculoskeletal Back pain?: No Joint pain?: No  Neurological Headaches?: No Dizziness?: No  Psychologic Depression?: No Anxiety?: No  Physical Exam: BP 118/85   Pulse 82   Ht 6' (1.829 m)   Wt 215 lb (97.5 kg)   BMI 29.16 kg/m   Constitutional:  Alert and oriented, No acute distress. HEENT: [place] AT, moist mucus membranes.  Trachea midline, no masses. Cardiovascular: No clubbing, cyanosis, or edema. Respiratory: Normal respiratory effort, no increased work of breathing. GI: Abdomen is soft, nontender, nondistended, no abdominal masses GU: No CVA tenderness Skin: No rashes, bruises or suspicious lesions. Neurologic: Grossly intact, no focal deficits, moving all 4 extremities. Psychiatric: Normal mood and affect.  Laboratory Data: Lab Results  Component Value Date   WBC 6.7 01/23/2019   HGB 11.6 (L) 01/23/2019   HCT 34.0 (L) 01/23/2019   MCV 87.2 01/23/2019   PLT 126 (L) 01/23/2019    Lab Results  Component Value Date   CREATININE 0.94 01/23/2019  Urinalysis  Component     Latest Ref Rng & Units 02/06/2019  Color, Urine     YELLOW YELLOW  Appearance     CLEAR CLEAR  Specific Gravity, Urine     1.005 - 1.030 1.020  pH     5.0 - 8.0 7.0  Glucose, UA     NEGATIVE mg/dL NEGATIVE  Hgb urine dipstick     NEGATIVE NEGATIVE  Bilirubin Urine     NEGATIVE NEGATIVE  Ketones, ur     NEGATIVE mg/dL NEGATIVE  Protein     NEGATIVE mg/dL NEGATIVE  Nitrite     NEGATIVE NEGATIVE  Leukocytes,Ua     NEGATIVE NEGATIVE  Squamous Epithelial / LPF     0 - 5 0-5  WBC, UA     0 - 5 WBC/hpf 0-5  RBC / HPF     0 - 5 RBC/hpf NONE SEEN  Bacteria, UA     NONE SEEN RARE (A)  Amorphous Crystal      PRESENT   Pertinent Imaging: CLINICAL DATA:  Renal mass  EXAM: MRI ABDOMEN WITHOUT AND WITH CONTRAST  TECHNIQUE: Multiplanar multisequence MR imaging of the abdomen was performed both before and after the administration of intravenous contrast.  CONTRAST:  64mL  GADAVIST GADOBUTROL 1 MMOL/ML IV SOLN  COMPARISON:  CT abdomen/pelvis dated 02/02/2019  FINDINGS: Lower chest: Lung bases are clear.  Hepatobiliary: Liver is notable for mild hepatic steatosis. No suspicious/enhancing hepatic lesions.  Gallbladder sludge. No cholelithiasis or gallbladder wall thickening. No pericholecystic fluid.  No intrahepatic or extrahepatic ductal dilatation. No choledocholithiasis is seen.  Pancreas:  Within normal limits.  Spleen:  Within normal limits.  Adrenals/Urinary Tract:  Adrenal glands are within normal limits.  6.9 x 8.9 x 7.4 cm medial right lower pole renal sinus cyst (series 6/image 26), benign (Bosniak I). The cyst does not compress the infrahepatic IVC.  Additional small bilateral renal cysts, measuring up to 2.3 cm in the right upper kidney (series 6/image 17) and 3.0 cm in the medial left lower kidney (series 6/image 29), benign (Bosniak I). 9 x 15 mm cyst along the anterior left upper kidney (series 6/image 22), with intrinsic T1 hyperintensity on precontrast T1 imaging (series 11/image 46), compatible with a benign hemorrhagic cyst (Bosniak II).  No enhancing renal lesions.  No hydronephrosis.  Stomach/Bowel: Stomach is within normal limits.  Visualized bowel is unremarkable.  Vascular/Lymphatic:  No evidence of abdominal aortic aneurysm.  No suspicious abdominal lymphadenopathy. Circumaortic left renal vein.  Other:  No abdominal ascites.  Musculoskeletal: No focal osseous lesions.  IMPRESSION: 8.9 cm medial right lower pole renal sinus cyst, benign (Bosniak I). The cyst does not compress the infrahepatic IVC.  15 mm hemorrhagic cyst along the anterior left upper kidney, benign (Bosniak II). Additional simple bilateral renal cysts, benign (Bosniak I). No enhancing renal lesions.  Mild hepatic steatosis.   Electronically Signed   By: Julian Hy M.D.   On: 02/04/2019 15:08  MR was  personally reviewed today.  This was also compared to his CT scan from 02/02/2019.  Agree with radiologic interpretation, large Bosniak 1 renal cyst approximately 9 cm which on CT scan appear to have some mass-effect onto the IVC but not really appreciated on MRI.  No obstructive changes.  Assessment & Plan:    1. Gross hematuria In the setting of recent massive PE/DVT and episode of painless gross hematuria, I do think it is important to pursue hematuria evaluation rule out underlying malignancy in  the bladder.  We discussed cystoscopy at length.  He does not have delayed imaging in the form of urogram, however he is a never smoker thus relatively low risk and thus will defer delayed imaging at this time.  He understands and is agreeable this plan. - Urinalysis, Complete w Microscopic; Future  2. Acquired renal cyst of right kidney Incidental renal cyst, Bosniak 1 and 2 none of which are concerning for underlying malignancy, all benign.  Initially, there is thought to be some mass-effect from the right medial nonsegmental renal cyst on the IVC but the MRI does not reflect this.  I reviewed the images with Dr. Delana Meyer as well who does not feel that the compression from the cyst on the IVC is significant enough to be a risk factor for initiative compressive or stasis component contributing to his DVT/PE presentation.  Reviewed today that DVT/PE has been a potential causes, sometimes a cause is never identified.  I have encouraged him to work with Dr. Grayland Ormond to complete his hypercoagulable work-up and work to the list of possible underlying risk factors.  Of absolutely no retractors are identified, we can discuss the cyst again on the road, however given the cyst is extremely unlikely to be the cause and the risk of the invention itself, not certain of the risks benefits would outweigh today.  He understands this completely.  His wife is available via speaker phone today to engage in this conversation  as well.  He is otherwise asymptomatic from this cyst, no flank pain.  Cystoscopy next week  Hollice Espy, MD  [facility] [address], [place], [place] [address] [phone]  I spent 45 min with this patient of which greater than 50% was spent in counseling and coordination of care with the patient.

## 2019-02-06 NOTE — Patient Instructions (Signed)
Cystoscopy Cystoscopy is a procedure that is used to help diagnose and sometimes treat conditions that affect the lower urinary tract. The lower urinary tract includes the bladder and the urethra. The urethra is the tube that drains urine from the bladder. Cystoscopy is done using a thin, tube-shaped instrument with a light and camera at the end (cystoscope). The cystoscope may be hard or flexible, depending on the goal of the procedure. The cystoscope is inserted through the urethra, into the bladder. Cystoscopy may be recommended if you have:  Urinary tract infections that keep coming back.  Blood in the urine (hematuria).  An inability to control when you urinate (urinary incontinence) or an overactive bladder.  Unusual cells found in a urine sample.  A blockage in the urethra, such as a urinary stone.  Painful urination.  An abnormality in the bladder found during an intravenous pyelogram (IVP) or CT scan. Cystoscopy may also be done to remove a sample of tissue to be examined under a microscope (biopsy). Tell a health care provider about:  Any allergies you have.  All medicines you are taking, including vitamins, herbs, eye drops, creams, and over-the-counter medicines.  Any problems you or family members have had with anesthetic medicines.  Any blood disorders you have.  Any surgeries you have had.  Any medical conditions you have.  Whether you are pregnant or may be pregnant. What are the risks? Generally, this is a safe procedure. However, problems may occur, including:  Infection.  Bleeding.  Allergic reactions to medicines.  Damage to other structures or organs. What happens before the procedure?  Ask your health care provider about: ? Changing or stopping your regular medicines. This is especially important if you are taking diabetes medicines or blood thinners. ? Taking medicines such as aspirin and ibuprofen. These medicines can thin your blood. Do not take  these medicines unless your health care provider tells you to take them. ? Taking over-the-counter medicines, vitamins, herbs, and supplements.  Follow instructions from your health care provider about eating or drinking restrictions.  Ask your health care provider what steps will be taken to help prevent infection. These may include: ? Washing skin with a germ-killing soap. ? Taking antibiotic medicine.  You may have an exam or testing, such as: ? X-rays of the bladder, urethra, or kidneys. ? Urine tests to check for signs of infection.  Plan to have someone take you home from the hospital or clinic. What happens during the procedure?   You will be given one or more of the following: ? A medicine to help you relax (sedative). ? A medicine to numb the area (local anesthetic).  The area around the opening of your urethra will be cleaned.  The cystoscope will be passed through your urethra into your bladder.  Germ-free (sterile) fluid will flow through the cystoscope to fill your bladder. The fluid will stretch your bladder so that your health care provider can clearly examine your bladder walls.  Your doctor will look at the urethra and bladder. Your doctor may take a biopsy or remove stones.  The cystoscope will be removed, and your bladder will be emptied. The procedure may vary among health care providers and hospitals. What can I expect after the procedure? After the procedure, it is common to have:  Some soreness or pain in your abdomen and urethra.  Urinary symptoms. These include: ? Mild pain or burning when you urinate. Pain should stop within a few minutes after you urinate. This   may last for up to 1 week. ? A small amount of blood in your urine for several days. ? Feeling like you need to urinate but producing only a small amount of urine. Follow these instructions at home: Medicines  Take over-the-counter and prescription medicines only as told by your health care  provider.  If you were prescribed an antibiotic medicine, take it as told by your health care provider. Do not stop taking the antibiotic even if you start to feel better. General instructions  Return to your normal activities as told by your health care provider. Ask your health care provider what activities are safe for you.  Do not drive for 24 hours if you were given a sedative during your procedure.  Watch for any blood in your urine. If the amount of blood in your urine increases, call your health care provider.  Follow instructions from your health care provider about eating or drinking restrictions.  If a tissue sample was removed for testing (biopsy) during your procedure, it is up to you to get your test results. Ask your health care provider, or the department that is doing the test, when your results will be ready.  Drink enough fluid to keep your urine pale yellow.  Keep all follow-up visits as told by your health care provider. This is important. Contact a health care provider if you:  Have pain that gets worse or does not get better with medicine, especially pain when you urinate.  Have trouble urinating.  Have more blood in your urine. Get help right away if you:  Have blood clots in your urine.  Have abdominal pain.  Have a fever or chills.  Are unable to urinate. Summary  Cystoscopy is a procedure that is used to help diagnose and sometimes treat conditions that affect the lower urinary tract.  Cystoscopy is done using a thin, tube-shaped instrument with a light and camera at the end.  After the procedure, it is common to have some soreness or pain in your abdomen and urethra.  Watch for any blood in your urine. If the amount of blood in your urine increases, call your health care provider.  If you were prescribed an antibiotic medicine, take it as told by your health care provider. Do not stop taking the antibiotic even if you start to feel better. This  information is not intended to replace advice given to you by your health care provider. Make sure you discuss any questions you have with your health care provider. Document Released: 03/30/2000 Document Revised: 03/25/2018 Document Reviewed: 03/25/2018 Elsevier Patient Education  2020 [employer]vier Inc[employer]  

## 2019-02-07 LAB — HOMOCYSTEINE: Homocysteine: 8 umol/L (ref 0.0–14.5)

## 2019-02-07 LAB — PROTEIN C, TOTAL: Protein C, Total: 116 % (ref 60–150)

## 2019-02-08 LAB — PROTEIN S, TOTAL: Protein S Ag, Total: 95 % (ref 60–150)

## 2019-02-08 LAB — LUPUS ANTICOAGULANT PANEL
DRVVT: 32.1 s (ref 0.0–47.0)
PTT Lupus Anticoagulant: 30.8 s (ref 0.0–51.9)

## 2019-02-08 LAB — PROTEIN S ACTIVITY: Protein S Activity: 127 % (ref 63–140)

## 2019-02-08 LAB — PROTEIN C ACTIVITY: Protein C Activity: 123 % (ref 73–180)

## 2019-02-09 LAB — CARDIOLIPIN ANTIBODIES, IGG, IGM, IGA
Anticardiolipin IgA: <9 APL U/mL (ref 0–11)
Anticardiolipin IgG: <9 GPL U/mL (ref 0–14)
Anticardiolipin IgM: <9 MPL U/mL (ref 0–12)

## 2019-02-10 ENCOUNTER — Telehealth: Payer: Self-pay

## 2019-02-10 NOTE — Telephone Encounter (Signed)
Discussed colonoscopy with Dr. Allen Norris.  Dr. Allen Norris advised that patient can have his colonoscopy without stopping Eliquis.  Patient has been informed of this.    His question was what would be the purpose of having the colonoscopy, if polyps may be noted but not able to be removed.  This question was further addressed to Dr. Allen Norris.  Dr. Allen Norris states that if polyp is found during the colonoscopy the visual characteristics of the polyp would still be able to determine the kind of polyp present.  Patient has been made aware that by not being able to stop Eliquis, Dr. Allen Norris will not be able to take any tissue samples or remove any polyps due to bleeding risk.  Patient has verbally expressed understanding and has agreed to move forward to have his colonoscopy as scheduled.  Thanks,  [employer]

## 2019-02-10 NOTE — Telephone Encounter (Signed)
Contacted patient in regards to blood thinner request received from Dr. Nino Parsley office.  Dr. Delana Meyer advised "Patient can not stop blood thinner due to medical necessity". When I informed patient of this, he stated that Dr.Finnegan is planning on doing lovenox bridge.  I thanked patient for this update, and LVM for Dr. Gary Fleet nurse to call me back to discuss.  Thanks [employer]

## 2019-02-11 DIAGNOSIS — I2609 Other pulmonary embolism with acute cor pulmonale: Secondary | ICD-10-CM | POA: Insufficient documentation

## 2019-02-12 LAB — FACTOR 5 LEIDEN

## 2019-02-12 LAB — PROTHROMBIN GENE MUTATION

## 2019-02-13 ENCOUNTER — Encounter: Payer: Self-pay | Admitting: Urology

## 2019-02-13 ENCOUNTER — Other Ambulatory Visit: Payer: Self-pay

## 2019-02-13 ENCOUNTER — Ambulatory Visit (INDEPENDENT_AMBULATORY_CARE_PROVIDER_SITE_OTHER): Payer: Commercial Managed Care - PPO | Admitting: Urology

## 2019-02-13 VITALS — BP 120/87 | HR 84 | Ht 73.0 in | Wt 211.0 lb

## 2019-02-13 DIAGNOSIS — R310 Gross hematuria: Secondary | ICD-10-CM | POA: Diagnosis not present

## 2019-02-13 DIAGNOSIS — N281 Cyst of kidney, acquired: Secondary | ICD-10-CM

## 2019-02-13 NOTE — Progress Notes (Signed)
   02/13/19  CC:  Chief Complaint  Patient presents with  . Cysto    HPI: 58 year old male with a personal history of gross hematuria, recent PE secondary to DVT and large Bosniak 1 renal cyst who presents today for cystoscopy for his hematuria evaluation.  Please see previous notes for details.  Blood pressure 120/87, pulse 84, height 6\' 1"  (1.854 m), weight 211 lb (95.7 kg). NED. A&Ox3.   No respiratory distress   Abd soft, NT, ND Normal phallus with bilateral descended testicles  Cystoscopy Procedure Note  Patient identification was confirmed, informed consent was obtained, and patient was prepped using Betadine solution.  Lidocaine jelly was administered per urethral meatus.     Pre-Procedure: - Inspection reveals a normal caliber ureteral meatus.  Procedure: The flexible cystoscope was introduced without difficulty - No urethral strictures/lesions are present. - Normal prostate  - Normal bladder neck - Bilateral ureteral orifices identified - Bladder mucosa  reveals no ulcers, tumors, or lesions - No bladder stones - No trabeculation  Retroflexion unremarkable   Post-Procedure: - Patient did not tolerate the procedure particularly well, had a bout of nausea during the procedure which resolved very quickly after cessation.  Assessment/ Plan:  1. Gross hematuria No evidence of bladder pathology which is reassuring Findings were discussed with the patient  2. Acquired renal cyst of right kidney As per previous note, large right asymptomatic renal cyst, Bosniak 1  As per our previous discussion, there was some question whether there is some compression of the IVC on CT scan however this was not demonstrated on the MRI.  In addition to this, there is no vessel collateralization or any other sequela of chronic venous compression.  Case was also reviewed with Dr. Delana Meyer, vascular surgery did not feel that this cyst is contributing to his presentation with DVT/PE.  Mr. Bullen is extremely anxious about the cyst.  He is seeking a second opinion at [facility]  Continue to recommend observation rather than intervention for this large renal cyst given that is otherwise asymptomatic especially in the setting of recent thromboembolic event (risks do not outweigh benefits).  F/u as needed  Hollice Espy, MD

## 2019-02-17 ENCOUNTER — Other Ambulatory Visit (INDEPENDENT_AMBULATORY_CARE_PROVIDER_SITE_OTHER): Payer: Self-pay | Admitting: Nurse Practitioner

## 2019-02-20 ENCOUNTER — Other Ambulatory Visit: Payer: Self-pay

## 2019-02-20 ENCOUNTER — Other Ambulatory Visit
Admission: RE | Admit: 2019-02-20 | Discharge: 2019-02-20 | Disposition: A | Discharge status: Home / Self Care | Payer: Commercial Managed Care - PPO | Source: Ambulatory Visit | Attending: Gastroenterology | Admitting: Gastroenterology

## 2019-02-20 DIAGNOSIS — Z20828 Contact with and (suspected) exposure to other viral communicable diseases: Secondary | ICD-10-CM | POA: Insufficient documentation

## 2019-02-20 DIAGNOSIS — Z01812 Encounter for preprocedural laboratory examination: Secondary | ICD-10-CM | POA: Insufficient documentation

## 2019-02-20 LAB — SARS CORONAVIRUS 2 (TAT 6-24 HRS): SARS Coronavirus 2: NEGATIVE

## 2019-02-20 NOTE — Progress Notes (Signed)
[place]  [phone] [phone]  ID: Nathan Scott OB: 07/27/1960  MR#: [id number]  [id number]  Patient Care Team: Rusty Aus, MD as PCP - General (Internal Medicine)  I connected with Nathan Scott on 02/27/19 at  3:30 PM EST by video enabled telemedicine visit and verified that I am speaking with the correct person using two identifiers.   I discussed the limitations, risks, security and privacy concerns of performing an evaluation and management service by telemedicine and the availability of in-person appointments. I also discussed with the patient that there may be a patient responsible charge related to this service. The patient expressed understanding and agreed to proceed.   Other persons participating in the visit and their role in the encounter: Patient, patient's wife, MD  Patients location: Home Providers location: Clinic  CHIEF COMPLAINT: Pulmonary embolism, DVT  INTERVAL HISTORY: Patient agreed to video enabled telemedicine visit for further evaluation and discussion of his laboratory results.  He continues to have mild shortness of breath, but states this has improved over the last several weeks.  He otherwise feels well. He has no neurologic complaints.  He denies any recent fevers or illnesses.  He has a good appetite and denies weight loss.  He has no chest pain, cough, or hemoptysis.  He denies any nausea, vomiting, constipation, or diarrhea.  He has no urinary complaints.  Patient offers no further specific complaints today.  REVIEW OF SYSTEMS:   Review of Systems  Constitutional: Negative.  Negative for fever, malaise/fatigue and weight loss.  Respiratory: Positive for shortness of breath. Negative for cough and hemoptysis.   Cardiovascular: Negative.  Negative for chest pain and leg swelling.  Gastrointestinal: Negative.  Negative for abdominal pain, blood in stool and melena.  Genitourinary: Negative.  Negative  for dysuria.  Musculoskeletal: Negative.  Negative for back pain.  Skin: Negative.  Negative for rash.  Neurological: Negative.  Negative for dizziness, focal weakness, weakness and headaches.  Psychiatric/Behavioral: Negative.  The patient is not nervous/anxious.     As per HPI. Otherwise, a complete review of systems is negative.  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   GERD (gastroesophageal reflux disease)    Pulmonary embolus (HCC)    Sleep apnea    no cpap x 8 years    PAST SURGICAL HISTORY: Past Surgical History:  Procedure Laterality Date   COLONOSCOPY WITH PROPOFOL N/A 02/24/2019   Procedure: COLONOSCOPY WITH PROPOFOL;  Surgeon: Lucilla Lame, MD;  Location: [facility];  Service: Endoscopy;  Laterality: N/A;   FRACTURE SURGERY Left    pinning leg fx   LEFT HEART CATH AND CORONARY ANGIOGRAPHY Left 01/12/2019   Procedure: LEFT HEART CATH AND CORONARY ANGIOGRAPHY;  Surgeon: Teodoro Spray, MD;  Location: [facility];  Service: Cardiovascular;  Laterality: Left;   NASAL SEPTOPLASTY W/ TURBINOPLASTY Bilateral 02/20/2016   Procedure: NASAL SEPTOPLASTY WITH TURBINATE REDUCTION;  Surgeon: Margaretha Sheffield, MD;  Location: [facility];  Service: ENT;  Laterality: Bilateral;   PULMONARY THROMBECTOMY Bilateral 01/22/2019   Procedure: PULMONARY THROMBECTOMY;  Surgeon: Katha Cabal, MD;  Location: [facility];  Service: Cardiovascular;  Laterality: Bilateral;    FAMILY HISTORY: Family History  Problem Relation Age of Onset   Heart attack Father    Clotting disorder Paternal Grandmother     ADVANCED DIRECTIVES (Y/N):  N  HEALTH MAINTENANCE: Social History   Tobacco Use  Smoking status: Never Smoker   Smokeless tobacco: Never Used  Substance Use Topics   Alcohol use: No   Drug use: No     Colonoscopy:  PAP:  Bone density:  Lipid panel:  No Known Allergies  Current Outpatient  Medications  Medication Sig Dispense Refill   ALPRAZolam (XANAX) 0.5 MG tablet Take 0.5 mg by mouth at bedtime as needed for sleep.      apixaban (ELIQUIS) 5 MG TABS tablet Take 1 tablet (5 mg total) by mouth 2 (two) times daily. 60 tablet 11   APPLE CIDER VINEGAR PO Take 3 capsules by mouth daily.      aspirin EC 81 MG tablet Take 81 mg by mouth daily.     cholecalciferol (VITAMIN D) 25 MCG (1000 UT) tablet Take 1,000 Units by mouth daily.     cyclobenzaprine (FLEXERIL) 10 MG tablet Take 10 mg by mouth daily as needed for muscle spasms.      Eliquis DVT/PE Starter Pack (ELIQUIS STARTER PACK) 5 MG TABS Take as directed on package: start with two-5mg  tablets twice daily for 7 days. On day 8, switch to one-5mg  tablet twice daily. 1 each 0   Flaxseed, Linseed, (FLAXSEED OIL) 1200 MG CAPS Take 2,400 mg by mouth daily.     hydrochlorothiazide (HYDRODIURIL) 25 MG tablet Take 25 mg by mouth daily.     Magnesium Oxide 500 MG TABS Take 500 mg by mouth daily.     Omega-3 Fatty Acids (FISH OIL) 1000 MG CAPS Take 2,000 mg by mouth daily.     omeprazole (PRILOSEC) 40 MG capsule Take 40 mg by mouth daily before breakfast.     pyridOXINE (VITAMIN B-6) 100 MG tablet Take 100 mg by mouth daily.     rOPINIRole (REQUIP XL) 2 MG 24 hr tablet Take 2 mg by mouth at bedtime.     sertraline (ZOLOFT) 50 MG tablet Take 50 mg by mouth at bedtime.     simvastatin (ZOCOR) 20 MG tablet Take 20 mg by mouth at bedtime.     triamcinolone (NASACORT ALLERGY 24HR) 55 MCG/ACT AERO nasal inhaler Place 2 sprays into the nose daily.     triamcinolone cream (KENALOG) 0.1 % Apply 1 application topically 2 (two) times daily as needed (for itching legs.).     No current facility-administered medications for this visit.     OBJECTIVE: There were no vitals filed for this visit.   There is no height or weight on file to calculate BMI.    ECOG FS:0 - Asymptomatic  General: Well-developed, well-nourished, no acute  distress. HEENT: Normocephalic. Neuro: Alert, answering all questions appropriately. Cranial nerves grossly intact. Psych: Normal affect.  LAB RESULTS:  Lab Results  Component Value Date   NA 136 01/23/2019   K 3.9 01/23/2019   CL 106 01/23/2019   CO2 25 01/23/2019   GLUCOSE 114 (H) 01/23/2019   BUN 25 (H) 01/23/2019   CREATININE 0.94 01/23/2019   CALCIUM 8.0 (L) 01/23/2019   GFRNONAA >60 01/23/2019   GFRAA >60 01/23/2019    Lab Results  Component Value Date   WBC 6.7 01/23/2019   NEUTROABS 5.1 01/22/2019   HGB 11.6 (L) 01/23/2019   HCT 34.0 (L) 01/23/2019   MCV 87.2 01/23/2019   PLT 126 (L) 01/23/2019     STUDIES: Mr Abdomen Wwo Contrast  Result Date: 02/04/2019 CLINICAL DATA:  Renal mass EXAM: MRI ABDOMEN WITHOUT AND WITH CONTRAST TECHNIQUE: Multiplanar multisequence MR imaging of the abdomen was performed both before  and after the administration of intravenous contrast. CONTRAST:  27mL GADAVIST GADOBUTROL 1 MMOL/ML IV SOLN COMPARISON:  CT abdomen/pelvis dated 02/02/2019 FINDINGS: Lower chest: Lung bases are clear. Hepatobiliary: Liver is notable for mild hepatic steatosis. No suspicious/enhancing hepatic lesions. Gallbladder sludge. No cholelithiasis or gallbladder wall thickening. No pericholecystic fluid. No intrahepatic or extrahepatic ductal dilatation. No choledocholithiasis is seen. Pancreas:  Within normal limits. Spleen:  Within normal limits. Adrenals/Urinary Tract:  Adrenal glands are within normal limits. 6.9 x 8.9 x 7.4 cm medial right lower pole renal sinus cyst (series 6/image 26), benign (Bosniak I). The cyst does not compress the infrahepatic IVC. Additional small bilateral renal cysts, measuring up to 2.3 cm in the right upper kidney (series 6/image 17) and 3.0 cm in the medial left lower kidney (series 6/image 29), benign (Bosniak I). 9 x 15 mm cyst along the anterior left upper kidney (series 6/image 22), with intrinsic T1 hyperintensity on precontrast T1  imaging (series 11/image 46), compatible with a benign hemorrhagic cyst (Bosniak II). No enhancing renal lesions.  No hydronephrosis. Stomach/Bowel: Stomach is within normal limits. Visualized bowel is unremarkable. Vascular/Lymphatic:  No evidence of abdominal aortic aneurysm. No suspicious abdominal lymphadenopathy. Circumaortic left renal vein. Other:  No abdominal ascites. Musculoskeletal: No focal osseous lesions. IMPRESSION: 8.9 cm medial right lower pole renal sinus cyst, benign (Bosniak I). The cyst does not compress the infrahepatic IVC. 15 mm hemorrhagic cyst along the anterior left upper kidney, benign (Bosniak II). Additional simple bilateral renal cysts, benign (Bosniak I). No enhancing renal lesions. Mild hepatic steatosis. Electronically Signed   By: Julian Hy M.D.   On: 02/04/2019 15:08   Ct Abdomen Pelvis W Contrast  Result Date: 02/02/2019 CLINICAL DATA:  Recent pulmonary emboli and deep venous thrombosis. Concern for abdominal mass causing compression and resulting in thrombus EXAM: CT ABDOMEN AND PELVIS WITH CONTRAST TECHNIQUE: Multidetector CT imaging of the abdomen and pelvis was performed using the standard protocol following bolus administration of intravenous contrast. Oral contrast was also administered. CONTRAST:  126mL OMNIPAQUE IOHEXOL 300 MG/ML  SOLN COMPARISON:  February 10, 2007 FINDINGS: Lower chest: No lung base edema or consolidation evident. There is a small hiatal hernia. Hepatobiliary: There is hepatic steatosis. No focal liver lesions are appreciable. Gallbladder wall is not appreciably thickened. There is no biliary duct dilatation. Pancreas: There is no pancreatic mass or inflammatory focus. Spleen: No splenic lesions are evident. There is a small accessory spleen anterior to the spleen. Adrenals/Urinary Tract: Adrenals bilaterally appear normal. There is a cyst arising from the mid right kidney measuring 9.3 x 7.6 x 7.3 cm. This cyst extends medially to the level  of the inferior vena cava. There is modest narrowing of the inferior vena cava at the site of this cyst, although the cyst does not directly impress upon the inferior vena cava. Elsewhere, there is a cyst in the upper pole of the right kidney measuring 2.3 x 2.2 cm. There is a cyst arising from the medial lower pole left kidney measuring 2.9 x 2.4 cm. There is no hydronephrosis on either side. Note that the large cyst on the right does impress upon the right extrarenal pelvis. There is no evident renal or ureteral calculus on either side. Urinary bladder is midline with wall thickness within normal limits. Stomach/Bowel: There are multiple sigmoid and descending colonic diverticula without diverticulitis. There is moderate stool throughout the colon. There is no appreciable bowel wall or mesenteric thickening. There is no evident bowel obstruction. The terminal ileum  appears normal. There is no evident free air or portal venous air. Vascular/Lymphatic: There is no abdominal aortic aneurysm. There is aortic and iliac artery atherosclerosis. Major venous structures appear patent on this study. Slight narrowing of the inferior vena cava at the site of the large medial right renal cyst noted. Note that there is a circumaortic left renal vein, an anatomic variant. No adenopathy is evident in the abdomen or pelvis. Reproductive: Prostate and seminal vesicles appear normal in size and contour. No pelvic masses are evident. Other: The appendix appears normal. No abscess or ascites is evident in the abdomen or pelvis. There is no abdominal or pelvic mass beyond the renal cysts noted above. Musculoskeletal: No blastic or lytic bone lesions. There are foci of degenerative change in the thoracic spine. There are no intramuscular lesions. No abdominal wall lesions appreciable. IMPRESSION: 1. There is a cyst arising from the medial mid right kidney measuring 9.3 x 7.6 x 7.3 cm. This cyst extends medially to a level immediately  adjacent to the inferior vena cava. There is slight localized narrowing of the inferior vena cava at the site of the cyst without direct impression of the cyst upon the inferior vena cava with supine positioning. There is no obstruction of the inferior vena cava in this area. No other large mass evident in the abdomen or pelvis. There are small renal cysts on each side. 2. There are multiple sigmoid and descending colonic diverticula without diverticulitis. No bowel obstruction. No abscess in the abdomen or pelvis. Appendix appears normal. 3.  Small hiatal hernia. 4. No renal or ureteral calculi. No hydronephrosis. Urinary bladder wall thickness normal. 5.  Aortic Atherosclerosis (ICD10-I70.0). 6.  Hepatic steatosis. Electronically Signed   By: Lowella Grip III M.D.   On: 02/02/2019 12:08   Vas [place] Lower Extremity Venous (dvt)  Result Date: 02/12/2019  Lower Venous Study Indications: F/U right leg DVT.  Risk Factors: DVT Right leg DVT. Comparison Study: 01/23/2019 Performing Technologist: Charlane Ferretti RT (R)(VS)  Examination Guidelines: A complete evaluation includes B-mode imaging, spectral Doppler, color Doppler, and power Doppler as needed of all accessible portions of each vessel. Bilateral testing is considered an integral part of a complete examination. Limited examinations for reoccurring indications may be performed as noted.  +---------+---------------+---------+-----------+----------+--------------+  RIGHT     Compressibility Phasicity Spontaneity Properties Thrombus Aging  +---------+---------------+---------+-----------+----------+--------------+  CFV       Full                                                             +---------+---------------+---------+-----------+----------+--------------+  SFJ       Full                                                             +---------+---------------+---------+-----------+----------+--------------+  FV Prox   Full                                                              +---------+---------------+---------+-----------+----------+--------------+  FV Mid    Full                                                             +---------+---------------+---------+-----------+----------+--------------+  FV Distal Full                                                             +---------+---------------+---------+-----------+----------+--------------+  PFV       Full                                                             +---------+---------------+---------+-----------+----------+--------------+  POP       Full                                                             +---------+---------------+---------+-----------+----------+--------------+  PTV       Full                                                             +---------+---------------+---------+-----------+----------+--------------+  PERO      Full                                                             +---------+---------------+---------+-----------+----------+--------------+  Gastroc   Full                                                             +---------+---------------+---------+-----------+----------+--------------+  GSV       Full                                                             +---------+---------------+---------+-----------+----------+--------------+  SSV       Full                                                             +---------+---------------+---------+-----------+----------+--------------+  Summary: Right: Findings appear improved from previous examination. There is no evidence of deep vein thrombosis in the lower extremity.There is no evidence of superficial venous thrombosis.  *See table(s) above for measurements and observations. Electronically signed by Hortencia Pilar MD on 02/12/2019 at 9:00:27 AM.    Final     ASSESSMENT: Pulmonary embolism, DVT  PLAN:    1. Pulmonary embolism, DVT: Patient appears to have no transient risk factors.  Imaging  results reviewed independently and reported as above with no obvious underlying malignancy.  Patient is undergone thrombectomy with thrombolysis with significant improvement of his symptoms.  Full hypercoagulable work-up is negative.  Continue Eliquis which she will require a minimum of 6 months of treatment.  Patient will return to clinic at the end of April with a video assisted telemedicine visit to discuss whether to discontinue Eliquis, continue treatment for an additional 6 months, or consider lifelong therapy.   2.  Renal cyst: Appreciate urology input.  Cyst does not appear to be compressing the IVC.  Patient has a second opinion with [facility] oncology for further evaluation and possible resection.  If malignant, this would be the likely etiology of his blood clots.  If no malignancy is identified, then will discuss treatment options as above.   3.  Hematuria: Resolved. 4.  Health maintenance: Patient had a normal colonoscopy earlier this week.    I provided 25 minutes of face-to-face video visit time during this encounter, and > 50% was spent counseling as documented under my assessment & plan.    Patient expressed understanding and was in agreement with this plan. He also understands that He can call clinic at any time with any questions, concerns, or complaints.    Lloyd Huger, MD   02/27/2019 4:30 PM

## 2019-02-24 ENCOUNTER — Ambulatory Visit
Admission: RE | Admit: 2019-02-24 | Discharge: 2019-02-24 | Disposition: A | Discharge status: Home / Self Care | Payer: Commercial Managed Care - PPO | Attending: Gastroenterology | Admitting: Gastroenterology

## 2019-02-24 ENCOUNTER — Ambulatory Visit: Payer: Commercial Managed Care - PPO | Admitting: Anesthesiology

## 2019-02-24 ENCOUNTER — Encounter
Admission: RE | Disposition: A | Discharge status: Home / Self Care | Payer: Self-pay | Source: Home / Self Care | Attending: Gastroenterology

## 2019-02-24 ENCOUNTER — Other Ambulatory Visit: Payer: Self-pay

## 2019-02-24 DIAGNOSIS — Z1211 Encounter for screening for malignant neoplasm of colon: Secondary | ICD-10-CM

## 2019-02-24 DIAGNOSIS — K219 Gastro-esophageal reflux disease without esophagitis: Secondary | ICD-10-CM | POA: Insufficient documentation

## 2019-02-24 DIAGNOSIS — Z79899 Other long term (current) drug therapy: Secondary | ICD-10-CM | POA: Diagnosis not present

## 2019-02-24 DIAGNOSIS — Z9189 Other specified personal risk factors, not elsewhere classified: Secondary | ICD-10-CM

## 2019-02-24 DIAGNOSIS — K5730 Diverticulosis of large intestine without perforation or abscess without bleeding: Secondary | ICD-10-CM | POA: Diagnosis not present

## 2019-02-24 DIAGNOSIS — Z7982 Long term (current) use of aspirin: Secondary | ICD-10-CM | POA: Diagnosis not present

## 2019-02-24 DIAGNOSIS — E782 Mixed hyperlipidemia: Secondary | ICD-10-CM | POA: Insufficient documentation

## 2019-02-24 DIAGNOSIS — Z7901 Long term (current) use of anticoagulants: Secondary | ICD-10-CM | POA: Insufficient documentation

## 2019-02-24 DIAGNOSIS — Z86711 Personal history of pulmonary embolism: Secondary | ICD-10-CM | POA: Insufficient documentation

## 2019-02-24 DIAGNOSIS — G4730 Sleep apnea, unspecified: Secondary | ICD-10-CM | POA: Diagnosis not present

## 2019-02-24 HISTORY — PX: COLONOSCOPY WITH PROPOFOL: SHX5780

## 2019-02-24 SURGERY — COLONOSCOPY WITH PROPOFOL
Anesthesia: General

## 2019-02-24 MED ORDER — MIDAZOLAM HCL 2 MG/2ML IJ SOLN
INTRAMUSCULAR | Status: AC
  Filled 2019-02-24: qty 2

## 2019-02-24 MED ORDER — FENTANYL CITRATE (PF) 100 MCG/2ML IJ SOLN
INTRAMUSCULAR | Status: AC
  Filled 2019-02-24: qty 2

## 2019-02-24 MED ORDER — FENTANYL CITRATE (PF) 100 MCG/2ML IJ SOLN
INTRAMUSCULAR | Status: DC | PRN
  Administered 2019-02-24: 50 ug via INTRAVENOUS

## 2019-02-24 MED ORDER — MIDAZOLAM HCL 2 MG/2ML IJ SOLN
INTRAMUSCULAR | Status: DC | PRN
  Administered 2019-02-24: 2 mg via INTRAVENOUS

## 2019-02-24 MED ORDER — SODIUM CHLORIDE 0.9 % IV SOLN
INTRAVENOUS | Status: DC
  Administered 2019-02-24: 11:00:00 via INTRAVENOUS

## 2019-02-24 MED ORDER — PROPOFOL 500 MG/50ML IV EMUL
INTRAVENOUS | Status: AC
  Filled 2019-02-24: qty 50

## 2019-02-24 MED ORDER — PROPOFOL 500 MG/50ML IV EMUL
INTRAVENOUS | Status: DC | PRN
  Administered 2019-02-24: 120 ug/kg/min via INTRAVENOUS

## 2019-02-24 NOTE — Op Note (Signed)
[facility] Patient Name: Nathan Scott Procedure Date: 02/24/2019 11:14 AM MRN: [id number] Account #: [id number] Date of Birth: [birth year] Admit Type: Outpatient Age: 58 Room: [facility] ENDO ROOM 4 Gender: Male Note Status: Finalized Procedure:             Colonoscopy Indications:           Screening for colorectal malignant neoplasm Providers:             Lucilla Lame MD, MD Medicines:             Propofol per Anesthesia Complications:         No immediate complications. Procedure:             Pre-Anesthesia Assessment:                        - Prior to the procedure, a History and Physical was                         performed, and patient medications and allergies were                         reviewed. The patient's tolerance of previous                         anesthesia was also reviewed. The risks and benefits                         of the procedure and the sedation options and risks                         were discussed with the patient. All questions were                         answered, and informed consent was obtained. Prior                         Anticoagulants: The patient has taken no previous                         anticoagulant or antiplatelet agents. ASA Grade                         Assessment: II - A patient with mild systemic disease.                         After reviewing the risks and benefits, the patient                         was deemed in satisfactory condition to undergo the                         procedure.                        After obtaining informed consent, the colonoscope was                         passed under direct vision. Throughout the procedure,  the patient's blood pressure, pulse, and oxygen                         saturations were monitored continuously. The                         Colonoscope was introduced through the anus and                         advanced to the the  cecum, identified by appendiceal                         orifice and ileocecal valve. The colonoscopy was                         performed without difficulty. The patient tolerated                         the procedure well. The quality of the bowel                         preparation was excellent. Findings:      The perianal and digital rectal examinations were normal.      Multiple small-mouthed diverticula were found in the sigmoid colon and       descending colon. Impression:            - Diverticulosis in the sigmoid colon and in the                         descending colon.                        - No specimens collected. Recommendation:        - Discharge patient to home.                        - Resume previous diet.                        - Continue present medications.                        - Repeat colonoscopy in 10 years for screening unless                         any change in family history or lower GI problems. Procedure Code(s):     --- Professional ---                        [phone], Colonoscopy, flexible; diagnostic, including                         collection of specimen(s) by brushing or washing, when                         performed (separate procedure) Diagnosis Code(s):     --- Professional ---                        Z12.11, Encounter for screening for malignant neoplasm  of colon CPT copyright 2019 [employer]. All rights reserved. The codes documented in this report are preliminary and upon coder review may  be revised to meet current compliance requirements. Lucilla Lame MD, MD 02/24/2019 11:42:51 AM This report has been signed electronically. Number of Addenda: 0 Note Initiated On: 02/24/2019 11:14 AM Scope Withdrawal Time: 0 hours 7 minutes 23 seconds  Total Procedure Duration: 0 hours 10 minutes 59 seconds  Estimated Blood Loss:  Estimated blood loss: none.      [facility]

## 2019-02-24 NOTE — Anesthesia Preprocedure Evaluation (Signed)
Anesthesia Evaluation  Patient identified by MRN, date of birth, ID band Patient awake    Reviewed: Allergy & Precautions, H&P , NPO status , Patient's Chart, lab work & pertinent test results, reviewed documented beta blocker date and time   Airway Mallampati: II  TM Distance: >3 FB Neck ROM: full    Dental  (+) Teeth Intact   Pulmonary sleep apnea , PE   Pulmonary exam normal        Cardiovascular negative cardio ROS Normal cardiovascular exam Rhythm:regular Rate:Normal     Neuro/Psych Previous vasovagal from trumpet, o/w neg negative neurological ROS  negative psych ROS   GI/Hepatic negative GI ROS, Neg liver ROS, GERD  Medicated,  Endo/Other  negative endocrine ROS  Renal/GU   negative genitourinary   Musculoskeletal   Abdominal   Peds negative pediatric ROS (+)  Hematology negative hematology ROS (+)   Anesthesia Other Findings Past Medical History: No date: Edema     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 No date: GERD (gastroesophageal reflux disease) No date: Sleep apnea     Comment: no cpap x 8 years Past Surgical History: No date: FRACTURE SURGERY Left     Comment: pinning leg fx BMI    Body Mass Index:  29.03 kg/m   Renal cyst  Reproductive/Obstetrics negative OB ROS                             Anesthesia Physical  Anesthesia Plan  ASA: III  Anesthesia Plan: General   Post-op Pain Management:    Induction:   PONV Risk Score and Plan: Propofol infusion  Airway Management Planned: Nasal Cannula  Additional Equipment:   Intra-op Plan:   Post-operative Plan:   Informed Consent: I have reviewed the patients History and Physical, chart, labs and discussed the procedure including the risks, benefits and alternatives for the proposed anesthesia with the patient or authorized representative who has indicated his/her understanding and acceptance.     Dental Advisory  Given  Plan Discussed with: CRNA  Anesthesia Plan Comments:         Anesthesia Quick Evaluation

## 2019-02-24 NOTE — Transfer of Care (Signed)
Immediate Anesthesia Transfer of Care Note  Patient: Nathan Scott  Procedure(s) Performed: COLONOSCOPY WITH PROPOFOL (N/A )  Patient Location: PACU  Anesthesia Type:General  Level of Consciousness: awake and sedated  Airway & Oxygen Therapy: Patient Spontanous Breathing and Patient connected to nasal cannula oxygen  Post-op Assessment: Report given to RN and Post -op Vital signs reviewed and stable  Post vital signs: Reviewed and stable  Last Vitals:  Vitals Value Taken Time  BP    Temp    Pulse    Resp    SpO2      Last Pain: There were no vitals filed for this visit.       Complications: No apparent anesthesia complications

## 2019-02-24 NOTE — H&P (Signed)
Lucilla Lame, MD [address], [place] [address] Phone: [phone] Fax : [phone]  Primary Care Physician:  Rusty Aus, MD Primary Gastroenterologist:  Dr. Allen Norris  Pre-Procedure History & Physical: HPI:  Nathan Scott is a 58 y.o. male is here for a screening colonoscopy.   Past Medical History:  Diagnosis Date  . 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 GERD (gastroesophageal reflux disease)   . Pulmonary embolus ([place])   . Sleep apnea    no cpap x 8 years    Past Surgical History:  Procedure Laterality Date  . FRACTURE SURGERY Left    pinning leg fx  . LEFT HEART CATH AND CORONARY ANGIOGRAPHY Left 01/12/2019   Procedure: LEFT HEART CATH AND CORONARY ANGIOGRAPHY;  Surgeon: Teodoro Spray, MD;  Location: [facility];  Service: Cardiovascular;  Laterality: Left;  . NASAL SEPTOPLASTY W/ TURBINOPLASTY Bilateral 02/20/2016   Procedure: NASAL SEPTOPLASTY WITH TURBINATE REDUCTION;  Surgeon: Margaretha Sheffield, MD;  Location: [facility];  Service: ENT;  Laterality: Bilateral;  . PULMONARY THROMBECTOMY Bilateral 01/22/2019   Procedure: PULMONARY THROMBECTOMY;  Surgeon: Katha Cabal, MD;  Location: [facility];  Service: Cardiovascular;  Laterality: Bilateral;    Prior to Admission medications   Medication Sig Start Date End Date Taking? Authorizing Provider  ALPRAZolam Duanne Moron) 0.5 MG tablet Take 0.5 mg by mouth at bedtime as needed for sleep.    Yes [provider]  apixaban (ELIQUIS) 5 MG TABS tablet Take 1 tablet (5 mg total) by mouth 2 (two) times daily. 02/02/19  Yes Schnier, Dolores Lory, MD  aspirin EC 81 MG tablet Take 81 mg by mouth daily.   Yes [provider]  cholecalciferol (VITAMIN D) 25 MCG (1000 UT) tablet Take 1,000 Units by mouth daily.   Yes [provider]  cyclobenzaprine (FLEXERIL) 10 MG tablet Take 10 mg by mouth daily as needed for muscle spasms.    Yes [provider]  hydrochlorothiazide  (HYDRODIURIL) 25 MG tablet Take 25 mg by mouth daily. 07/04/15  Yes [provider]  omeprazole (PRILOSEC) 40 MG capsule Take 40 mg by mouth daily before breakfast. 11/12/15  Yes [provider]  rOPINIRole (REQUIP XL) 2 MG 24 hr tablet Take 2 mg by mouth at bedtime. 01/22/19 01/22/20 Yes [provider]  sertraline (ZOLOFT) 50 MG tablet Take 50 mg by mouth at bedtime.   Yes [provider]  simvastatin (ZOCOR) 20 MG tablet Take 20 mg by mouth at bedtime.   Yes [provider]  APPLE CIDER VINEGAR PO Take 3 capsules by mouth daily.     [provider]  Eliquis DVT/PE Starter Pack (ELIQUIS STARTER PACK) 5 MG TABS Take as directed on package: start with two-5mg  tablets twice daily for 7 days. On day 8, switch to one-5mg  tablet twice daily. 01/23/19   Sudini, Srikar, MD  Flaxseed, Linseed, (FLAXSEED OIL) 1200 MG CAPS Take 2,400 mg by mouth daily.    [provider]  Magnesium Oxide 500 MG TABS Take 500 mg by mouth daily.    [provider]  Omega-3 Fatty Acids (FISH OIL) 1000 MG CAPS Take 2,000 mg by mouth daily.    [provider]  pyridOXINE (VITAMIN B-6) 100 MG tablet Take 100 mg by mouth daily.    [provider]  triamcinolone (NASACORT ALLERGY 24HR) 55 MCG/ACT AERO nasal inhaler Place 2 sprays into the nose daily.    [provider]  triamcinolone cream (KENALOG) 0.1 % Apply 1 application topically 2 (two) times daily as needed (for itching legs.).    [provider]    Allergies as of 02/05/2019  . (No Known Allergies)    Family History  Problem Relation Age of Onset  . Heart attack Father   . Clotting disorder Paternal Grandmother     Social History   Socioeconomic History  . Marital status: Married    Spouse name: Not on file  . Number of children: Not on file  . Years of education: Not on file  . Highest education level: Not on file  Occupational History  . Not on file   Social Needs  . Financial resource strain: Not on file  . Food insecurity    Worry: Not on file    Inability: Not on file  . Transportation needs    Medical: Not on file    Non-medical: Not on file  Tobacco Use  . Smoking status: Never Smoker  . Smokeless tobacco: Never Used  Substance and Sexual Activity  . Alcohol use: No  . Drug use: No  . Sexual activity: Not on file  Lifestyle  . Physical activity    Days per week: Not on file    Minutes per session: Not on file  . Stress: Not on file  Relationships  . Social Herbalist on phone: Not on file    Gets together: Not on file    Attends religious service: Not on file    Active member of club or organization: Not on file    Attends meetings of clubs or organizations: Not on file    Relationship status: Not on file  . Intimate partner violence    Fear of current or ex partner: Not on file    Emotionally abused: Not on file    Physically abused: Not on file    Forced sexual activity: Not on file  Other Topics Concern  . Not on file  Social History Narrative  . Not on file    Review of Systems: See HPI, otherwise negative ROS  Physical Exam: BP 132/87   Pulse 66   Temp (!) 97.5 F (36.4 C)   Resp 18   Ht 6\' 1"  (1.854 m)   Wt 96.6 kg   SpO2 100%   BMI 28.10 kg/m  General:   Alert,  pleasant and cooperative in NAD Head:  Normocephalic and atraumatic. Neck:  Supple; no masses or thyromegaly. Lungs:  Clear throughout to auscultation.    Heart:  Regular rate and rhythm. Abdomen:  Soft, nontender and nondistended. Normal bowel sounds, without guarding, and without rebound.   Neurologic:  Alert and  oriented x4;  grossly normal neurologically.  Impression/Plan: Nathan Scott is now here to undergo a screening colonoscopy.  Risks, benefits, and alternatives regarding colonoscopy have been reviewed with the patient.  Questions have been answered.  All parties agreeable.

## 2019-02-24 NOTE — Anesthesia Post-op Follow-up Note (Signed)
Anesthesia QCDR form completed.        

## 2019-02-24 NOTE — Anesthesia Postprocedure Evaluation (Signed)
Anesthesia Post Note  Patient: Pharoah Slavin  Procedure(s) Performed: COLONOSCOPY WITH PROPOFOL (N/A )  Patient location during evaluation: Endoscopy Anesthesia Type: General Level of consciousness: awake and alert and oriented Pain management: pain level controlled Vital Signs Assessment: post-procedure vital signs reviewed and stable Respiratory status: spontaneous breathing Cardiovascular status: blood pressure returned to baseline Anesthetic complications: no     Last Vitals:  Vitals:   02/24/19 1145 02/24/19 1155  BP: 104/75 111/74  Pulse:    Resp:    Temp: (!) 36.3 C   SpO2:      Last Pain:  Vitals:   02/24/19 1205  TempSrc:   PainSc: 0-No pain                 Mohamad Bruso

## 2019-02-25 ENCOUNTER — Encounter: Payer: Self-pay | Admitting: Gastroenterology

## 2019-02-27 ENCOUNTER — Inpatient Hospital Stay: Payer: Commercial Managed Care - PPO | Attending: Oncology | Admitting: Oncology

## 2019-02-27 DIAGNOSIS — I2602 Saddle embolus of pulmonary artery with acute cor pulmonale: Secondary | ICD-10-CM

## 2019-03-03 ENCOUNTER — Ambulatory Visit: Payer: Self-pay | Admitting: Urology

## 2019-03-04 ENCOUNTER — Telehealth: Payer: Self-pay | Admitting: Urology

## 2019-03-04 NOTE — Telephone Encounter (Signed)
Pt is in Urinary Retention and is going to see the PA, Rob Tumey at [facility]. His wife just wanted to let us know what was going on. I offered for her to make him an appt here, but she said she will just call back after the appt at [facility] @4pm  today.

## 2019-03-09 ENCOUNTER — Ambulatory Visit (INDEPENDENT_AMBULATORY_CARE_PROVIDER_SITE_OTHER): Payer: Commercial Managed Care - PPO | Admitting: Vascular Surgery

## 2019-03-23 ENCOUNTER — Ambulatory Visit (INDEPENDENT_AMBULATORY_CARE_PROVIDER_SITE_OTHER): Payer: Commercial Managed Care - PPO | Admitting: Vascular Surgery

## 2019-03-23 ENCOUNTER — Encounter (INDEPENDENT_AMBULATORY_CARE_PROVIDER_SITE_OTHER): Payer: Self-pay | Admitting: Vascular Surgery

## 2019-03-23 ENCOUNTER — Other Ambulatory Visit: Payer: Self-pay

## 2019-03-23 VITALS — BP 118/79 | HR 77 | Resp 16 | Ht 73.0 in | Wt 217.0 lb

## 2019-03-23 DIAGNOSIS — I2602 Saddle embolus of pulmonary artery with acute cor pulmonale: Secondary | ICD-10-CM | POA: Diagnosis not present

## 2019-03-23 DIAGNOSIS — K219 Gastro-esophageal reflux disease without esophagitis: Secondary | ICD-10-CM

## 2019-03-23 DIAGNOSIS — E782 Mixed hyperlipidemia: Secondary | ICD-10-CM | POA: Diagnosis not present

## 2019-03-23 DIAGNOSIS — N281 Cyst of kidney, acquired: Secondary | ICD-10-CM | POA: Diagnosis not present

## 2019-03-23 NOTE — Progress Notes (Signed)
MRN : [id number]  Nathan Scott is a 58 y.o. (12/05/1960) male who presents with chief complaint of  Chief Complaint  Patient presents with   Follow-up    1 mo  no studies  .  History of Present Illness:  The patient presents to the office for follow-up evaluation status post pulmonary thrombectomy.   Thrombectomy was performed on 01/22/2019:  Mechanical thrombectomywith the penumbra CAT 12 right upper, middle and lower lobepulmonary arteriesand left lower lobe pulmonary artery.   The presenting symptoms were pleuritic chest pain and profound SOB.    In fact, the patient is now exercising 30 to 45 minutes on an elliptical without difficulty.  Previous CT angiogram demonstrated a large volume of emboli with significant right heart strain.  The patient notes resolution of his shortness of breath.  He denies pleuritic chest pain.  He denies persisting cough or hemoptysis.  The patient denies significant leg pain and swelling dependency.  The patient notes minimal edema in the morning.    No blood per rectum or blood in any sputum.  No excessive bruising per the patient.   He has sought a second opinion regarding his renal cyst particularly with respect to the large right renal cyst.  CT scan of the abdomen and pelvis with him in a left side down position was obtained and this shows mild to moderate compression of the cava by the cyst.  (Previous CT scan of the abdomen pelvis in a supine position shows very minimal if any compression of the cava by the large renal cyst) given this finding it has been recommended that he undergo aspiration of the cyst which would also allow for histologic examination of the fluid.  I have also reviewed the notes with Dr. Grayland Ormond his hypercoagulable panel is negative to date.    Current Meds  Medication Sig   ALPRAZolam (XANAX) 0.5 MG tablet Take 0.5 mg by mouth at bedtime as needed for sleep.    apixaban (ELIQUIS) 5 MG TABS tablet Take 1  tablet (5 mg total) by mouth 2 (two) times daily.   APPLE CIDER VINEGAR PO Take 3 capsules by mouth daily.    aspirin EC 81 MG tablet Take 81 mg by mouth daily.   cholecalciferol (VITAMIN D) 25 MCG (1000 UT) tablet Take 1,000 Units by mouth daily.   cyclobenzaprine (FLEXERIL) 10 MG tablet Take 10 mg by mouth daily as needed for muscle spasms.    Flaxseed, Linseed, (FLAXSEED OIL) 1200 MG CAPS Take 2,400 mg by mouth daily.   glucosamine-chondroitin 500-400 MG tablet Take 1 tablet by mouth 3 (three) times daily.   hydrochlorothiazide (HYDRODIURIL) 25 MG tablet Take 25 mg by mouth daily.   Magnesium Oxide 500 MG TABS Take 500 mg by mouth daily.   Omega-3 Fatty Acids (FISH OIL) 1000 MG CAPS Take 2,000 mg by mouth daily.   pyridOXINE (VITAMIN B-6) 100 MG tablet Take 100 mg by mouth daily.   rOPINIRole (REQUIP XL) 2 MG 24 hr tablet Take 2 mg by mouth at bedtime.   sertraline (ZOLOFT) 50 MG tablet Take 50 mg by mouth at bedtime.   simvastatin (ZOCOR) 20 MG tablet Take 20 mg by mouth at bedtime.   triamcinolone (NASACORT ALLERGY 24HR) 55 MCG/ACT AERO nasal inhaler Place 2 sprays into the nose daily.   triamcinolone cream (KENALOG) 0.1 % Apply 1 application topically 2 (two) times daily as needed (for itching legs.).    Past Medical History:  Diagnosis Date  Edema    dependent in feet   GERD (gastroesophageal reflux disease)    Pulmonary embolus ([facility])    Sleep apnea    no cpap x 8 years    Past Surgical History:  Procedure Laterality Date   COLONOSCOPY WITH PROPOFOL N/A 02/24/2019   Procedure: COLONOSCOPY WITH PROPOFOL;  Surgeon: Lucilla Lame, MD;  Location: [facility];  Service: Endoscopy;  Laterality: N/A;   FRACTURE SURGERY Left    pinning leg fx   LEFT HEART CATH AND CORONARY ANGIOGRAPHY Left 01/12/2019   Procedure: LEFT HEART CATH AND CORONARY ANGIOGRAPHY;  Surgeon: Teodoro Spray, MD;  Location: [facility];  Service: Cardiovascular;  Laterality:  Left;   NASAL SEPTOPLASTY W/ TURBINOPLASTY Bilateral 02/20/2016   Procedure: NASAL SEPTOPLASTY WITH TURBINATE REDUCTION;  Surgeon: Margaretha Sheffield, MD;  Location: [facility];  Service: ENT;  Laterality: Bilateral;   PULMONARY THROMBECTOMY Bilateral 01/22/2019   Procedure: PULMONARY THROMBECTOMY;  Surgeon: Katha Cabal, MD;  Location: [facility];  Service: Cardiovascular;  Laterality: Bilateral;    Social History Social History   Tobacco Use   Smoking status: Never Smoker   Smokeless tobacco: Never Used  Substance Use Topics   Alcohol use: No   Drug use: No    Family History Family History  Problem Relation Age of Onset   Heart attack Father    Clotting disorder Paternal Grandmother     No Known Allergies   REVIEW OF SYSTEMS (Negative unless checked)  Constitutional: [] Weight loss  [] Fever  [] Chills Cardiac: [] Chest pain   [] Chest pressure   [] Palpitations   [] Shortness of breath when laying flat   [] Shortness of breath with exertion. Vascular:  [] Pain in legs with walking   [] Pain in legs at rest  [x] History of PE   [] Phlebitis   [] Swelling in legs   [] Varicose veins   [] Non-healing ulcers Pulmonary:   [] Uses home oxygen   [] Productive cough   [] Hemoptysis   [] Wheeze  [] COPD   [] Asthma Neurologic:  [] Dizziness   [] Seizures   [] History of stroke   [] History of TIA  [] Aphasia   [] Vissual changes   [] Weakness or numbness in arm   [] Weakness or numbness in leg Musculoskeletal:   [] Joint swelling   [] Joint pain   [] Low back pain Hematologic:  [] Easy bruising  [] Easy bleeding   [] Hypercoagulable state   [] Anemic Gastrointestinal:  [] Diarrhea   [] Vomiting  [x] Gastroesophageal reflux/heartburn   [] Difficulty swallowing. Genitourinary:  [] Chronic kidney disease   [] Difficult urination  [] Frequent urination   [] Blood in urine Skin:  [] Rashes   [] Ulcers  Psychological:  [] History of anxiety   []  History of major depression.  Physical Examination  Vitals:   03/23/19  1542  BP: 118/79  Pulse: 77  Resp: 16  Weight: 217 lb (98.4 kg)  Height: 6\' 1"  (1.854 m)   Body mass index is 28.63 kg/m. Gen: WD/WN, NAD Head: [place]/AT, No temporalis wasting.  Ear/Nose/Throat: Hearing grossly intact, nares w/o erythema or drainage Eyes: PER, EOMI, sclera nonicteric.  Neck: Supple, no large masses.   Pulmonary:  Good air movement, no audible wheezing bilaterally, no use of accessory muscles.  Cardiac: RRR, no JVD Vascular:  Vessel Right Left  Radial Palpable Palpable  Gastrointestinal: Non-distended. No guarding/no peritoneal signs.  Musculoskeletal: M/S 5/5 throughout.  No deformity or atrophy.  Neurologic: CN 2-12 intact. Symmetrical.  Speech is fluent. Motor exam as listed above. Psychiatric: Judgment intact, Mood & affect appropriate for pt's clinical situation. Dermatologic: No  rashes or ulcers noted.  No changes consistent with cellulitis. Lymph : No lichenification or skin changes of chronic lymphedema.  CBC Lab Results  Component Value Date   WBC 6.7 01/23/2019   HGB 11.6 (L) 01/23/2019   HCT 34.0 (L) 01/23/2019   MCV 87.2 01/23/2019   PLT 126 (L) 01/23/2019    BMET    Component Value Date/Time   NA 136 01/23/2019 0322   NA 140 03/25/2013 1449   K 3.9 01/23/2019 0322   K 3.8 03/25/2013 1449   CL 106 01/23/2019 0322   CL 107 03/25/2013 1449   CO2 25 01/23/2019 0322   CO2 28 03/25/2013 1449   GLUCOSE 114 (H) 01/23/2019 0322   GLUCOSE 84 03/25/2013 1449   BUN 25 (H) 01/23/2019 0322   BUN 18 03/25/2013 1449   CREATININE 0.94 01/23/2019 0322   CREATININE 0.77 03/25/2013 1449   CALCIUM 8.0 (L) 01/23/2019 0322   CALCIUM 9.0 03/25/2013 1449   GFRNONAA >60 01/23/2019 0322   GFRNONAA >60 03/25/2013 1449   GFRAA >60 01/23/2019 0322   GFRAA >60 03/25/2013 1449   CrCl cannot be calculated (Patient's most recent lab result is older than the maximum 21 days allowed.).  COAG Lab Results  Component Value Date   INR 1.1 01/22/2019     Radiology No results found.   Assessment/Plan 1. Acute saddle pulmonary embolism with acute cor pulmonale (HCC) The patient has done extremely well from his pulmonary thrombectomy.  His exercise tolerance is excellent.  At the present time he is taking his Eliquis as directed.  His hypercoagulable panel is negative to date.  He does have a second opinion with [facility] around May 2021 and I have encouraged him to keep this appointment.  In the meantime he will continue taking his Eliquis I have recommended he take it for 1 year total given his pulmonary emboli.  Should the [facility] evaluation also be negative for hypercoagulable state I have told him that we would likely stop his Eliquis at that time.   A total of 30 minutes was spent with this patient and greater than 50% was spent in counseling and coordination of care with the patient.  Discussion included the treatment options for vascular disease including indications for surgery and intervention.  Also discussed is the appropriate timing of treatment.  In addition medical therapy was discussed.  2. Renal cyst There is question regarding aspiration of his renal cyst which was tentatively scheduled for later this month.  Given that this is not an emergency there is no profound impact on his venous return from the cyst I have encouraged him to wait until early February at which time he could be off his Eliquis for several days without any significant concerns.  Further treatment recommendations per urology.  3. Gastroesophageal reflux disease without esophagitis Continue PPI as already ordered, this medication has been reviewed and there are no changes at this time.  Avoidence of caffeine and alcohol  Moderate elevation of the head of the bed   4. Hyperlipemia, mixed Continue statin as ordered and reviewed, no changes at this time     Hortencia Pilar, MD  03/23/2019 4:00 PM

## 2019-03-24 ENCOUNTER — Encounter (INDEPENDENT_AMBULATORY_CARE_PROVIDER_SITE_OTHER): Payer: Self-pay | Admitting: Vascular Surgery

## 2019-05-20 ENCOUNTER — Other Ambulatory Visit (INDEPENDENT_AMBULATORY_CARE_PROVIDER_SITE_OTHER): Payer: Self-pay | Admitting: Vascular Surgery

## 2019-05-20 DIAGNOSIS — I83813 Varicose veins of bilateral lower extremities with pain: Secondary | ICD-10-CM

## 2019-05-21 ENCOUNTER — Encounter (INDEPENDENT_AMBULATORY_CARE_PROVIDER_SITE_OTHER): Payer: Self-pay | Admitting: Nurse Practitioner

## 2019-05-21 ENCOUNTER — Other Ambulatory Visit: Payer: Self-pay

## 2019-05-21 ENCOUNTER — Ambulatory Visit (INDEPENDENT_AMBULATORY_CARE_PROVIDER_SITE_OTHER): Payer: Commercial Managed Care - PPO

## 2019-05-21 ENCOUNTER — Ambulatory Visit (INDEPENDENT_AMBULATORY_CARE_PROVIDER_SITE_OTHER): Payer: Commercial Managed Care - PPO | Admitting: Nurse Practitioner

## 2019-05-21 VITALS — BP 134/87 | HR 80 | Resp 16 | Wt 210.0 lb

## 2019-05-21 DIAGNOSIS — R209 Unspecified disturbances of skin sensation: Secondary | ICD-10-CM | POA: Diagnosis not present

## 2019-05-21 DIAGNOSIS — I872 Venous insufficiency (chronic) (peripheral): Secondary | ICD-10-CM | POA: Diagnosis not present

## 2019-05-21 DIAGNOSIS — I2602 Saddle embolus of pulmonary artery with acute cor pulmonale: Secondary | ICD-10-CM | POA: Diagnosis not present

## 2019-05-21 DIAGNOSIS — I83813 Varicose veins of bilateral lower extremities with pain: Secondary | ICD-10-CM

## 2019-05-25 ENCOUNTER — Encounter (INDEPENDENT_AMBULATORY_CARE_PROVIDER_SITE_OTHER): Payer: Self-pay | Admitting: Nurse Practitioner

## 2019-05-25 NOTE — Progress Notes (Signed)
SUBJECTIVE:  Patient ID: Nathan Scott, male    DOB: 12/10/1960, 59 y.o.   MRN: [id number] Chief Complaint  Patient presents with  . Follow-up    varicose veins    HPI  Nathan Scott is a 59 y.o. male that presents today as referral from Dr. Sabra Heck in regards to varicose veins.  The patient previously had a right lower extremity DVT which subsequently resulted in a saddle pulmonary embolism.  At this time the patient has recovered fairly well and that he is able to undergo semistrenuous exercise without difficulty.  The patient is still on anticoagulation without issue.  The patient is also concerned about his bilateral lower extremities feeling cold especially during the late evening.  He states that despite socks or extra blankets his feet just continue to feel cold all the time.  There is concerned that it could be more related to circulation.  Today noninvasive studies show no evidence of DVT or superficial venous thrombosis bilaterally.  The right lower extremity has evidence of reflux in the great saphenous vein at the knee.  No evidence of deep venous reflux and no evidence of reflux in the right short saphenous vein.  The left lower extremity has no evidence of reflux in the short saphenous vein with some mild reflux noted in the left common femoral vein.  Past Medical History:  Diagnosis Date  . 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 GERD (gastroesophageal reflux disease)   . Pulmonary embolus ([place])   . Sleep apnea    no cpap x 8 years    Past Surgical History:  Procedure Laterality Date  . COLONOSCOPY WITH PROPOFOL N/A 02/24/2019   Procedure: COLONOSCOPY WITH PROPOFOL;  Surgeon: Lucilla Lame, MD;  Location: [facility] ENDOSCOPY;  Service: Endoscopy;  Laterality: N/A;  . FRACTURE SURGERY Left    pinning leg fx  . LEFT HEART CATH AND CORONARY ANGIOGRAPHY Left 01/12/2019   Procedure: LEFT HEART CATH AND CORONARY ANGIOGRAPHY;  Surgeon: Teodoro Spray, MD;  Location: [place] [facility];   Service: Cardiovascular;  Laterality: Left;  . NASAL SEPTOPLASTY W/ TURBINOPLASTY Bilateral 02/20/2016   Procedure: NASAL SEPTOPLASTY WITH TURBINATE REDUCTION;  Surgeon: Margaretha Sheffield, MD;  Location: [facility] ORS;  Service: ENT;  Laterality: Bilateral;  . PULMONARY THROMBECTOMY Bilateral 01/22/2019   Procedure: PULMONARY THROMBECTOMY;  Surgeon: Katha Cabal, MD;  Location: [place] [facility];  Service: Cardiovascular;  Laterality: Bilateral;    Social History   Socioeconomic History  . Marital status: Married    Spouse name: Not on file  . Number of children: Not on file  . Years of education: Not on file  . Highest education level: Not on file  Occupational History  . Not on file  Tobacco Use  . Smoking status: Never Smoker  . Smokeless tobacco: Never Used  Substance and Sexual Activity  . Alcohol use: No  . Drug use: No  . Sexual activity: Not on file  Other Topics Concern  . Not on file  Social History Narrative  . Not on file   Social Determinants of Health   Financial Resource Strain:   . Difficulty of Paying Living Expenses: Not on file  Food Insecurity:   . Worried About Charity fundraiser in the Last Year: Not on file  . Ran Out of Food in the Last Year: Not on file  Transportation Needs:   . Lack of Transportation (Medical): Not on file  . Lack of Transportation (Non-Medical):  Not on file  Physical Activity:   . Days of Exercise per Week: Not on file  . Minutes of Exercise per Session: Not on file  Stress:   . Feeling of Stress : Not on file  Social Connections:   . Frequency of Communication with Friends and Family: Not on file  . Frequency of Social Gatherings with Friends and Family: Not on file  . Attends Religious Services: Not on file  . Active Member of Clubs or Organizations: Not on file  . Attends Archivist Meetings: Not on file  . Marital Status: Not on file  Intimate Partner Violence:   . Fear of Current or Ex-Partner: Not on file    . Emotionally Abused: Not on file  . Physically Abused: Not on file  . Sexually Abused: Not on file    Family History  Problem Relation Age of Onset  . Heart attack Father   . Clotting disorder Paternal Grandmother     No Known Allergies   Review of Systems   Review of Systems: Negative Unless Checked Constitutional: [] Weight loss  [] Fever  [] Chills Cardiac: [] Chest pain   []  Atrial Fibrillation  [] Palpitations   [] Shortness of breath when laying flat   [] Shortness of breath with exertion. [] Shortness of breath at rest Vascular:  [] Pain in legs with walking   [] Pain in legs with standing [] Pain in legs when laying flat   [] Claudication    [] Pain in feet when laying flat    [] History of DVT   [] Phlebitis   [x] Swelling in legs   [x] Varicose veins   [] Non-healing ulcers Pulmonary:   [] Uses home oxygen   [] Productive cough   [] Hemoptysis   [] Wheeze  [] COPD   [] Asthma Neurologic:  [] Dizziness   [] Seizures  [] Blackouts [] History of stroke   [] History of TIA  [] Aphasia   [] Temporary Blindness   [] Weakness or numbness in arm   [] Weakness or numbness in leg Musculoskeletal:   [] Joint swelling   [] Joint pain   [] Low back pain  []  History of Knee Replacement [] Arthritis [] back Surgeries  []  Spinal Stenosis    Hematologic:  [] Easy bruising  [] Easy bleeding   [] Hypercoagulable state   [] Anemic Gastrointestinal:  [] Diarrhea   [] Vomiting  [x] Gastroesophageal reflux/heartburn   [] Difficulty swallowing. [] Abdominal pain Genitourinary:  [] Chronic kidney disease   [] Difficult urination  [] Anuric   [] Blood in urine [] Frequent urination  [] Burning with urination   [] Hematuria Skin:  [] Rashes   [] Ulcers [] Wounds Psychological:  [] History of anxiety   []  History of major depression  []  Memory Difficulties      OBJECTIVE:   Physical Exam  BP 134/87 (BP Location: Right Arm)   Pulse 80   Resp 16   Wt 210 lb (95.3 kg)   BMI 27.71 kg/m   Gen: WD/WN, NAD Head: [place]/AT, No temporalis wasting.   Ear/Nose/Throat: Hearing grossly intact, nares w/o erythema or drainage Eyes: PER, EOMI, sclera nonicteric.  Neck: Supple, no masses.  No JVD.  Pulmonary:  Good air movement, no use of accessory muscles.  Cardiac: RRR Vascular:  Minimal edema bilaterally Vessel Right Left  Radial Palpable Palpable  Dorsalis Pedis Palpable  trace palpable  Posterior Tibial Palpable Palpable   Gastrointestinal: soft, non-distended. No guarding/no peritoneal signs.  Musculoskeletal: M/S 5/5 throughout.  No deformity or atrophy.  Neurologic: Pain and light touch intact in extremities.  Symmetrical.  Speech is fluent. Motor exam as listed above. Psychiatric: Judgment intact, Mood & affect appropriate for pt's clinical situation.  ASSESSMENT AND PLAN:  1. Bilateral cold feet The patient does endorse having some coolness of feet and he does have slightly dampened pulses in his left lower extremity.  We will perform noninvasive studies to rule out any possible arterial causes of these cold extremities.  We also discussed the patient that it could possibly be due to him being on anticoagulation and this has a tendency to make people cold at times.  Patient will follow up at his convenience noninvasive studies. - VAS [place] ABI WITH/WO TBI; Future  2. Acute saddle pulmonary embolism with acute cor pulmonale [facility]) Patient will continue with anticoagulation as managed by his oncologist and internal medicine doctor.  Today patient had no evidence of DVT.  At this point plan is to continue with anticoagulation for at least 1 full year possibly longer.  3. Chronic venous insufficiency Based on the noninvasive studies today the patient does have evidence of chronic venous insufficiency.  We discussed invasive versus conservative measures for treating his chronic venous insufficiency.  Based on his symptoms, it was felt that conservative therapy was the best measure for now.  Patient will continue to wear medical  grade 1 compression stockings on a daily basis.  He continue with exercise in addition to elevation of his lower extremities when he is not being active.  Patient will contact our office for follow-up if conservative therapies are no longer effective.    Current Outpatient Medications on File Prior to Visit  Medication Sig Dispense Refill  . ALPRAZolam (XANAX) 0.5 MG tablet Take 0.5 mg by mouth at bedtime as needed for sleep.     Marland Kitchen apixaban (ELIQUIS) 5 MG TABS tablet Take 1 tablet (5 mg total) by mouth 2 (two) times daily. 60 tablet 11  . APPLE CIDER VINEGAR PO Take 3 capsules by mouth daily.     Marland Kitchen aspirin EC 81 MG tablet Take 81 mg by mouth daily.    . cholecalciferol (VITAMIN D) 25 MCG (1000 UT) tablet Take 1,000 Units by mouth daily.    . Chromium Picolinate 800 MCG TABS Take by mouth.    . cyclobenzaprine (FLEXERIL) 10 MG tablet Take 10 mg by mouth daily as needed for muscle spasms.     . Flaxseed, Linseed, (FLAXSEED OIL) 1200 MG CAPS Take 2,400 mg by mouth daily.    Marland Kitchen glucosamine-chondroitin 500-400 MG tablet Take 1 tablet by mouth 3 (three) times daily.    . hydrochlorothiazide (HYDRODIURIL) 25 MG tablet Take 25 mg by mouth daily.    . Magnesium Oxide 500 MG TABS Take 500 mg by mouth daily.    . Omega-3 Fatty Acids (FISH OIL) 1000 MG CAPS Take 2,000 mg by mouth daily.    Marland Kitchen omeprazole (PRILOSEC) 40 MG capsule Take 40 mg by mouth daily before breakfast.    . pyridOXINE (VITAMIN B-6) 100 MG tablet Take 100 mg by mouth daily.    Marland Kitchen rOPINIRole (REQUIP XL) 2 MG 24 hr tablet Take 2 mg by mouth at bedtime.    . sertraline (ZOLOFT) 50 MG tablet Take 50 mg by mouth at bedtime.    . simvastatin (ZOCOR) 20 MG tablet Take 20 mg by mouth at bedtime.    . triamcinolone (NASACORT ALLERGY 24HR) 55 MCG/ACT AERO nasal inhaler Place 2 sprays into the nose daily.    Marland Kitchen triamcinolone cream (KENALOG) 0.1 % Apply 1 application topically 2 (two) times daily as needed (for itching legs.).    Marland Kitchen Turmeric (QC  TUMERIC COMPLEX PO) Take  1,000 mg by mouth.    . Eliquis DVT/PE Starter Pack (ELIQUIS STARTER PACK) 5 MG TABS Take as directed on package: start with two-5mg  tablets twice daily for 7 days. On day 8, switch to one-5mg  tablet twice daily. (Patient not taking: Reported on 03/23/2019) 1 each 0   No current facility-administered medications on file prior to visit.    There are no Patient Instructions on file for this visit. No follow-ups on file.   Kris Hartmann, NP  This note was completed with Sales executive.  Any errors are purely unintentional.

## 2019-06-04 ENCOUNTER — Ambulatory Visit (INDEPENDENT_AMBULATORY_CARE_PROVIDER_SITE_OTHER): Payer: Commercial Managed Care - PPO | Admitting: Nurse Practitioner

## 2019-06-04 ENCOUNTER — Encounter (INDEPENDENT_AMBULATORY_CARE_PROVIDER_SITE_OTHER): Payer: Commercial Managed Care - PPO

## 2019-06-22 ENCOUNTER — Encounter (INDEPENDENT_AMBULATORY_CARE_PROVIDER_SITE_OTHER): Payer: Self-pay | Admitting: Nurse Practitioner

## 2019-06-22 ENCOUNTER — Other Ambulatory Visit: Payer: Self-pay

## 2019-06-22 ENCOUNTER — Ambulatory Visit (INDEPENDENT_AMBULATORY_CARE_PROVIDER_SITE_OTHER): Payer: Commercial Managed Care - PPO

## 2019-06-22 ENCOUNTER — Ambulatory Visit (INDEPENDENT_AMBULATORY_CARE_PROVIDER_SITE_OTHER): Payer: Commercial Managed Care - PPO | Admitting: Nurse Practitioner

## 2019-06-22 VITALS — BP 122/79 | HR 71 | Resp 12 | Ht 74.0 in | Wt 216.0 lb

## 2019-06-22 DIAGNOSIS — R209 Unspecified disturbances of skin sensation: Secondary | ICD-10-CM | POA: Diagnosis not present

## 2019-06-22 DIAGNOSIS — I2602 Saddle embolus of pulmonary artery with acute cor pulmonale: Secondary | ICD-10-CM | POA: Diagnosis not present

## 2019-06-22 NOTE — Progress Notes (Signed)
SUBJECTIVE:  Patient ID: Nathan Scott, male    DOB: [birth year], 59 y.o.   MRN: [id number] Chief Complaint  Patient presents with  . Follow-up    U/S Follow up    HPI  Nathan Scott is a 59 y.o. male returns today for follow-up studies regarding coldness of his bilateral feet.  The patient had a saddle pulmonary embolism sometime in October.  At this time he is recovered fairly well and is able to undergo semistrenuous exercise without difficulty.  Patient has been on Eliquis without difficulty as well.  At the patient's last visit he was concerned due to the fact that his bilateral lower extremities were cold especially during the late evening.  The patient states that he has been recently wearing medical grade 1 compression stockings and that actually has helped the feeling of his cold sensation.  However he would not say that it is completely gone.  Today noninvasive studies revealed that the right ABI is 1.25 and the left is 1.24.  The patient has strong triphasic tibial artery waveforms with strong toe waveforms bilaterally.  Past Medical History:  Diagnosis Date  . 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 GERD (gastroesophageal reflux disease)   . Pulmonary embolus ([place])   . Sleep apnea    no cpap x 8 years    Past Surgical History:  Procedure Laterality Date  . COLONOSCOPY WITH PROPOFOL N/A 02/24/2019   Procedure: COLONOSCOPY WITH PROPOFOL;  Surgeon: Lucilla Lame, MD;  Location: [facility] ENDOSCOPY;  Service: Endoscopy;  Laterality: N/A;  . FRACTURE SURGERY Left    pinning leg fx  . LEFT HEART CATH AND CORONARY ANGIOGRAPHY Left 01/12/2019   Procedure: LEFT HEART CATH AND CORONARY ANGIOGRAPHY;  Surgeon: Teodoro Spray, MD;  Location: [place] [facility];  Service: Cardiovascular;  Laterality: Left;  . NASAL SEPTOPLASTY W/ TURBINOPLASTY Bilateral 02/20/2016   Procedure: NASAL SEPTOPLASTY WITH TURBINATE REDUCTION;  Surgeon: Margaretha Sheffield, MD;  Location: [facility] ORS;  Service: ENT;  Laterality:  Bilateral;  . PULMONARY THROMBECTOMY Bilateral 01/22/2019   Procedure: PULMONARY THROMBECTOMY;  Surgeon: Katha Cabal, MD;  Location: [place] [facility];  Service: Cardiovascular;  Laterality: Bilateral;    Social History   Socioeconomic History  . Marital status: Married    Spouse name: Not on file  . Number of children: Not on file  . Years of education: Not on file  . Highest education level: Not on file  Occupational History  . Not on file  Tobacco Use  . Smoking status: Never Smoker  . Smokeless tobacco: Never Used  Substance and Sexual Activity  . Alcohol use: No  . Drug use: No  . Sexual activity: Not on file  Other Topics Concern  . Not on file  Social History Narrative  . Not on file   Social Determinants of Health   Financial Resource Strain:   . Difficulty of Paying Living Expenses: Not on file  Food Insecurity:   . Worried About Charity fundraiser in the Last Year: Not on file  . Ran Out of Food in the Last Year: Not on file  Transportation Needs:   . Lack of Transportation (Medical): Not on file  . Lack of Transportation (Non-Medical): Not on file  Physical Activity:   . Days of Exercise per Week: Not on file  . Minutes of Exercise per Session: Not on file  Stress:   . Feeling of Stress : Not on file  Social Connections:   .  Frequency of Communication with Friends and Family: Not on file  . Frequency of Social Gatherings with Friends and Family: Not on file  . Attends Religious Services: Not on file  . Active Member of Clubs or Organizations: Not on file  . Attends Archivist Meetings: Not on file  . Marital Status: Not on file  Intimate Partner Violence:   . Fear of Current or Ex-Partner: Not on file  . Emotionally Abused: Not on file  . Physically Abused: Not on file  . Sexually Abused: Not on file    Family History  Problem Relation Age of Onset  . Heart attack Father   . Clotting disorder Paternal Grandmother     No  Known Allergies   Review of Systems   Review of Systems: Negative Unless Checked Constitutional: [] Weight loss  [] Fever  [] Chills Cardiac: [] Chest pain   []  Atrial Fibrillation  [] Palpitations   [] Shortness of breath when laying flat   [] Shortness of breath with exertion. [] Shortness of breath at rest Vascular:  [] Pain in legs with walking   [] Pain in legs with standing [] Pain in legs when laying flat   [] Claudication    [] Pain in feet when laying flat    [] History of DVT   [] Phlebitis   [] Swelling in legs   [] Varicose veins   [] Non-healing ulcers Pulmonary:   [] Uses home oxygen   [] Productive cough   [] Hemoptysis   [] Wheeze  [] COPD   [] Asthma Neurologic:  [] Dizziness   [] Seizures  [] Blackouts [] History of stroke   [] History of TIA  [] Aphasia   [] Temporary Blindness   [] Weakness or numbness in arm   [] Weakness or numbness in leg Musculoskeletal:   [] Joint swelling   [] Joint pain   [] Low back pain  []  History of Knee Replacement [] Arthritis [] back Surgeries  []  Spinal Stenosis    Hematologic:  [] Easy bruising  [] Easy bleeding   [x] Hypercoagulable state   [] Anemic Gastrointestinal:  [] Diarrhea   [] Vomiting  [x] Gastroesophageal reflux/heartburn   [] Difficulty swallowing. [] Abdominal pain Genitourinary:  [] Chronic kidney disease   [] Difficult urination  [] Anuric   [] Blood in urine [] Frequent urination  [] Burning with urination   [] Hematuria Skin:  [] Rashes   [] Ulcers [] Wounds Psychological:  [] History of anxiety   []  History of major depression  []  Memory Difficulties      OBJECTIVE:   Physical Exam  BP 122/79   Pulse 71   Resp 12   Ht 6\' 2"  (1.88 m)   Wt 216 lb (98 kg)   BMI 27.73 kg/m   Gen: WD/WN, NAD Head: [place]/AT, No temporalis wasting.  Ear/Nose/Throat: Hearing grossly intact, nares w/o erythema or drainage Eyes: PER, EOMI, sclera nonicteric.  Neck: Supple, no masses.  No JVD.  Pulmonary:  Good air movement, no use of accessory muscles.  Cardiac: RRR Vascular: minimal edema    Gastrointestinal: soft, non-distended. No guarding/no peritoneal signs.  Musculoskeletal: M/S 5/5 throughout.  No deformity or atrophy.  Neurologic: Pain and light touch intact in extremities.  Symmetrical.  Speech is fluent. Motor exam as listed above. Psychiatric: Judgment intact, Mood & affect appropriate for pt's clinical situation.        ASSESSMENT AND PLAN:  1. Bilateral cold feet Based on noninvasive studies it is very likely that the cool feet is more so related to the patient being on blood thinners.  Patient also does admit that with wearing medical grade 1 compression stockings the coolness has reduced some.  Based on study today there is no evidence  of any limb threatening concerns.  Patient will follow-up in regards to this on an as-needed basis.  2. Acute saddle pulmonary embolism with acute cor pulmonale ([place]) Patient appears to be recovering well and his stamina is greatly increased.  The patient still has upcoming testing to look into possible causes including aspiration of renal cyst.  Patient still taking Eliquis twice a day.  After discussion with the patient was there is a compelling cause to his pulmonary embolism that he will likely be on it for a lifetime.  Advised him to discuss with the physician aspirating his cyst if anticoagulation should be stopped or if bridging will be needed.  If bridging is in fact needed, we will help facilitate.   Current Outpatient Medications on File Prior to Visit  Medication Sig Dispense Refill  . ALPRAZolam (XANAX) 0.5 MG tablet Take 0.5 mg by mouth at bedtime as needed for sleep.     Marland Kitchen apixaban (ELIQUIS) 5 MG TABS tablet Take 1 tablet (5 mg total) by mouth 2 (two) times daily. 60 tablet 11  . APPLE CIDER VINEGAR PO Take 3 capsules by mouth daily.     Marland Kitchen aspirin EC 81 MG tablet Take 81 mg by mouth daily.    . cholecalciferol (VITAMIN D) 25 MCG (1000 UT) tablet Take 1,000 Units by mouth daily.    . Chromium Picolinate 800 MCG TABS  Take by mouth.    . cyclobenzaprine (FLEXERIL) 10 MG tablet Take 10 mg by mouth daily as needed for muscle spasms.     . Flaxseed, Linseed, (FLAXSEED OIL) 1200 MG CAPS Take 2,400 mg by mouth daily.    Marland Kitchen glucosamine-chondroitin 500-400 MG tablet Take 1 tablet by mouth 3 (three) times daily.    . hydrochlorothiazide (HYDRODIURIL) 25 MG tablet Take 25 mg by mouth daily.    . Magnesium Oxide 500 MG TABS Take 500 mg by mouth daily.    . Omega-3 Fatty Acids (FISH OIL) 1000 MG CAPS Take 2,000 mg by mouth daily.    Marland Kitchen omeprazole (PRILOSEC) 40 MG capsule Take 40 mg by mouth daily before breakfast.    . pyridOXINE (VITAMIN B-6) 100 MG tablet Take 100 mg by mouth daily.    Marland Kitchen rOPINIRole (REQUIP XL) 2 MG 24 hr tablet Take 2 mg by mouth at bedtime.    . sertraline (ZOLOFT) 50 MG tablet Take 50 mg by mouth at bedtime.    . simvastatin (ZOCOR) 20 MG tablet Take 20 mg by mouth at bedtime.    . triamcinolone (NASACORT ALLERGY 24HR) 55 MCG/ACT AERO nasal inhaler Place 2 sprays into the nose daily.    Marland Kitchen triamcinolone cream (KENALOG) 0.1 % Apply 1 application topically 2 (two) times daily as needed (for itching legs.).    Marland Kitchen Turmeric (QC TUMERIC COMPLEX PO) Take 1,000 mg by mouth.    . Eliquis DVT/PE Starter Pack (ELIQUIS STARTER PACK) 5 MG TABS Take as directed on package: start with two-5mg  tablets twice daily for 7 days. On day 8, switch to one-5mg  tablet twice daily. (Patient not taking: Reported on 03/23/2019) 1 each 0   No current facility-administered medications on file prior to visit.    There are no Patient Instructions on file for this visit. No follow-ups on file.   Kris Hartmann, NP  This note was completed with Sales executive.  Any errors are purely unintentional.

## 2019-07-08 ENCOUNTER — Telehealth (INDEPENDENT_AMBULATORY_CARE_PROVIDER_SITE_OTHER): Payer: Self-pay

## 2019-07-09 NOTE — Telephone Encounter (Signed)
Patient has been made aware with medical advice and verbalized understanding 

## 2019-07-09 NOTE — Telephone Encounter (Signed)
I discussed his case with Dr. Delana Meyer and he said that because it's a biopsy we wouldn't need to bridge, simply because starting lovenox before the biopsy places him at the same risk for bleeding as being on eliquis.  He said that by the time he has his biopsy it will be 6 months since the initial event and it would be safe to stop eliquis two days before and restart the day after the biopsy.  As far as traveling after the biopsy, I would discuss that with the office that is doing his biopsy as they are probably more of aware of the risks following that procedure.

## 2019-08-08 NOTE — Progress Notes (Signed)
[place]  [phone] [phone]  ID: Nathan Scott OB: 1960/07/16  MR#: [id number]  [id number]  Patient Care Team: Rusty Aus, MD as PCP - General (Internal Medicine)  I connected with Nathan Scott on 08/12/19 at  3:00 PM EDT by video enabled telemedicine visit and verified that I am speaking with the correct person using two identifiers.   I discussed the limitations, risks, security and privacy concerns of performing an evaluation and management service by telemedicine and the availability of in-person appointments. I also discussed with the patient that there may be a patient responsible charge related to this service. The patient expressed understanding and agreed to proceed.   Other persons participating in the visit and their role in the encounter: Patient, MD.  Patient's location: Home. Provider's location: Clinic.  CHIEF COMPLAINT: Pulmonary embolism, DVT  INTERVAL HISTORY: Patient agreed to video enabled telemedicine visit for further evaluation.  He continues to tolerate Eliquis without significant side effects.  He was recently evaluated at [facility] for his renal cysts, but had an unsuccessful aspiration procedure approximately 1 week ago.  He currently feels well and is asymptomatic. He has no neurologic complaints.  He denies any recent fevers or illnesses.  He has a good appetite and denies weight loss.  He has no chest pain, cough, or hemoptysis.  He denies any nausea, vomiting, constipation, or diarrhea.  He has no urinary complaints.  Patient offers no further specific complaints today.  REVIEW OF SYSTEMS:   Review of Systems  Constitutional: Negative.  Negative for fever, malaise/fatigue and weight loss.  Respiratory: Negative.  Negative for cough, hemoptysis and shortness of breath.   Cardiovascular: Negative.  Negative for chest pain and leg swelling.  Gastrointestinal: Negative.  Negative for abdominal pain,  blood in stool and melena.  Genitourinary: Negative.  Negative for dysuria.  Musculoskeletal: Negative.  Negative for back pain.  Skin: Negative.  Negative for rash.  Neurological: Negative.  Negative for dizziness, focal weakness, weakness and headaches.  Psychiatric/Behavioral: Negative.  The patient is not nervous/anxious.     As per HPI. Otherwise, a complete review of systems is negative.  PAST MEDICAL HISTORY: Past Medical History:  Diagnosis Date  . 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 GERD (gastroesophageal reflux disease)   . Pulmonary embolus ([place])   . Sleep apnea    no cpap x 8 years    PAST SURGICAL HISTORY: Past Surgical History:  Procedure Laterality Date  . COLONOSCOPY WITH PROPOFOL N/A 02/24/2019   Procedure: COLONOSCOPY WITH PROPOFOL;  Surgeon: Lucilla Lame, MD;  Location: [facility] ENDOSCOPY;  Service: Endoscopy;  Laterality: N/A;  . FRACTURE SURGERY Left    pinning leg fx  . LEFT HEART CATH AND CORONARY ANGIOGRAPHY Left 01/12/2019   Procedure: LEFT HEART CATH AND CORONARY ANGIOGRAPHY;  Surgeon: Teodoro Spray, MD;  Location: [place] CV LAB;  Service: Cardiovascular;  Laterality: Left;  . NASAL SEPTOPLASTY W/ TURBINOPLASTY Bilateral 02/20/2016   Procedure: NASAL SEPTOPLASTY WITH TURBINATE REDUCTION;  Surgeon: Margaretha Sheffield, MD;  Location: [facility] ORS;  Service: ENT;  Laterality: Bilateral;  . PULMONARY THROMBECTOMY Bilateral 01/22/2019   Procedure: PULMONARY THROMBECTOMY;  Surgeon: Katha Cabal, MD;  Location: [place] CV LAB;  Service: Cardiovascular;  Laterality: Bilateral;    FAMILY HISTORY: Family History  Problem Relation Age of Onset  . Heart attack Father   . Clotting disorder Paternal Grandmother     ADVANCED DIRECTIVES (Y/N):  N  HEALTH MAINTENANCE:  Social History   Tobacco Use  . Smoking status: Never Smoker  . Smokeless tobacco: Never Used  Substance Use Topics  . Alcohol use: No  . Drug use: No     Colonoscopy:  PAP:  Bone  density:  Lipid panel:  No Known Allergies  Current Outpatient Medications  Medication Sig Dispense Refill  . ALPRAZolam (XANAX) 0.5 MG tablet Take 0.5 mg by mouth at bedtime as needed for sleep.     Marland Kitchen apixaban (ELIQUIS) 5 MG TABS tablet Take 1 tablet (5 mg total) by mouth 2 (two) times daily. 60 tablet 11  . APPLE CIDER VINEGAR PO Take 3 capsules by mouth daily.     Marland Kitchen aspirin EC 81 MG tablet Take 81 mg by mouth daily.    . cholecalciferol (VITAMIN D) 25 MCG (1000 UT) tablet Take 1,000 Units by mouth daily.    . Chromium Picolinate 800 MCG TABS Take by mouth.    . cyclobenzaprine (FLEXERIL) 10 MG tablet Take 10 mg by mouth daily as needed for muscle spasms.     . Flaxseed, Linseed, (FLAXSEED OIL) 1200 MG CAPS Take 2,400 mg by mouth daily.    Marland Kitchen glucosamine-chondroitin 500-400 MG tablet Take 1 tablet by mouth daily.     . hydrochlorothiazide (HYDRODIURIL) 25 MG tablet Take 25 mg by mouth daily.    . Magnesium Oxide 500 MG TABS Take 500 mg by mouth daily.    . Omega-3 Fatty Acids (FISH OIL) 1000 MG CAPS Take 2,000 mg by mouth daily.    Marland Kitchen omeprazole (PRILOSEC) 40 MG capsule Take 40 mg by mouth daily before breakfast.    . pyridOXINE (VITAMIN B-6) 100 MG tablet Take 100 mg by mouth daily.    Marland Kitchen rOPINIRole (REQUIP XL) 2 MG 24 hr tablet Take 2 mg by mouth at bedtime.    . sertraline (ZOLOFT) 50 MG tablet Take 50 mg by mouth at bedtime.    . simvastatin (ZOCOR) 20 MG tablet Take 20 mg by mouth at bedtime.    . triamcinolone (NASACORT ALLERGY 24HR) 55 MCG/ACT AERO nasal inhaler Place 2 sprays into the nose daily.    Marland Kitchen triamcinolone cream (KENALOG) 0.1 % Apply 1 application topically 2 (two) times daily as needed (for itching legs.).    Marland Kitchen Turmeric (QC TUMERIC COMPLEX PO) Take 1,000 mg by mouth.     No current facility-administered medications for this visit.    OBJECTIVE: There were no vitals filed for this visit.   There is no height or weight on file to calculate BMI.    ECOG FS:0 -  Asymptomatic  General: Well-developed, well-nourished, no acute distress. HEENT: Normocephalic. Neuro: Alert, answering all questions appropriately. Cranial nerves grossly intact. Psych: Normal affect.  LAB RESULTS:  Lab Results  Component Value Date   NA 136 01/23/2019   K 3.9 01/23/2019   CL 106 01/23/2019   CO2 25 01/23/2019   GLUCOSE 114 (H) 01/23/2019   BUN 25 (H) 01/23/2019   CREATININE 0.94 01/23/2019   CALCIUM 8.0 (L) 01/23/2019   GFRNONAA >60 01/23/2019   GFRAA >60 01/23/2019    Lab Results  Component Value Date   WBC 6.7 01/23/2019   NEUTROABS 5.1 01/22/2019   HGB 11.6 (L) 01/23/2019   HCT 34.0 (L) 01/23/2019   MCV 87.2 01/23/2019   PLT 126 (L) 01/23/2019     STUDIES: No results found.  ASSESSMENT: Pulmonary embolism, DVT  PLAN:    1. Pulmonary embolism, DVT: Patient appears to have no  transient risk factors.  Imaging results reviewed independently and reported as above with no obvious underlying malignancy.  Patient underwent thrombectomy with thrombolysis with significant improvement of his symptoms.  Full hypercoagulable work-up is negative.  Patient will continue Eliquis for a minimum of 1 year and will continue discussions on whether to continue treatment lifelong.  Patient also reports he has an appointment with [facility] hematology in the near future. 2.  Renal cyst: Biopsy and aspiration at [facility] on August 05, 2019 was inconclusive.  Continue with simple observation.  Repeat CT scan in 3 months with video assisted telemedicine visit 1 to 2 days later.  3.  Hematuria: Resolved.  I provided 30 minutes of face-to-face video visit time during this encounter which included chart review, counseling, and coordination of care as documented above.   Patient expressed understanding and was in agreement with this plan. He also understands that He can call clinic at any time with any questions, concerns, or complaints.    Lloyd Huger, MD    08/12/2019 10:55 AM

## 2019-08-11 ENCOUNTER — Encounter: Payer: Self-pay | Admitting: Oncology

## 2019-08-11 ENCOUNTER — Inpatient Hospital Stay: Payer: Commercial Managed Care - PPO | Attending: Oncology | Admitting: Oncology

## 2019-08-11 DIAGNOSIS — I2602 Saddle embolus of pulmonary artery with acute cor pulmonale: Secondary | ICD-10-CM | POA: Diagnosis not present

## 2019-08-11 DIAGNOSIS — N281 Cyst of kidney, acquired: Secondary | ICD-10-CM | POA: Diagnosis not present

## 2019-08-11 NOTE — Progress Notes (Signed)
Patient here today for mychart follow up. States he would like to discuss cyst. He went to [facility] for cyst aspiration but was unable to do procedure. He also states he has an appointment with [facility] for lab work around 6/24 and would like to discuss if he should be looking at vitamin k 1 & 2. Would that be a reason to why he is clotting. Patient denies any pain or other concerns.

## 2019-09-24 ENCOUNTER — Encounter (INDEPENDENT_AMBULATORY_CARE_PROVIDER_SITE_OTHER): Payer: Self-pay | Admitting: Vascular Surgery

## 2019-09-24 ENCOUNTER — Other Ambulatory Visit: Payer: Self-pay

## 2019-09-24 ENCOUNTER — Ambulatory Visit (INDEPENDENT_AMBULATORY_CARE_PROVIDER_SITE_OTHER): Payer: Commercial Managed Care - PPO | Admitting: Vascular Surgery

## 2019-09-24 VITALS — BP 118/79 | HR 69 | Ht 73.0 in | Wt 215.0 lb

## 2019-09-24 DIAGNOSIS — I2782 Chronic pulmonary embolism: Secondary | ICD-10-CM | POA: Diagnosis not present

## 2019-09-24 DIAGNOSIS — E782 Mixed hyperlipidemia: Secondary | ICD-10-CM

## 2019-09-24 DIAGNOSIS — K219 Gastro-esophageal reflux disease without esophagitis: Secondary | ICD-10-CM | POA: Diagnosis not present

## 2019-09-24 DIAGNOSIS — I2609 Other pulmonary embolism with acute cor pulmonale: Secondary | ICD-10-CM

## 2019-09-24 NOTE — Progress Notes (Signed)
MRN : [id number]  Nathan Scott is a 59 y.o. ([birth year]) male who presents with chief complaint of  Chief Complaint  Patient presents with  . Follow-up    U/S Followo up  .  History of Present Illness:   The patient presents to the office for follow-up evaluation status post pulmonary thrombectomy.   Thrombectomy was performed on 01/22/2019: Mechanical thrombectomywith the penumbra CAT 12 right upper, middle and lower lobepulmonary arteriesand left lower lobe pulmonary artery.  The presenting symptoms were pleuritic chest pain and profound SOB.   In fact, the patient is now exercising 30 to 45 minutes on an elliptical without difficulty.  Previous CT angiogram demonstrated a large volume of emboli with significant right heart strain.  The patient notes resolution of his shortness of breath. He denies pleuritic chest pain. He denies persisting cough or hemoptysis.  The patient denies significant leg pain and swelling dependency. The patient notes minimal edema in the morning.   No blood per rectum or blood in any sputum. No excessive bruising per the patient.    Current Meds  Medication Sig  . ALPRAZolam (XANAX) 0.5 MG tablet Take 0.5 mg by mouth at bedtime as needed for sleep.   Marland Kitchen apixaban (ELIQUIS) 5 MG TABS tablet Take 1 tablet (5 mg total) by mouth 2 (two) times daily.  . APPLE CIDER VINEGAR PO Take 3 capsules by mouth daily.   Marland Kitchen aspirin EC 81 MG tablet Take 81 mg by mouth daily.  . cholecalciferol (VITAMIN D) 25 MCG (1000 UT) tablet Take 1,000 Units by mouth daily.  . Chromium Picolinate 800 MCG TABS Take by mouth.  . cyclobenzaprine (FLEXERIL) 10 MG tablet Take 10 mg by mouth daily as needed for muscle spasms.   . Flaxseed, Linseed, (FLAXSEED OIL) 1200 MG CAPS Take 2,400 mg by mouth daily.  Marland Kitchen glucosamine-chondroitin 500-400 MG tablet Take 1 tablet by mouth daily.   . Magnesium Oxide 500 MG TABS Take 500 mg by mouth daily.  . Omega-3 Fatty Acids (FISH  OIL) 1000 MG CAPS Take 2,000 mg by mouth daily.  Marland Kitchen omeprazole (PRILOSEC) 40 MG capsule Take 40 mg by mouth daily before breakfast.  . pyridOXINE (VITAMIN B-6) 100 MG tablet Take 100 mg by mouth daily.  Marland Kitchen rOPINIRole (REQUIP XL) 2 MG 24 hr tablet Take 2 mg by mouth at bedtime.  . sertraline (ZOLOFT) 50 MG tablet Take 50 mg by mouth at bedtime.  . simvastatin (ZOCOR) 20 MG tablet Take 20 mg by mouth at bedtime.  . triamcinolone (NASACORT ALLERGY 24HR) 55 MCG/ACT AERO nasal inhaler Place 2 sprays into the nose daily.  Marland Kitchen triamcinolone cream (KENALOG) 0.1 % Apply 1 application topically 2 (two) times daily as needed (for itching legs.).  Marland Kitchen Turmeric (QC TUMERIC COMPLEX PO) Take 1,000 mg by mouth.    Past Medical History:  Diagnosis Date  . 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 GERD (gastroesophageal reflux disease)   . Pulmonary embolus ([place])   . Sleep apnea    no cpap x 8 years    Past Surgical History:  Procedure Laterality Date  . COLONOSCOPY WITH PROPOFOL N/A 02/24/2019   Procedure: COLONOSCOPY WITH PROPOFOL;  Surgeon: Lucilla Lame, MD;  Location: [facility] ENDOSCOPY;  Service: Endoscopy;  Laterality: N/A;  . FRACTURE SURGERY Left    pinning leg fx  . LEFT HEART CATH AND CORONARY ANGIOGRAPHY Left 01/12/2019   Procedure: LEFT HEART CATH AND CORONARY ANGIOGRAPHY;  Surgeon: Teodoro Spray, MD;  Location: [place] CV LAB;  Service: Cardiovascular;  Laterality: Left;  . NASAL SEPTOPLASTY W/ TURBINOPLASTY Bilateral 02/20/2016   Procedure: NASAL SEPTOPLASTY WITH TURBINATE REDUCTION;  Surgeon: Margaretha Sheffield, MD;  Location: [facility] ORS;  Service: ENT;  Laterality: Bilateral;  . PULMONARY THROMBECTOMY Bilateral 01/22/2019   Procedure: PULMONARY THROMBECTOMY;  Surgeon: Katha Cabal, MD;  Location: [place] [facility] LAB;  Service: Cardiovascular;  Laterality: Bilateral;    Social History Social History   Tobacco Use  . Smoking status: Never Smoker  . Smokeless tobacco: Never Used  Vaping Use  .  Vaping Use: Never used  Substance Use Topics  . Alcohol use: No  . Drug use: No    Family History Family History  Problem Relation Age of Onset  . Heart attack Father   . Clotting disorder Paternal Grandmother     No Known Allergies   REVIEW OF SYSTEMS (Negative unless checked)  Constitutional: [] Weight loss  [] Fever  [] Chills Cardiac: [] Chest pain   [] Chest pressure   [] Palpitations   [] Shortness of breath when laying flat   [] Shortness of breath with exertion. Vascular:  [] Pain in legs with walking   [] Pain in legs at rest  [x] History of DVT   [] Phlebitis   [] Swelling in legs   [] Varicose veins   [] Non-healing ulcers Pulmonary:   [] Uses home oxygen   [] Productive cough   [] Hemoptysis   [] Wheeze  [] COPD   [] Asthma Neurologic:  [] Dizziness   [] Seizures   [] History of stroke   [] History of TIA  [] Aphasia   [] Vissual changes   [] Weakness or numbness in arm   [] Weakness or numbness in leg Musculoskeletal:   [] Joint swelling   [] Joint pain   [] Low back pain Hematologic:  [] Easy bruising  [] Easy bleeding   [] Hypercoagulable state   [] Anemic Gastrointestinal:  [] Diarrhea   [] Vomiting  [] Gastroesophageal reflux/heartburn   [] Difficulty swallowing. Genitourinary:  [] Chronic kidney disease   [] Difficult urination  [] Frequent urination   [] Blood in urine Skin:  [] Rashes   [] Ulcers  Psychological:  [] History of anxiety   []  History of major depression.  Physical Examination  Vitals:   09/24/19 1540  BP: 118/79  Pulse: 69  Weight: 215 lb (97.5 kg)  Height: 6\' 1"  (1.854 m)   Body mass index is 28.37 kg/m. Gen: WD/WN, NAD Head: [place]/AT, No temporalis wasting.  Ear/Nose/Throat: Hearing grossly intact, nares w/o erythema or drainage Eyes: PER, EOMI, sclera nonicteric.  Neck: Supple, no large masses.   Pulmonary:  Good air movement, no audible wheezing bilaterally, no use of accessory muscles.  Cardiac: RRR, no JVD Vascular:  Vessel Right Left  Radial Palpable Palpable    Gastrointestinal: Non-distended. No guarding/no peritoneal signs.  Musculoskeletal: M/S 5/5 throughout.  No deformity or atrophy.  Neurologic: CN 2-12 intact. Symmetrical.  Speech is fluent. Motor exam as listed above. Psychiatric: Judgment intact, Mood & affect appropriate for pt's clinical situation. Dermatologic: No rashes or ulcers noted.  No changes consistent with cellulitis.   CBC Lab Results  Component Value Date   WBC 6.7 01/23/2019   HGB 11.6 (L) 01/23/2019   HCT 34.0 (L) 01/23/2019   MCV 87.2 01/23/2019   PLT 126 (L) 01/23/2019    BMET    Component Value Date/Time   NA 136 01/23/2019 0322   NA 140 03/25/2013 1449   K 3.9 01/23/2019 0322   K 3.8 03/25/2013 1449   CL 106 01/23/2019 0322   CL 107 03/25/2013 1449   CO2 25 01/23/2019 0322  CO2 28 03/25/2013 1449   GLUCOSE 114 (H) 01/23/2019 0322   GLUCOSE 84 03/25/2013 1449   BUN 25 (H) 01/23/2019 0322   BUN 18 03/25/2013 1449   CREATININE 0.94 01/23/2019 0322   CREATININE 0.77 03/25/2013 1449   CALCIUM 8.0 (L) 01/23/2019 0322   CALCIUM 9.0 03/25/2013 1449   GFRNONAA >60 01/23/2019 0322   GFRNONAA >60 03/25/2013 1449   GFRAA >60 01/23/2019 0322   GFRAA >60 03/25/2013 1449   CrCl cannot be calculated (Patient's most recent lab result is older than the maximum 21 days allowed.).  COAG Lab Results  Component Value Date   INR 1.1 01/22/2019    Radiology No results found.   Assessment/Plan 1. 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  His exercise tolerance is excellent.  At the present time he is taking his Eliquis as directed.  His hypercoagulable panel is negative to date.  He does have a second opinion with [facility] hematology around May 2021 and I have encouraged him to keep this appointment.  In the meantime he will continue taking his Eliquis I have recommended he take it for 1 year total given his pulmonary  emboli.  Should the [facility] evaluation also be negative for hypercoagulable state I have told him that we would likely stop his Eliquis at that time.  2. Hyperlipemia, mixed Continue statin as ordered and reviewed, no changes at this time   3. Gastroesophageal reflux disease without esophagitis Continue PPI as already ordered, this medication has been reviewed and there are no changes at this time.  Avoidence of caffeine and alcohol  Moderate elevation of the head of the bed     Hortencia Pilar, MD  09/24/2019 4:02 PM

## 2019-10-04 ENCOUNTER — Encounter (INDEPENDENT_AMBULATORY_CARE_PROVIDER_SITE_OTHER): Payer: Self-pay | Admitting: Vascular Surgery

## 2019-10-13 ENCOUNTER — Other Ambulatory Visit: Payer: Self-pay

## 2019-10-13 ENCOUNTER — Encounter: Payer: Self-pay | Admitting: Physical Therapy

## 2019-10-13 ENCOUNTER — Ambulatory Visit: Payer: Commercial Managed Care - PPO | Attending: Internal Medicine | Admitting: Physical Therapy

## 2019-10-13 VITALS — BP 118/84

## 2019-10-13 DIAGNOSIS — G8929 Other chronic pain: Secondary | ICD-10-CM | POA: Insufficient documentation

## 2019-10-13 DIAGNOSIS — M546 Pain in thoracic spine: Secondary | ICD-10-CM | POA: Diagnosis present

## 2019-10-13 DIAGNOSIS — M545 Low back pain, unspecified: Secondary | ICD-10-CM

## 2019-10-13 DIAGNOSIS — M25552 Pain in left hip: Secondary | ICD-10-CM | POA: Diagnosis present

## 2019-10-13 DIAGNOSIS — M25551 Pain in right hip: Secondary | ICD-10-CM | POA: Insufficient documentation

## 2019-10-13 DIAGNOSIS — M542 Cervicalgia: Secondary | ICD-10-CM | POA: Insufficient documentation

## 2019-10-13 DIAGNOSIS — M62838 Other muscle spasm: Secondary | ICD-10-CM

## 2019-10-13 NOTE — Therapy (Signed)
[facility] [address] [place], [address] Phone: [phone]   Fax:  [phone]  Physical Therapy Evaluation  Patient Details  Name: Nathan Scott MRN: [id number] Date of Birth: 26-Nov-1960 Referring Provider (PT): Rusty Aus, MD   Encounter Date: 10/13/2019   PT End of Session - 10/13/19 1332    Visit Number 1    Number of Visits 24    Date for PT Re-Evaluation 01/05/20    Authorization Type [employer] reporting period from 10/13/2019    Authorization Time Period n/a    Authorization - Visit Number 1    Authorization - Number of Visits 10    Progress Note Due on Visit 10    PT Start Time 1035    PT Stop Time 1200    PT Time Calculation (min) 85 min    Activity Tolerance Patient tolerated treatment well    Behavior During Therapy Anxious;WFL for tasks assessed/performed           Past Medical History:  Diagnosis Date  . 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 GERD (gastroesophageal reflux disease)   . Pulmonary embolus ([place])   . Sleep apnea    no cpap x 8 years    Past Surgical History:  Procedure Laterality Date  . COLONOSCOPY WITH PROPOFOL N/A 02/24/2019   Procedure: COLONOSCOPY WITH PROPOFOL;  Surgeon: Lucilla Lame, MD;  Location: [facility] ENDOSCOPY;  Service: Endoscopy;  Laterality: N/A;  . FRACTURE SURGERY Left    pinning leg fx  . LEFT HEART CATH AND CORONARY ANGIOGRAPHY Left 01/12/2019   Procedure: LEFT HEART CATH AND CORONARY ANGIOGRAPHY;  Surgeon: Teodoro Spray, MD;  Location: [facility];  Service: Cardiovascular;  Laterality: Left;  . NASAL SEPTOPLASTY W/ TURBINOPLASTY Bilateral 02/20/2016   Procedure: NASAL SEPTOPLASTY WITH TURBINATE REDUCTION;  Surgeon: Margaretha Sheffield, MD;  Location: [facility] ORS;  Service: ENT;  Laterality: Bilateral;  . PULMONARY THROMBECTOMY Bilateral 01/22/2019   Procedure: PULMONARY THROMBECTOMY;  Surgeon: Katha Cabal, MD;  Location: [facility];  Service: Cardiovascular;  Laterality: Bilateral;    Vitals:   10/13/19 1120  BP: 118/84      Subjective Assessment - 10/13/19 1127    Subjective Patient reports about 20 something years ago he broke his left leg and if he didn't wear foot orthotics he would get pain in his hips and lower back. Since COVID19 he has been working from home a lot sitting in a poor chair that was bothering his back. He has upgraded his chair which has helped a lot. On Tuesdays and Saturdays he has joined with a group of guys that exercise (stretching, various exercises, trying to improve overall health). Dr. Sabra Heck would like to see him over 200 lbs (he is close to 209 - down a bit). He also walks a mile every day. His back hurts depends on what he is doing. It acts up when he sits a lot and is inactive. He is trying to increase his activity. If the bed is up and he is watching TV and the bed is adjusted for his wife's back problems it might bother him more.  It really hasn't bothered him for a while as long as he is staying limber/moving. That said, he has been working in the garden including bending and lifting yesterday and felt a twinge. He has been active his whole life in various martial arts, hockey, weight lifting, etc. He  broke his leg playing hockey 20 years ago, a compound fracture that was surgically repaired with ORIF for over 4 breaks. Unsure if it changed his leg length. Since then could never train the left calf to get to the same size as the right. He has adhesions of the skin over the anterior tibia. He feels better when he wears his shoes with inserts, but he goes barefoot more in the home (where has he been more recently). He seems unhappy that he is not as flexible with his right hip (abduction) as his left. Has a long history of working out and various martial arts. He usually sleeps on right side, which he finds comfortable, but also toss and turns a lot. He also has neck pain. He had a neck strain  about 30 years ago during [place]. It has been bothering him a little more than a year now. There are times when he is tossing and turning where he hears a distinct crack and there is a pain in the neck. Knows popping it is not necessary a bad thing, but it makes him nervous because he never used to have it. It is an instant on/off. Is concerning him that he did not have this happen before. He also plays trumpet and sometimes the left hand will feel like it is being held in a position too long and it cramps up. Has numbness in his left finger after history of local trauma there.Came to PT because he has talked to his doctor about back pain over the last several years on and off. Usually after he had done a lot of activity in his yard (as he has recently). He thinks half of it is psychological and worried about the cysts in his kidneys that his medical team thought may have been pressing on his blood vessels and may have contributed to the development of the clots that caused the PE. He is trying to be more conscious of how he walks because he sees himself walking hunched over. Trying to be more conscious of how he is naturally doing things. A lot of that is helping. He is worried about his cyst growing. It has been a hard year and he was very caught off guard by the PE that was not readily found and he almost died. Just before the clotting his wife was diagnosed with breast cancer and has been going through treatment for that (another reconstructions surgery coming up this august), both feet operated on, one hand operated on (other hand is supposed to get operated on coming up), and degenerative disc disease in the neck with problems down her arms. Then he threw a 12 inch clot and all of these events are adding up. He is seeing himself getting older and mortality is on his mind. Cyst is on the ven cava. Found cysts on kidneys and were unable to aspirate. Wondering if that is part of his issue. One side is 5 inches or  so. Thought it might be pushing on things that make things short. Also has some stiffness in the shoulders and neck Never knew why they threw a clot. He stays on elliquis to prevent clots. Back pain is centered into R upper glute/pelvic region. Can spread across back, into R glute, and up back to base of ribs. Neck pain is mostly at base of neck. He describes the back pian as tight and dull.     Pertinent History Patient is a 59 y.o. male who presents to outpatient  physical therapy with a referral for medical diagnosis chronic low back pain. This patient's chief complaints consist of chronic low/mid back, neck pain and pain in the B hip regions leading to the following functional deficits: difficulty bending, lifting, twisting, prolonged sitting, sleeping, yardwork, exercising, increases stress level. Relevant past medical history and comorbidities include major pulmonary embolism in 2020 (apparently recovered, no restrictions), hx DVT renal cysts (could not be removed), acid reflux, double hernia sx as a child, right side hernia sx as an adult, left leg fx/surgery, R elbow problems (cortisone shots), L lower leg comminuted fracture with ORIF (~20 yrs ago).  Patient denies hx of cancer, stroke, seizures, lung problem (except PE), major cardiac events (besides DVT, clotting), diabetes, unexplained weight loss, stumbling, dropping things, spinal surgery, major spinal trauma.    Limitations Sitting;Lifting;Standing;House hold activities;Other (comment)   difficulty bending, lifting, twisting, prolonged sitting, sleeping, yardwork, exercising, increases stress level.   Diagnostic tests Abdominal CT 02/02/2019" Musculoskeletal: No blastic or lytic bone lesions. There are foci ofdegenerative change in the thoracic spine. There are nointramuscular lesions. No abdominal wall lesions appreciable. IMPRESSION:1. There is a cyst arising from the medial mid right kidneymeasuring 9.3 x 7.6 x 7.3 cm. This cyst extends medially  to a levelimmediately adjacent to the inferior vena cava. There is slightlocalized narrowing of the inferior vena cava at the site of thecyst without direct impression of the cyst upon the inferior venacava with supine positioning. There is no obstruction of theinferior vena cava in this area. No other large mass evident in theabdomen or pelvis. There are small renal cysts on each side. 2. There are multiple sigmoid and descending colonic diverticulawithout diverticulitis. No bowel obstruction. No abscess in theabdomen or pelvis. Appendix appears normal. 3.  Small hiatal hernia. 4. No renal or ureteral calculi. No hydronephrosis. Urinary bladderwall thickness normal. 5.  Aortic Atherosclerosis (ICD10-I70.0). 6.  Hepatic steatosis.    Patient Stated Goals get healthier    Currently in Pain? Yes    Pain Score 0-No pain   Worst: 2-3/10; Best: 0/10; Neck pain 1-3/10   Pain Location Back   and neck   Pain Orientation Right;Left;Lower;Mid   Back pain is centered into R upper glute/pelvic region. Can spread across back, into R glute, and up back to base of ribs. Neck pain is mostly at base of neck.   Pain Descriptors / Indicators Tightness;Dull   He describes the back pian as tight and dull. neck pain is sudden and breif   Pain Type Chronic pain    Pain Radiating Towards from right to left, down legs into glutes slightly R>L, upper thighs/groin at times, towards upper back. Patient is somewhat vauge    Pain Onset 1 to 4 weeks ago    Pain Frequency Intermittent    Aggravating Factors  inactivity, wallet in back pocket, moving backs of mulch with wheelbarrow, bending and breaking the bag open and pouring it out, at first really standing up without inserts, sitting in a poor chair without back support, prolonged sitting poor posture,    Pain Relieving Factors shoe inserts, sleeping on R side, nicer chair, improved posture, exercising, improved work ergonomic set up    Effect of Pain on Daily Activities Functional  Limitations: difficulty bending, lifting, twisting, prolonged sitting, sleeping, yardwork, exercising, increases stress level.             [facility] PT Assessment - 10/13/19 0001      Assessment   Medical Diagnosis chronic low back pain  Referring Provider (PT) Rusty Aus, MD    Onset Date/Surgical Date --   20 years ago   Hand Dominance Right    Prior Therapy none for current condition prior to this episode of care      Precautions   Precautions None      Restrictions   Weight Bearing Restrictions No      Balance Screen   Has the patient fallen in the past 6 months Yes    How many times? 1   hit the door/dresser (likely passed out)   Has the patient had a decrease in activity level because of a fear of falling?  No    Is the patient reluctant to leave their home because of a fear of falling?  No      Home Environment   Living Environment Private residence   no concerns about getting around home     Prior Function   Level of Independence Independent    Vocation Full time employment    Vocation Requirements deskwork, computerwork    Leisure plays trumpet, yardwork, gardenwork, exercise, playing in bands, major house renovations, wife is having health problems      Cognition   Overall Cognitive Status Within Functional Limits for tasks assessed      Observation/Other Assessments   Focus on Therapeutic Outcomes (FOTO)  FOTO = 70             OBJECTIVE  OBSERVATION/INSPECTION Posture: forward head, rounded shoulders, slightly slumped in sitting. Lumbar curve WNL. No gross leg length differences.  . Tremor: none . Muscle bulk: WFL . Skin: WFL . Bed mobility: rolling and supine <> sit WFL . Transfers: sit <> stand WFL . Gait: grossly WFL for household and short community ambulation. More detailed gait analysis deferred to later date as needed.    NEUROLOGICAL  Upper Motor Neuron Screen Hoffman's, and Clonus (ankle) negative bilaterally Myotomes . C2-T1  appears intact . L2-S2 appears intact Deep Tendon Reflexes R/L  . 3+/3+ Biceps brachii reflex (C5, C6) . 0+/0+ Brachioradialis reflex (C6) - triggers Biceps reflex on each side.  Marland Kitchen 0+/0+ Triceps brachii reflex (C7) . 2+/2+ Quadriceps reflex (L4) . 0+/0+ Achilles reflex (S1) Neurodynamic Tests   . SLR positive bilaterally for posterior thigh pain that is sensitive to dorsiflexion(intensifies)/plantar flexion (relieves)    SPINE MOTION  Cervical Spine AROM *Indicates pain (baseline 1/10 at base of neck).  Flexion: = 50 Extension: = 45 Rotation: R= 65, L = 70. Side Flexion: R= 25, L = 30. (end range discomfort in predicable locations for tissue strain at end range motion).   Lumbar AROM *Indicates pain - Flexion: = fingers to toes, pain/tension in bilateral glute R > L. - Extension: = 50% (eases) - Rotation: B WNL except pulling mid spine both ways. . - Side Flexion: R = distal patella , L = mid patella pulling in R low back and groin, mid back.   PERIPHERAL JOINT MOTION (in degrees) AAROM: BUE WNL  BLE WNL except restricted and guarded from most restricted to least: B hip IR (L >R),  Hip flexion L > R, R hip ER (L only slightly restricted and minimal guarding). Hip extension deferred.   MUSCLE PERFORMANCE (MMT):  BUE 5/5 all motions B LE WNL except left glute pain with left hip abduction (5/5). B hip adduction 4/5. B hip extension deferred.  REPEATED MOTIONS TESTING: Motion/Technique sets x reps During After  Lumbar repeated extension in lying (prone) x10 Feels good  Feels good, no peripheralization   Comments: patient has difficulty relaxing, mild improvement with cuing. Has difficulty staying on task and stops before 10 reps to move to back and do double knees to chest which he reports also feels good.   SPECIAL TESTS: Cervical axial compression: negative (feels better) Cervical axial distraction: negative Cervical Spurling's: mild local pain at ipsilateral base of neck  for both sides.  Straight leg raise (SLR): positive bilaterally for posterior thigh pain that is sensitive to dorsiflexion(intensifies)/plantar flexion (relieves)  FABER: R = painful, unclear where, L = did not test  ACCESSORY MOTION:  - CPA through thoracic and lumbar spine is somewhat tender and hypomobile, with points of most tenderness near T10 and at base of lumbar spine. Patient reports CPA feels good overall despite tenderness.   PALPATION: - Very tense to palpation along spinal paraspinals in thoracic and lumbar spine. TTP especially at R lumbar paraspinals. TTP at B glute med and R deep glute region (pt reports it feels good).   FUNCTIONAL TESTS - squat: excessive genuvalgus and knees forward.   EDUCATION/COGNITION: Patient is alert and oriented X 4.   Objective measurements completed on examination: See above findings.    TREATMENT:  Therapeutic exercise: to centralize symptoms and improve ROM, strength, muscular endurance, and activity tolerance required for successful completion of functional activities.  - MDT repeated lumbar extension in lying x 10 - MDT repeated lumbar extension in standing x5 - Education on diagnosis, prognosis, POC, anatomy and physiology of current condition.  - Education on HEP including handout    HOME EXERCISE PROGRAM  Access Code: [email or web address] URL: [email or web address] Date: 10/13/2019 Prepared by: Rosita Kea  Exercises Prone Press Up - 4 x daily - 10-15 reps - 1 second hold Standing Lumbar Extension - 4 x daily - 10-15 reps - 1 sets - 1 second hold  Patient response to treatment:  Pt tolerated treatment well. Pt was able to complete all exercises with minimal to no lasting increase in pain or discomfort. Pt required multimodal cuing for proper technique and to facilitate improved neuromuscular control, strength, range of motion, and functional ability resulting in improved performance and form.      PT Education - 10/13/19  1332    Education provided Yes    Education Details Exercise purpose/form. Self management techniques. Education on diagnosis, prognosis, POC, anatomy and physiology of current condition Education on HEP including handout    Person(s) Educated Patient    Methods Explanation;Demonstration;Tactile cues;Verbal cues;Handout    Comprehension Verbalized understanding;Returned demonstration;Verbal cues required;Tactile cues required;Need further instruction            PT Short Term Goals - 10/13/19 1440      PT SHORT TERM GOAL #1   Title Be independent with initial home exercise program for self-management of symptoms.    Baseline Initial HEP provided at IE (10/13/2019);    Time 2    Period Weeks    Status New    Target Date 10/27/19             PT Long Term Goals - 10/13/19 1441      PT LONG TERM GOAL #1   Title Be independent with a long-term home exercise program for self-management of symptoms.    Baseline Initial HEP provided at IE (10/13/2019);    Time 12    Period Weeks    Status New    Target Date --   TARGET DATE FOR ALL LONG TERM GOALS: 01/05/2020  PT LONG TERM GOAL #2   Title Demonstrate improved FOTO score to equal or greater than 75 by visit #9 to demonstrate improvement in overall condition and self-reported functional ability.    Baseline 70 (10/13/2019);    Time 12    Period Weeks    Status New      PT LONG TERM GOAL #3   Title Reduce pain with functional activities to equal or less than 1/10 to allow patient to complete usual activities including ADLs, IADLs, and social engagement with less difficulty.    Baseline up to 3/10 (10/13/2019);    Time 12    Period Weeks    Status New      PT LONG TERM GOAL #4   Title Be able to demonstrate floor to waist lift using 25# box for 5 reps with proper form without limitation due to current condition in order to improve ability to lift during usual activities.    Baseline squat with form lacking, reports fear of  lifting (10/13/2019);    Time 12    Period Weeks    Status New      PT LONG TERM GOAL #5   Title Complete community, work and/or recreational activities without limitation due to current condition.    Baseline Functional Limitations: difficulty bending, lifting, twisting, prolonged sitting, sleeping, yardwork, exercising, increases stress level (10/13/2019);    Time 12    Period Weeks    Status New                  Plan - 10/13/19 1453    Clinical Impression Statement Patient is a 59 y.o. male referred to outpatient physical therapy with a medical diagnosis of chronic low back pain who presents with signs and symptoms consistent with chronic low back pain with B sciatic neural tension and radiation of pain in the proximal LEs, B hip pain/stiffness, thoracic and cervical spine pain. Patient appears very concerned about improving his overall health and anxious about his back, hip, neck, and thoracis spine pain and would benefit from further education and reassurance to allow him to fully engage in functional activities with less fear of injury. Patient demonstrates elevated tension throughout his body, especially in his spinal paraspinal muscles and has difficulty performing relaxed breathing. He has hypomobile spinal joints and stiffness and pain in the bilateral hips that suggest possible OA in these joints contributing to his limitations. He demonstrates bilateral nerve tension in the sciatic nerve distribution but no hard neuro signs. He has hyperreflexive DTRs at biceps brachii but no other signs of UMN involvement, so this will be monitored. Did not find evidence that his current complaints are due to non-musculoskeletal source. Patient presents with significant pain, stiffness, muscle spasm, motor control, muscle performance, postural, health knowledge deficits, and activity tolerance impairments that are limiting ability to complete his usual activities including bending, lifting,  twisting, prolonged sitting, sleeping, yardwork, exercising without difficulty and does appear to have increased stress levels related to concerns about his impairments. Patient will benefit from skilled physical therapy intervention to address current body structure impairments and activity limitations to improve function and work towards goals set in current POC in order to return to prior level of function or maximal functional improvement.    Personal Factors and Comorbidities Age;Behavior Pattern;Comorbidity 3+;Sex;Past/Current Experience;Time since onset of injury/illness/exacerbation;Social Background    Comorbidities Relevant past medical history and comorbidities include major pulmonary embolism in 2020 (apparently recovered, no restrictions), hx DVT renal cysts (could not be  removed), acid reflux, double hernia sx as a child, right side hernia sx as an adult, left leg fx/surgery, R elbow problems (cortisone shots), L lower leg comminuted fracture with ORIF (~20 yrs ago).    Examination-Activity Limitations Lift;Bend;Squat;Other;Carry   difficulty bending, lifting, twisting, prolonged sitting, sleeping, yardwork, exercising, increases stress level.   Examination-Participation Restrictions Yard Work;Community Activity;Driving;Interpersonal Relationship    Stability/Clinical Decision Making Evolving/Moderate complexity    Clinical Decision Making Moderate    Rehab Potential Good    PT Frequency 2x / week    PT Duration 12 weeks    PT Treatment/Interventions ADLs/Self Care Home Management;Biofeedback;Cryotherapy;Moist Heat;Traction;Therapeutic activities;Therapeutic exercise;Neuromuscular re-education;Patient/family education;Manual techniques;Passive range of motion;Dry needling;Joint Manipulations;Spinal Manipulations    PT Next Visit Plan manual therapy, joint mobiliizations, funcitonal strengthening, educatoin    PT Home Exercise Plan Medbridge Access Code: [email or web address]    Recommended Other  Services perhaps care to help address anxiety/stress and cope with recent health issues in family during the last year    Consulted and Agree with Plan of Care Patient           Patient will benefit from skilled therapeutic intervention in order to improve the following deficits and impairments:  Pain, Improper body mechanics, Increased muscle spasms, Decreased activity tolerance, Decreased endurance, Decreased range of motion, Hypomobility, Impaired perceived functional ability, Impaired flexibility  Visit Diagnosis: Chronic bilateral low back pain, unspecified whether sciatica present  Pain in right hip  Pain in left hip  Pain in thoracic spine  Cervicalgia  Other muscle spasm     Problem List Patient Active Problem List   Diagnosis Date Noted  . Special screening for malignant neoplasms, colon   . Other pulmonary embolism with acute cor pulmonale ([place]) 02/11/2019  . GERD (gastroesophageal reflux disease) 02/02/2019  . Primary hypercoagulable state ([place]) 02/02/2019  . Renal cyst 02/02/2019  . At risk for secondary malignancy 02/02/2019  . Pulmonary embolism ([place]) 01/22/2019  . Daytime somnolence 01/19/2019  . SOB (shortness of breath) 01/19/2019  . Family history of ASCVD (arteriosclerotic cardiovascular disease) 07/17/2017  . Hyperlipemia, mixed 05/04/2014    Everlean Alstrom. Graylon Good, PT, DPT 10/13/19, 2:57 PM  [facility] [address], [place], [address] Phone: [phone]   Fax:  [phone]  Name: Aaric Dolph MRN: [id number] Date of Birth: 1960-10-05

## 2019-10-15 ENCOUNTER — Other Ambulatory Visit: Payer: Self-pay

## 2019-10-15 ENCOUNTER — Ambulatory Visit: Payer: Commercial Managed Care - PPO | Attending: Internal Medicine | Admitting: Physical Therapy

## 2019-10-15 ENCOUNTER — Encounter: Payer: Self-pay | Admitting: Physical Therapy

## 2019-10-15 DIAGNOSIS — M25551 Pain in right hip: Secondary | ICD-10-CM

## 2019-10-15 DIAGNOSIS — M545 Low back pain, unspecified: Secondary | ICD-10-CM

## 2019-10-15 DIAGNOSIS — M25552 Pain in left hip: Secondary | ICD-10-CM

## 2019-10-15 DIAGNOSIS — M542 Cervicalgia: Secondary | ICD-10-CM | POA: Diagnosis present

## 2019-10-15 DIAGNOSIS — M62838 Other muscle spasm: Secondary | ICD-10-CM

## 2019-10-15 DIAGNOSIS — G8929 Other chronic pain: Secondary | ICD-10-CM | POA: Diagnosis present

## 2019-10-15 DIAGNOSIS — M546 Pain in thoracic spine: Secondary | ICD-10-CM

## 2019-10-15 NOTE — Therapy (Signed)
[facility] [address], [place], [address] Phone: [phone]   Fax:  [phone]  Physical Therapy Treatment  Patient Details  Name: Nathan Scott MRN: [id number] Date of Birth: 1961-03-14 Referring Provider (PT): Rusty Aus, MD   Encounter Date: 10/15/2019   PT End of Session - 10/15/19 1932    Visit Number 2    Number of Visits 24    Date for PT Re-Evaluation 01/05/20    Authorization Type [employer] reporting period from 10/13/2019    Authorization Time Period n/a    Authorization - Visit Number 2    Authorization - Number of Visits 10    Progress Note Due on Visit 10    PT Start Time 3474    PT Stop Time 1740    PT Time Calculation (min) 50 min    Activity Tolerance Patient tolerated treatment well    Behavior During Therapy [facility] for tasks assessed/performed           Past Medical History:  Diagnosis Date  . 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 GERD (gastroesophageal reflux disease)   . Pulmonary embolus ([place])   . Sleep apnea    no cpap x 8 years    Past Surgical History:  Procedure Laterality Date  . COLONOSCOPY WITH PROPOFOL N/A 02/24/2019   Procedure: COLONOSCOPY WITH PROPOFOL;  Surgeon: Lucilla Lame, MD;  Location: [facility] ENDOSCOPY;  Service: Endoscopy;  Laterality: N/A;  . FRACTURE SURGERY Left    pinning leg fx  . LEFT HEART CATH AND CORONARY ANGIOGRAPHY Left 01/12/2019   Procedure: LEFT HEART CATH AND CORONARY ANGIOGRAPHY;  Surgeon: Teodoro Spray, MD;  Location: [place];  Service: Cardiovascular;  Laterality: Left;  . NASAL SEPTOPLASTY W/ TURBINOPLASTY Bilateral 02/20/2016   Procedure: NASAL SEPTOPLASTY WITH TURBINATE REDUCTION;  Surgeon: Margaretha Sheffield, MD;  Location: [facility] ORS;  Service: ENT;  Laterality: Bilateral;  . PULMONARY THROMBECTOMY Bilateral 01/22/2019   Procedure: PULMONARY THROMBECTOMY;  Surgeon: Katha Cabal, MD;  Location: [place] [facility];   Service: Cardiovascular;  Laterality: Bilateral;    There were no vitals filed for this visit.   Subjective Assessment - 10/15/19 1653    Subjective Patient reports he did a lot of lifting (10 boxes of tiles) and did some exercise with his exercise group that made him a bit sore, but it was not that bothersome to him and he has no pain upon arrival. His arms were shaking when doing the press ups. Patient started expressing more concern about his hip mobility, especially for abduction and a tight feeling in his adductors by the end of the session. would prefer 1x a week frequency.    Pertinent History Patient is a 59 y.o. male who presents to outpatient physical therapy with a referral for medical diagnosis chronic low back pain. This patient's chief complaints consist of chronic low/mid back, neck pain and pain in the B hip regions leading to the following functional deficits: difficulty bending, lifting, twisting, prolonged sitting, sleeping, yardwork, exercising, increases stress level. Relevant past medical history and comorbidities include major pulmonary embolism in 2020 (apparently recovered, no restrictions), hx DVT renal cysts (could not be removed), acid reflux, double hernia sx as a child, right side hernia sx as an adult, left leg fx/surgery, R elbow problems (cortisone shots), L lower leg comminuted fracture with ORIF (~20 yrs ago).  Patient denies hx of cancer, stroke, seizures, lung problem (except  PE), major cardiac events (besides DVT, clotting), diabetes, unexplained weight loss, stumbling, dropping things, spinal surgery, major spinal trauma.    Limitations Sitting;Lifting;Standing;House hold activities;Other (comment)   difficulty bending, lifting, twisting, prolonged sitting, sleeping, yardwork, exercising, increases stress level.   Diagnostic tests Abdominal CT 02/02/2019" Musculoskeletal: No blastic or lytic bone lesions. There are foci ofdegenerative change in the thoracic spine.  There are nointramuscular lesions. No abdominal wall lesions appreciable. IMPRESSION:1. There is a cyst arising from the medial mid right kidneymeasuring 9.3 x 7.6 x 7.3 cm. This cyst extends medially to a levelimmediately adjacent to the inferior vena cava. There is slightlocalized narrowing of the inferior vena cava at the site of thecyst without direct impression of the cyst upon the inferior venacava with supine positioning. There is no obstruction of theinferior vena cava in this area. No other large mass evident in theabdomen or pelvis. There are small renal cysts on each side. 2. There are multiple sigmoid and descending colonic diverticulawithout diverticulitis. No bowel obstruction. No abscess in theabdomen or pelvis. Appendix appears normal. 3.  Small hiatal hernia. 4. No renal or ureteral calculi. No hydronephrosis. Urinary bladderwall thickness normal. 5.  Aortic Atherosclerosis (ICD10-I70.0). 6.  Hepatic steatosis.    Patient Stated Goals get healthier    Currently in Pain? No/denies    Pain Onset 1 to 4 weeks ago            TREATMENT:  Therapeutic exercise:to centralize symptoms and improve ROM, strength, muscular endurance, and activity tolerance required for successful completion of functional activities.  - MDT repeated lumbar extension in lying x 10 - supine sciatic nerve floss holding back of knee with towel x 15 each side (slider technique) - further assessment of hip mobility (patient wearing jeans): patient able to relax better this session, has most restricted and painful R hip IR but more ER on R than left (both [facility]), mild hip flexor tightness (could be improved). Patient most concerned about difficulty with forward flexion with B legs abducted while attempting to stretch into side splits/pancake position.  - FABER: B = pain in posterior/lateral hip with OP -  Practiced shin box position with hip ER and IR stretch to promote improved hip mobility.  - adductor rock back x 10  each side - brief cat camel to learn how to find more neutral spine and returned to adductor rock backs with slightly improved technique x 5 each side.  - education on stretching and anatomy and why he feels tighter in one position than another.  - Education on diagnosis, prognosis, POC, anatomy and physiology of current condition. Included education on nature of musculoskeletal symptoms vs non-musculoskeletal symptoms. - Education on HEP  Manual therapy: to reduce pain and tissue tension, improve range of motion, neuromodulation, in order to promote improved ability to complete functional activities. Prone position:  - STM to posterior thoracic and lumbar musculature and bilateral glutes to decrease tension. Especially tender near piriformis with radiation down L leg that was relieved with removal of pressure. Also utilized foam roller and "the stick" to assist STM. Tension noted throughout spine.  To address tightness noted by pt at upper thoracic spine:  - prone upper thoracic grade V CPA, no cavitation - hooklying over foam roll positioned at upper thoracic spine, grade V thrust through elbows behind head, no cavitation. Repeated x 2 with EK wedge positioned near point of tenderness, cavitation and relief with second rep.    HOME EXERCISE PROGRAM  Access Code: [email or web address] URL:  [email or web address] Date: 10/13/2019 Prepared by: Rosita Kea  Exercises Prone Press Up - 4 x daily - 10-15 reps - 1 second hold Standing Lumbar Extension - 4 x daily - 10-15 reps - 1 sets - 1 second hold    PT Education - 10/15/19 1932    Education provided Yes    Education Details Exercise purpose/form. Self management techniques. Education on diagnosis, prognosis, POC, anatomy and physiology of current condition    Person(s) Educated Patient    Methods Explanation;Demonstration;Tactile cues;Verbal cues    Comprehension Verbalized understanding;Returned demonstration;Verbal cues  required;Tactile cues required;Need further instruction            PT Short Term Goals - 10/13/19 1440      PT SHORT TERM GOAL #1   Title Be independent with initial home exercise program for self-management of symptoms.    Baseline Initial HEP provided at IE (10/13/2019);    Time 2    Period Weeks    Status New    Target Date 10/27/19             PT Long Term Goals - 10/13/19 1441      PT LONG TERM GOAL #1   Title Be independent with a long-term home exercise program for self-management of symptoms.    Baseline Initial HEP provided at IE (10/13/2019);    Time 12    Period Weeks    Status New    Target Date --   TARGET DATE FOR ALL LONG TERM GOALS: 01/05/2020     PT LONG TERM GOAL #2   Title Demonstrate improved FOTO score to equal or greater than 75 by visit #9 to demonstrate improvement in overall condition and self-reported functional ability.    Baseline 70 (10/13/2019);    Time 12    Period Weeks    Status New      PT LONG TERM GOAL #3   Title Reduce pain with functional activities to equal or less than 1/10 to allow patient to complete usual activities including ADLs, IADLs, and social engagement with less difficulty.    Baseline up to 3/10 (10/13/2019);    Time 12    Period Weeks    Status New      PT LONG TERM GOAL #4   Title Be able to demonstrate floor to waist lift using 25# box for 5 reps with proper form without limitation due to current condition in order to improve ability to lift during usual activities.    Baseline squat with form lacking, reports fear of lifting (10/13/2019);    Time 12    Period Weeks    Status New      PT LONG TERM GOAL #5   Title Complete community, work and/or recreational activities without limitation due to current condition.    Baseline Functional Limitations: difficulty bending, lifting, twisting, prolonged sitting, sleeping, yardwork, exercising, increases stress level (10/13/2019);    Time 12    Period Weeks    Status New                  Plan - 10/15/19 1952    Clinical Impression Statement Patient tolerated treatment well overall with no increase in pain and report of improved comfort after thrust joint mobilization with cavitation. Patient was particularly tender to STM at deep glutes. Brought up concerns about tight adductors near end of session and wondered if dry needling could help. Completed further assessment of hip mobility. Continues to demonstrate biggest limitation in R hip  IR. FABER produced posterolateral hip pain bilaterally with overpressure. 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 Would be best addressed through long term consistent stretching techniques and program. Plan to further address at next session. Patient would benefit from continued management of limiting condition by skilled physical therapist to address remaining impairments and functional limitations to work towards stated goals and return to PLOF or maximal functional independence.    Personal Factors and Comorbidities Age;Behavior Pattern;Comorbidity 3+;Sex;Past/Current Experience;Time since onset of injury/illness/exacerbation;Social Background    Comorbidities Relevant past medical history and comorbidities include major pulmonary embolism in 2020 (apparently recovered, no restrictions), hx DVT renal cysts (could not be removed), acid reflux, double hernia sx as a child, right side hernia sx as an adult, left leg fx/surgery, R elbow problems (cortisone shots), L lower leg comminuted fracture with ORIF (~20 yrs ago).    Examination-Activity Limitations Lift;Bend;Squat;Other;Carry   difficulty bending, lifting, twisting, prolonged sitting, sleeping, yardwork, exercising, increases stress level.   Examination-Participation Restrictions Yard Work;Community Activity;Driving;Interpersonal Relationship    Stability/Clinical Decision Making Evolving/Moderate  complexity    Rehab Potential Good    PT Frequency 2x / week    PT Duration 12 weeks    PT Treatment/Interventions ADLs/Self Care Home Management;Biofeedback;Cryotherapy;Moist Heat;Traction;Therapeutic activities;Therapeutic exercise;Neuromuscular re-education;Patient/family education;Manual techniques;Passive range of motion;Dry needling;Joint Manipulations;Spinal Manipulations    PT Next Visit Plan manual therapy, joint mobiliizations, funcitonal strengthening, educatoin    PT Home Exercise Plan Medbridge Access Code: [email or web address]    Consulted and Agree with Plan of Care Patient           Patient will benefit from skilled therapeutic intervention in order to improve the following deficits and impairments:  Pain, Improper body mechanics, Increased muscle spasms, Decreased activity tolerance, Decreased endurance, Decreased range of motion, Hypomobility, Impaired perceived functional ability, Impaired flexibility  Visit Diagnosis: Chronic bilateral low back pain, unspecified whether sciatica present  Pain in right hip  Pain in left hip  Pain in thoracic spine  Cervicalgia  Other muscle spasm     Problem List Patient Active Problem List   Diagnosis Date Noted  . Special screening for malignant neoplasms, colon   . Other pulmonary embolism with acute cor pulmonale ([place]) 02/11/2019  . GERD (gastroesophageal reflux disease) 02/02/2019  . Primary hypercoagulable state ([place]) 02/02/2019  . Renal cyst 02/02/2019  . At risk for secondary malignancy 02/02/2019  . Pulmonary embolism ([place]) 01/22/2019  . Daytime somnolence 01/19/2019  . SOB (shortness of breath) 01/19/2019  . Family history of ASCVD (arteriosclerotic cardiovascular disease) 07/17/2017  . Hyperlipemia, mixed 05/04/2014   Everlean Alstrom. Graylon Good, PT, DPT 10/15/19, 7:52 PM  [facility] [address], [place], [address] Phone: [phone]   Fax:   [phone]  Name: Nathan Scott MRN: [id number] Date of Birth: [birth year]

## 2019-10-20 ENCOUNTER — Ambulatory Visit: Payer: Commercial Managed Care - PPO | Admitting: Physical Therapy

## 2019-10-22 ENCOUNTER — Ambulatory Visit: Payer: Commercial Managed Care - PPO | Admitting: Physical Therapy

## 2019-10-22 ENCOUNTER — Encounter: Payer: Self-pay | Admitting: Physical Therapy

## 2019-10-22 ENCOUNTER — Other Ambulatory Visit: Payer: Self-pay

## 2019-10-22 DIAGNOSIS — G8929 Other chronic pain: Secondary | ICD-10-CM

## 2019-10-22 DIAGNOSIS — M25551 Pain in right hip: Secondary | ICD-10-CM

## 2019-10-22 DIAGNOSIS — M545 Low back pain: Secondary | ICD-10-CM | POA: Diagnosis not present

## 2019-10-22 DIAGNOSIS — M542 Cervicalgia: Secondary | ICD-10-CM

## 2019-10-22 DIAGNOSIS — M546 Pain in thoracic spine: Secondary | ICD-10-CM

## 2019-10-22 DIAGNOSIS — M62838 Other muscle spasm: Secondary | ICD-10-CM

## 2019-10-22 DIAGNOSIS — M25552 Pain in left hip: Secondary | ICD-10-CM

## 2019-10-22 NOTE — Therapy (Signed)
[facility] [address], [place], [address] Phone: [phone]   Fax:  [phone]  Physical Therapy Treatment  Patient Details  Name: Nathan Scott MRN: [id number] Date of Birth: [birth year] Referring Provider (PT): Rusty Aus, MD   Encounter Date: 10/22/2019   PT End of Session - 10/22/19 1759    Visit Number 3    Number of Visits 24    Date for PT Re-Evaluation 01/05/20    Authorization Type Gap reporting period from 10/13/2019    Authorization Time Period n/a    Authorization - Visit Number 3    Authorization - Number of Visits 10    Progress Note Due on Visit 10    PT Start Time 1755    PT Stop Time 1900    PT Time Calculation (min) 65 min    Activity Tolerance Patient tolerated treatment well    Behavior During Therapy [facility] for tasks assessed/performed           Past Medical History:  Diagnosis Date  . 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 GERD (gastroesophageal reflux disease)   . Pulmonary embolus ([place])   . Sleep apnea    no cpap x 8 years    Past Surgical History:  Procedure Laterality Date  . COLONOSCOPY WITH PROPOFOL N/A 02/24/2019   Procedure: COLONOSCOPY WITH PROPOFOL;  Surgeon: Lucilla Lame, MD;  Location[facility] ENDOSCOPY;  Service: Endoscopy;  Laterality: N/A;  . FRACTURE SURGERY Left    pinning leg fx  . LEFT HEART CATH AND CORONARY ANGIOGRAPHY Left 01/12/2019   Procedure: LEFT HEART CATH AND CORONARY ANGIOGRAPHY;  Surgeon: Teodoro Spray, MD;  Location: [place] [facility] CV LAB;  Service: Cardiovascular;  Laterality: Left;  . NASAL SEPTOPLASTY W/ TURBINOPLASTY Bilateral 02/20/2016   Procedure: NASAL SEPTOPLASTY WITH TURBINATE REDUCTION;  Surgeon: Margaretha Sheffield, MD;  Location: [facility] ORS;  Service: ENT;  Laterality: Bilateral;  . PULMONARY THROMBECTOMY Bilateral 01/22/2019   Procedure: PULMONARY THROMBECTOMY;  Surgeon: Katha Cabal, MD;  Location: [facility];   Service: Cardiovascular;  Laterality: Bilateral;    There were no vitals filed for this visit.   Subjective Assessment - 10/22/19 1754    Subjective Patient reports he is feeling well but has had a bit of stress this afternoon that makes him tighten up. He is feeling okay overall. Rates back pain 1/10 currently at the stress across the back of his neck and lower back. patient reports he felt okay following last treatment session. He continues to feel his back is really tense. There is a lot of things going on right now that is making him really tense. He has difficult turning his brain off at night. Upon further thought, pt reports he had quite a bit of soreness in his left lower leg where he has the pins in his leg that he attributes to some of the stretching and hip mobility assessment last session.    Pertinent History Patient is a 59 y.o. male who presents to outpatient physical therapy with a referral for medical diagnosis chronic low back pain. This patient's chief complaints consist of chronic low/mid back, neck pain and pain in the B hip regions leading to the following functional deficits: difficulty bending, lifting, twisting, prolonged sitting, sleeping, yardwork, exercising, increases stress level. Relevant past medical history and comorbidities include major pulmonary embolism in 2020 (apparently recovered, no restrictions), hx DVT renal cysts (could not be removed), acid  reflux, double hernia sx as a child, right side hernia sx as an adult, left leg fx/surgery, R elbow problems (cortisone shots), L lower leg comminuted fracture with ORIF (~20 yrs ago).  Patient denies hx of cancer, stroke, seizures, lung problem (except PE), major cardiac events (besides DVT, clotting), diabetes, unexplained weight loss, stumbling, dropping things, spinal surgery, major spinal trauma.    Limitations Sitting;Lifting;Standing;House hold activities;Other (comment)   difficulty bending, lifting, twisting, prolonged  sitting, sleeping, yardwork, exercising, increases stress level.   Diagnostic tests Abdominal CT 02/02/2019" Musculoskeletal: No blastic or lytic bone lesions. There are foci ofdegenerative change in the thoracic spine. There are nointramuscular lesions. No abdominal wall lesions appreciable. IMPRESSION:1. There is a cyst arising from the medial mid right kidneymeasuring 9.3 x 7.6 x 7.3 cm. This cyst extends medially to a levelimmediately adjacent to the inferior vena cava. There is slightlocalized narrowing of the inferior vena cava at the site of thecyst without direct impression of the cyst upon the inferior venacava with supine positioning. There is no obstruction of theinferior vena cava in this area. No other large mass evident in theabdomen or pelvis. There are small renal cysts on each side. 2. There are multiple sigmoid and descending colonic diverticulawithout diverticulitis. No bowel obstruction. No abscess in theabdomen or pelvis. Appendix appears normal. 3.  Small hiatal hernia. 4. No renal or ureteral calculi. No hydronephrosis. Urinary bladderwall thickness normal. 5.  Aortic Atherosclerosis (ICD10-I70.0). 6.  Hepatic steatosis.    Patient Stated Goals get healthier    Currently in Pain? No/denies    Pain Score 1     Pain Onset 1 to 4 weeks ago           TREATMENT: Therapeutic exercise:to centralize symptoms and improve ROM, strength, muscular endurance, and activity tolerance required for successful completion of functional activities. - Treadmill at 50mph at 0% grade with BUE support. For improved lower extremity mobility, muscular endurance, and weightbearing activity tolerance; and to induce the analgesic effect of aerobic exercise, stimulate improved joint nutrition, and prepare body structures and systems for following interventions. x 5  minutes. During subjective exam and education on relaxation techniques.  - quadruped rock backs x20 with feet together and then apart to bias  IR - adductor rock back x 10 each side with cuing on how to perform correctly.   Provided extensive education on purpose of exercises and correct form. Reviewed sit ups he is doing at home and suggested alternatives that are less likely to irritate back. Patient demo exercises he is doing with exercise class including flutter kicks, various types of push up and plank variations, hollow holds with hip abd/add. Education on relaxation and breathing techniques including handout. Discussed integration of this with exercises to improve breathing for trumpet technique.  - supine curl x 10 with cuing to improve TA activation and decrease motion at the lumbar spine.  - supine deep neck flexor activation with self palpation to prevent overactivity of global stabilizers. ~ 20 reps with minimal hold and x 3 with 5 second hold.  - supine hip adductor stretch with towel on R leg (difficulty stabilizing body but felt good stretch).  - MDT repeated lumbar extension in lying x 10 - supine sciatic nerve floss holding back of knee with towel x 15 each side (slider technique) - discussion on how to integrate exercises into daily routine and address challenges pt expresses.  -Education on HEP including handout.   HOME EXERCISE PROGRAM Access Code: [email or web address] URL: [email or web address] Date:  10/13/2019 Prepared by: Rosita Kea  Exercises Prone Press Up - 4 x daily - 10-15 reps - 1 second hold Standing Lumbar Extension - 4 x daily - 10-15 reps - 1 sets - 1 second hold      PT Education - 10/22/19 1759    Education Details Exercise purpose/form. Self management techniques    Person(s) Educated Patient    Methods Explanation;Demonstration;Tactile cues;Verbal cues    Comprehension Verbalized understanding;Returned demonstration;Verbal cues required;Tactile cues required;Need further instruction            PT Short Term Goals - 10/13/19 1440      PT SHORT TERM GOAL #1   Title Be  independent with initial home exercise program for self-management of symptoms.    Baseline Initial HEP provided at IE (10/13/2019);    Time 2    Period Weeks    Status New    Target Date 10/27/19             PT Long Term Goals - 10/13/19 1441      PT LONG TERM GOAL #1   Title Be independent with a long-term home exercise program for self-management of symptoms.    Baseline Initial HEP provided at IE (10/13/2019);    Time 12    Period Weeks    Status New    Target Date --   TARGET DATE FOR ALL LONG TERM GOALS: 01/05/2020     PT LONG TERM GOAL #2   Title Demonstrate improved FOTO score to equal or greater than 75 by visit #9 to demonstrate improvement in overall condition and self-reported functional ability.    Baseline 70 (10/13/2019);    Time 12    Period Weeks    Status New      PT LONG TERM GOAL #3   Title Reduce pain with functional activities to equal or less than 1/10 to allow patient to complete usual activities including ADLs, IADLs, and social engagement with less difficulty.    Baseline up to 3/10 (10/13/2019);    Time 12    Period Weeks    Status New      PT LONG TERM GOAL #4   Title Be able to demonstrate floor to waist lift using 25# box for 5 reps with proper form without limitation due to current condition in order to improve ability to lift during usual activities.    Baseline squat with form lacking, reports fear of lifting (10/13/2019);    Time 12    Period Weeks    Status New      PT LONG TERM GOAL #5   Title Complete community, work and/or recreational activities without limitation due to current condition.    Baseline Functional Limitations: difficulty bending, lifting, twisting, prolonged sitting, sleeping, yardwork, exercising, increases stress level (10/13/2019);    Time 12    Period Weeks    Status New                 Plan - 10/22/19 1918    Clinical Impression Statement Patient tolerated treatment well overall. Has difficulty staying on  task with many questions about multiple topics. Was able to improve his form and comfort level with the adductor rock back exercise and it was added to HEP. Patient appears to have many stressors in his life right now and little time to complete HEP. Recommend providing more structured exercise routine during next session and consider joint mobilizations to hips next session with focus on working on improving his  mobility and core activation patterns in the clinic. Patient would benefit from continued management of limiting condition by skilled physical therapist to address remaining impairments and functional limitations to work towards stated goals and return to PLOF or maximal functional independence.    Personal Factors and Comorbidities Age;Behavior Pattern;Comorbidity 3+;Sex;Past/Current Experience;Time since onset of injury/illness/exacerbation;Social Background    Comorbidities Relevant past medical history and comorbidities include major pulmonary embolism in 2020 (apparently recovered, no restrictions), hx DVT renal cysts (could not be removed), acid reflux, double hernia sx as a child, right side hernia sx as an adult, left leg fx/surgery, R elbow problems (cortisone shots), L lower leg comminuted fracture with ORIF (~20 yrs ago).    Examination-Activity Limitations Lift;Bend;Squat;Other;Carry   difficulty bending, lifting, twisting, prolonged sitting, sleeping, yardwork, exercising, increases stress level.   Examination-Participation Restrictions Yard Work;Community Activity;Driving;Interpersonal Relationship    Stability/Clinical Decision Making Evolving/Moderate complexity    Rehab Potential Good    PT Frequency 2x / week    PT Duration 12 weeks    PT Treatment/Interventions ADLs/Self Care Home Management;Biofeedback;Cryotherapy;Moist Heat;Traction;Therapeutic activities;Therapeutic exercise;Neuromuscular re-education;Patient/family education;Manual techniques;Passive range of motion;Dry  needling;Joint Manipulations;Spinal Manipulations    PT Next Visit Plan manual therapy, joint mobiliizations, funcitonal strengthening, educatoin    PT Home Exercise Plan Medbridge Access Code: [email or web address]    Consulted and Agree with Plan of Care Patient           Patient will benefit from skilled therapeutic intervention in order to improve the following deficits and impairments:  Pain, Improper body mechanics, Increased muscle spasms, Decreased activity tolerance, Decreased endurance, Decreased range of motion, Hypomobility, Impaired perceived functional ability, Impaired flexibility  Visit Diagnosis: Chronic bilateral low back pain, unspecified whether sciatica present  Pain in right hip  Pain in left hip  Pain in thoracic spine  Cervicalgia  Other muscle spasm     Problem List Patient Active Problem List   Diagnosis Date Noted  . Special screening for malignant neoplasms, colon   . Other pulmonary embolism with acute cor pulmonale ([place]) 02/11/2019  . GERD (gastroesophageal reflux disease) 02/02/2019  . Primary hypercoagulable state ([place]) 02/02/2019  . Renal cyst 02/02/2019  . At risk for secondary malignancy 02/02/2019  . Pulmonary embolism ([place]) 01/22/2019  . Daytime somnolence 01/19/2019  . SOB (shortness of breath) 01/19/2019  . Family history of ASCVD (arteriosclerotic cardiovascular disease) 07/17/2017  . Hyperlipemia, mixed 05/04/2014    Everlean Alstrom. Graylon Good, PT, DPT 10/22/19, 7:19 PM  [facility] [address], [place], [address] Phone: [phone]   Fax:  [phone]  Name: Nathan Scott MRN: [id number] Date of Birth: 1960-09-14

## 2019-10-27 ENCOUNTER — Other Ambulatory Visit: Payer: Self-pay

## 2019-10-27 ENCOUNTER — Ambulatory Visit: Payer: Commercial Managed Care - PPO | Admitting: Physical Therapy

## 2019-10-27 ENCOUNTER — Encounter: Payer: Commercial Managed Care - PPO | Admitting: Physical Therapy

## 2019-10-27 ENCOUNTER — Encounter: Payer: Self-pay | Admitting: Physical Therapy

## 2019-10-27 DIAGNOSIS — M546 Pain in thoracic spine: Secondary | ICD-10-CM

## 2019-10-27 DIAGNOSIS — M542 Cervicalgia: Secondary | ICD-10-CM

## 2019-10-27 DIAGNOSIS — M25552 Pain in left hip: Secondary | ICD-10-CM

## 2019-10-27 DIAGNOSIS — M62838 Other muscle spasm: Secondary | ICD-10-CM

## 2019-10-27 DIAGNOSIS — M545 Low back pain: Secondary | ICD-10-CM | POA: Diagnosis not present

## 2019-10-27 DIAGNOSIS — M25551 Pain in right hip: Secondary | ICD-10-CM

## 2019-10-27 DIAGNOSIS — G8929 Other chronic pain: Secondary | ICD-10-CM

## 2019-10-27 NOTE — Therapy (Signed)
[facility] [address] [place], [address] Phone: [phone]   Fax:  [phone]  Physical Therapy Treatment  Patient Details  Name: Nathan Scott MRN: [id number] Date of Birth: 12-03-1960 Referring Provider (PT): Rusty Aus, MD   Encounter Date: 10/27/2019   PT End of Session - 10/27/19 1523    Visit Number 4    Number of Visits 24    Date for PT Re-Evaluation 01/05/20    Authorization Type [place] reporting period from 10/13/2019    Authorization Time Period n/a    Authorization - Visit Number 4    Authorization - Number of Visits 10    Progress Note Due on Visit 10    PT Start Time 1515    PT Stop Time 1553    PT Time Calculation (min) 38 min    Activity Tolerance Patient tolerated treatment well    Behavior During Therapy [facility] for tasks assessed/performed           Past Medical History:  Diagnosis Date  . 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 GERD (gastroesophageal reflux disease)   . Pulmonary embolus ([place])   . Sleep apnea    no cpap x 8 years    Past Surgical History:  Procedure Laterality Date  . COLONOSCOPY WITH PROPOFOL N/A 02/24/2019   Procedure: COLONOSCOPY WITH PROPOFOL;  Surgeon: Lucilla Lame, MD;  Location: [facility] ENDOSCOPY;  Service: Endoscopy;  Laterality: N/A;  . FRACTURE SURGERY Left    pinning leg fx  . LEFT HEART CATH AND CORONARY ANGIOGRAPHY Left 01/12/2019   Procedure: LEFT HEART CATH AND CORONARY ANGIOGRAPHY;  Surgeon: Teodoro Spray, MD;  Location: [place] CV LAB;  Service: Cardiovascular;  Laterality: Left;  . NASAL SEPTOPLASTY W/ TURBINOPLASTY Bilateral 02/20/2016   Procedure: NASAL SEPTOPLASTY WITH TURBINATE REDUCTION;  Surgeon: Margaretha Sheffield, MD;  Location: [facility] ORS;  Service: ENT;  Laterality: Bilateral;  . PULMONARY THROMBECTOMY Bilateral 01/22/2019   Procedure: PULMONARY THROMBECTOMY;  Surgeon: Katha Cabal, MD;  Location: [place] [facility] LAB;   Service: Cardiovascular;  Laterality: Bilateral;    There were no vitals filed for this visit.   Subjective Assessment - 10/27/19 1521    Subjective Patient reports he is feeling well overall but is feeling a bit sore from hammering tile out of the bathroom. No specific pain anywhere. Has been doing his exercise class and tried a bit of his HEP but it is challening getting time to do everything. He felt okay following last session.    Pertinent History Patient is a 59 y.o. male who presents to outpatient physical therapy with a referral for medical diagnosis chronic low back pain. This patient's chief complaints consist of chronic low/mid back, neck pain and pain in the B hip regions leading to the following functional deficits: difficulty bending, lifting, twisting, prolonged sitting, sleeping, yardwork, exercising, increases stress level. Relevant past medical history and comorbidities include major pulmonary embolism in 2020 (apparently recovered, no restrictions), hx DVT renal cysts (could not be removed), acid reflux, double hernia sx as a child, right side hernia sx as an adult, left leg fx/surgery, R elbow problems (cortisone shots), L lower leg comminuted fracture with ORIF (~20 yrs ago).  Patient denies hx of cancer, stroke, seizures, lung problem (except PE), major cardiac events (besides DVT, clotting), diabetes, unexplained weight loss, stumbling, dropping things, spinal surgery, major spinal trauma.    Limitations Sitting;Lifting;Standing;House hold activities;Other (comment)  difficulty bending, lifting, twisting, prolonged sitting, sleeping, yardwork, exercising, increases stress level.   Diagnostic tests Abdominal CT 02/02/2019" Musculoskeletal: No blastic or lytic bone lesions. There are foci ofdegenerative change in the thoracic spine. There are nointramuscular lesions. No abdominal wall lesions appreciable. IMPRESSION:1. There is a cyst arising from the medial mid right kidneymeasuring  9.3 x 7.6 x 7.3 cm. This cyst extends medially to a levelimmediately adjacent to the inferior vena cava. There is slightlocalized narrowing of the inferior vena cava at the site of thecyst without direct impression of the cyst upon the inferior venacava with supine positioning. There is no obstruction of theinferior vena cava in this area. No other large mass evident in theabdomen or pelvis. There are small renal cysts on each side. 2. There are multiple sigmoid and descending colonic diverticulawithout diverticulitis. No bowel obstruction. No abscess in theabdomen or pelvis. Appendix appears normal. 3.  Small hiatal hernia. 4. No renal or ureteral calculi. No hydronephrosis. Urinary bladderwall thickness normal. 5.  Aortic Atherosclerosis (ICD10-I70.0). 6.  Hepatic steatosis.    Patient Stated Goals get healthier    Currently in Pain? No/denies    Pain Onset 1 to 4 weeks ago           TREATMENT:  Therapeutic exercise: to centralize symptoms and improve ROM, strength, muscular endurance, and activity tolerance required for successful completion of functional activities.  - Treadmill at 3.41mph at 0% grade with BUE support. For improved lower extremity mobility, muscular endurance, and weightbearing activity tolerance; and to induce the analgesic effect of aerobic exercise, stimulate improved joint nutrition, and prepare body structures and systems for following interventions. x 5.5 minutes. During subjective exam and education on relaxation techniques.  - quadruped rock backs x20 with feet together and then apart to bias IR  - adductor rock back x 10 each side with cuing on how to perform correctly.  - patient showed some new exercises (3 poses) exercise instructor had him perform on Saturday that were difficult. Showed patient how to use yoga blocks to improve control to achieve better stretches with these.   Manual therapy: to reduce pain and tissue tension, improve range of motion, neuromodulation,  in order to promote improved ability to complete functional activities. - supine hip joint mobilizations grade III-IV with mob belt: R 3x30 seconds caudal. Attempted R side lateral but increased low back pain.  R3x30 seconds caudal and 5x10 seconds posterolateral.  - MDT repeated lumbar extension with patient overpressure (lock and sag) in lying x 10. Patient with significantly improved form.    HOME EXERCISE PROGRAM  Access Code: [email or web address]  URL: [email or web address]  Date: 10/13/2019  Prepared by: Rosita Kea  Exercises  Prone Press Up - 4 x daily - 10-15 reps - 1 second hold  Standing Lumbar Extension - 4 x daily - 10-15 reps - 1 sets - 1 second hold        PT Education - 10/27/19 1523    Education provided Yes    Education Details Exercise purpose/form. Self management techniques    Person(s) Educated Patient    Methods Explanation;Demonstration;Tactile cues;Verbal cues    Comprehension Verbalized understanding;Returned demonstration;Verbal cues required;Tactile cues required;Need further instruction            PT Short Term Goals - 10/13/19 1440      PT SHORT TERM GOAL #1   Title Be independent with initial home exercise program for self-management of symptoms.    Baseline Initial HEP provided at IE (10/13/2019);  Time 2    Period Weeks    Status New    Target Date 10/27/19             PT Long Term Goals - 10/13/19 1441      PT LONG TERM GOAL #1   Title Be independent with a long-term home exercise program for self-management of symptoms.    Baseline Initial HEP provided at IE (10/13/2019);    Time 12    Period Weeks    Status New    Target Date --   TARGET DATE FOR ALL LONG TERM GOALS: 01/05/2020     PT LONG TERM GOAL #2   Title Demonstrate improved FOTO score to equal or greater than 75 by visit #9 to demonstrate improvement in overall condition and self-reported functional ability.    Baseline 70 (10/13/2019);    Time 12    Period Weeks     Status New      PT LONG TERM GOAL #3   Title Reduce pain with functional activities to equal or less than 1/10 to allow patient to complete usual activities including ADLs, IADLs, and social engagement with less difficulty.    Baseline up to 3/10 (10/13/2019);    Time 12    Period Weeks    Status New      PT LONG TERM GOAL #4   Title Be able to demonstrate floor to waist lift using 25# box for 5 reps with proper form without limitation due to current condition in order to improve ability to lift during usual activities.    Baseline squat with form lacking, reports fear of lifting (10/13/2019);    Time 12    Period Weeks    Status New      PT LONG TERM GOAL #5   Title Complete community, work and/or recreational activities without limitation due to current condition.    Baseline Functional Limitations: difficulty bending, lifting, twisting, prolonged sitting, sleeping, yardwork, exercising, increases stress level (10/13/2019);    Time 12    Period Weeks    Status New                 Plan - 10/27/19 1613    Clinical Impression Statement Patient tolerated treatment well overall but stated he felt tired by end of session. Did feel he had a bit more range with adductor rock back but also noted he wore stretchy pants instead of jeans today that may have improved his ability to reach end range hip motion. Demo improved ability to relax and perform lumbar extension exercise this session. Continues to be limited by time constraints. May benefit from PA joint mobilizations to the spine next session. Patient would benefit from continued management of limiting condition by skilled physical therapist to address remaining impairments and functional limitations to work towards stated goals and return to PLOF or maximal functional independence.    Personal Factors and Comorbidities Age;Behavior Pattern;Comorbidity 3+;Sex;Past/Current Experience;Time since onset of injury/illness/exacerbation;Social  Background    Comorbidities Relevant past medical history and comorbidities include major pulmonary embolism in 2020 (apparently recovered, no restrictions), hx DVT renal cysts (could not be removed), acid reflux, double hernia sx as a child, right side hernia sx as an adult, left leg fx/surgery, R elbow problems (cortisone shots), L lower leg comminuted fracture with ORIF (~20 yrs ago).    Examination-Activity Limitations Lift;Bend;Squat;Other;Carry   difficulty bending, lifting, twisting, prolonged sitting, sleeping, yardwork, exercising, increases stress level.   Examination-Participation Restrictions Yard Work;Community Activity;Driving;Interpersonal Relationship  Stability/Clinical Decision Making Evolving/Moderate complexity    Rehab Potential Good    PT Frequency 2x / week    PT Duration 12 weeks    PT Treatment/Interventions ADLs/Self Care Home Management;Biofeedback;Cryotherapy;Moist Heat;Traction;Therapeutic activities;Therapeutic exercise;Neuromuscular re-education;Patient/family education;Manual techniques;Passive range of motion;Dry needling;Joint Manipulations;Spinal Manipulations    PT Next Visit Plan manual therapy, joint mobiliizations, funcitonal strengthening, educatoin    PT Home Exercise Plan Medbridge Access Code: [email or web address]    Consulted and Agree with Plan of Care Patient           Patient will benefit from skilled therapeutic intervention in order to improve the following deficits and impairments:  Pain, Improper body mechanics, Increased muscle spasms, Decreased activity tolerance, Decreased endurance, Decreased range of motion, Hypomobility, Impaired perceived functional ability, Impaired flexibility  Visit Diagnosis: Chronic bilateral low back pain, unspecified whether sciatica present  Pain in right hip  Pain in left hip  Pain in thoracic spine  Cervicalgia  Other muscle spasm     Problem List Patient Active Problem List   Diagnosis Date Noted  .  Special screening for malignant neoplasms, colon   . Other pulmonary embolism with acute cor pulmonale ([place]) 02/11/2019  . GERD (gastroesophageal reflux disease) 02/02/2019  . Primary hypercoagulable state ([place]) 02/02/2019  . Renal cyst 02/02/2019  . At risk for secondary malignancy 02/02/2019  . Pulmonary embolism ([place]) 01/22/2019  . Daytime somnolence 01/19/2019  . SOB (shortness of breath) 01/19/2019  . Family history of ASCVD (arteriosclerotic cardiovascular disease) 07/17/2017  . Hyperlipemia, mixed 05/04/2014    Everlean Alstrom. Graylon Good, PT, DPT 10/27/19, 4:15 PM  [facility] [address] [place], [address] Phone: [phone]   Fax:  [phone]  Name: Nathan Scott MRN: [id number] Date of Birth: 08/05/1960

## 2019-10-31 NOTE — Progress Notes (Signed)
[facility]  [phone] [phone]  ID: Blair Promise OB: 1961/01/16  MR#: [id number]  ZCH#:[id number]  Patient Care Team: Rusty Aus, MD as PCP - General (Internal Medicine)  I connected with Blair Promise on 11/06/19 at  2:45 PM EDT by video enabled telemedicine visit and verified that I am speaking with the correct person using two identifiers.   I discussed the limitations, risks, security and privacy concerns of performing an evaluation and management service by telemedicine and the availability of in-person appointments. I also discussed with the patient that there may be a patient responsible charge related to this service. The patient expressed understanding and agreed to proceed.   Other persons participating in the visit and their role in the encounter: Patient, MD.  Patient's location: Home. Provider's location: Clinic.  CHIEF COMPLAINT: Pulmonary embolism, DVT. Renal cysts.  INTERVAL HISTORY: Patient agreed to video enabled telemedicine visit for further evaluation and discussion of his CT results. Visit was switch to telephone secondary to technical difficulties.  He currently feels well and is asymptomatic.  He has no neurologic complaints.  He denies any recent fevers or illnesses.  He has a good appetite and denies weight loss.  He has no chest pain, shortness of breath, cough, or hemoptysis.  He denies any nausea, vomiting, constipation, or diarrhea.  He has no urinary complaints. Patient offers no specific complaints today.  REVIEW OF SYSTEMS:   Review of Systems  Constitutional: Negative.  Negative for fever, malaise/fatigue and weight loss.  Respiratory: Negative.  Negative for cough, hemoptysis and shortness of breath.   Cardiovascular: Negative.  Negative for chest pain and leg swelling.  Gastrointestinal: Negative.  Negative for abdominal pain, blood in stool and melena.  Genitourinary: Negative.  Negative for dysuria.    Musculoskeletal: Negative.  Negative for back pain.  Skin: Negative.  Negative for rash.  Neurological: Negative.  Negative for dizziness, focal weakness, weakness and headaches.  Psychiatric/Behavioral: Negative.  The patient is not nervous/anxious.     As per HPI. Otherwise, a complete review of systems is negative.  PAST MEDICAL HISTORY: Past Medical History:  Diagnosis Date  . 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 GERD (gastroesophageal reflux disease)   . Hypertension   . Pulmonary embolus ([place])   . Sleep apnea    no cpap x 8 years    PAST SURGICAL HISTORY: Past Surgical History:  Procedure Laterality Date  . COLONOSCOPY WITH PROPOFOL N/A 02/24/2019   Procedure: COLONOSCOPY WITH PROPOFOL;  Surgeon: Lucilla Lame, MD;  Location: [facility] ENDOSCOPY;  Service: Endoscopy;  Laterality: N/A;  . FRACTURE SURGERY Left    pinning leg fx  . LEFT HEART CATH AND CORONARY ANGIOGRAPHY Left 01/12/2019   Procedure: LEFT HEART CATH AND CORONARY ANGIOGRAPHY;  Surgeon: Teodoro Spray, MD;  Location: [place] [facility] LAB;  Service: Cardiovascular;  Laterality: Left;  . NASAL SEPTOPLASTY W/ TURBINOPLASTY Bilateral 02/20/2016   Procedure: NASAL SEPTOPLASTY WITH TURBINATE REDUCTION;  Surgeon: Margaretha Sheffield, MD;  Location: [facility] ORS;  Service: ENT;  Laterality: Bilateral;  . PULMONARY THROMBECTOMY Bilateral 01/22/2019   Procedure: PULMONARY THROMBECTOMY;  Surgeon: Katha Cabal, MD;  Location: [place] [facility] LAB;  Service: Cardiovascular;  Laterality: Bilateral;    FAMILY HISTORY: Family History  Problem Relation Age of Onset  . Heart attack Father   . Clotting disorder Paternal Grandmother     ADVANCED DIRECTIVES (Y/N):  N  HEALTH MAINTENANCE: Social History   Tobacco Use  .  Smoking status: Never Smoker  . Smokeless tobacco: Never Used  Vaping Use  . Vaping Use: Never used  Substance Use Topics  . Alcohol use: No  . Drug use: No     Colonoscopy:  PAP:  Bone density:  Lipid  panel:  No Known Allergies  Current Outpatient Medications  Medication Sig Dispense Refill  . ALPRAZolam (XANAX) 0.5 MG tablet Take 0.5 mg by mouth at bedtime as needed for sleep.     Marland Kitchen apixaban (ELIQUIS) 5 MG TABS tablet Take 1 tablet (5 mg total) by mouth 2 (two) times daily. 60 tablet 11  . APPLE CIDER VINEGAR PO Take 3 capsules by mouth daily.     Marland Kitchen aspirin EC 81 MG tablet Take 81 mg by mouth daily.    . cholecalciferol (VITAMIN D) 25 MCG (1000 UT) tablet Take 1,000 Units by mouth daily.    . Chromium Picolinate 800 MCG TABS Take by mouth.    . cyclobenzaprine (FLEXERIL) 10 MG tablet Take 10 mg by mouth daily as needed for muscle spasms.     . Flaxseed, Linseed, (FLAXSEED OIL) 1200 MG CAPS Take 2,400 mg by mouth daily.    Marland Kitchen glucosamine-chondroitin 500-400 MG tablet Take 1 tablet by mouth daily.     . hydrochlorothiazide (HYDRODIURIL) 25 MG tablet Take 25 mg by mouth daily.     . Magnesium Oxide 500 MG TABS Take 500 mg by mouth daily.    . Omega-3 Fatty Acids (FISH OIL) 1000 MG CAPS Take 2,000 mg by mouth daily.    Marland Kitchen omeprazole (PRILOSEC) 40 MG capsule Take 40 mg by mouth daily before breakfast.    . pyridOXINE (VITAMIN B-6) 100 MG tablet Take 100 mg by mouth daily.    Marland Kitchen rOPINIRole (REQUIP XL) 2 MG 24 hr tablet Take 2 mg by mouth at bedtime.    . sertraline (ZOLOFT) 50 MG tablet Take 50 mg by mouth at bedtime.    . simvastatin (ZOCOR) 20 MG tablet Take 20 mg by mouth at bedtime.    . triamcinolone (NASACORT ALLERGY 24HR) 55 MCG/ACT AERO nasal inhaler Place 2 sprays into the nose daily.    Marland Kitchen triamcinolone cream (KENALOG) 0.1 % Apply 1 application topically 2 (two) times daily as needed (for itching legs.).    Marland Kitchen Turmeric (QC TUMERIC COMPLEX PO) Take 1,000 mg by mouth.     No current facility-administered medications for this visit.    OBJECTIVE: There were no vitals filed for this visit.   There is no height or weight on file to calculate BMI.    ECOG FS:0 - Asymptomatic  General:  Well-developed, well-nourished, no acute distress. HEENT: Normocephalic. Neuro: Alert, answering all questions appropriately. Cranial nerves grossly intact. Psych: Normal affect.   LAB RESULTS:  Lab Results  Component Value Date   NA 136 01/23/2019   K 3.9 01/23/2019   CL 106 01/23/2019   CO2 25 01/23/2019   GLUCOSE 114 (H) 01/23/2019   BUN 25 (H) 01/23/2019   CREATININE 0.90 11/02/2019   CALCIUM 8.0 (L) 01/23/2019   GFRNONAA >60 01/23/2019   GFRAA >60 01/23/2019    Lab Results  Component Value Date   WBC 6.7 01/23/2019   NEUTROABS 5.1 01/22/2019   HGB 11.6 (L) 01/23/2019   HCT 34.0 (L) 01/23/2019   MCV 87.2 01/23/2019   PLT 126 (L) 01/23/2019     STUDIES: CT Abdomen Pelvis W Contrast  Result Date: 11/02/2019 CLINICAL DATA:  Renal lesion. EXAM: CT ABDOMEN AND PELVIS  WITH CONTRAST TECHNIQUE: Multidetector CT imaging of the abdomen and pelvis was performed using the standard protocol following bolus administration of intravenous contrast. CONTRAST:  151mL OMNIPAQUE IOHEXOL 300 MG/ML  SOLN COMPARISON:  MRI abdomen 02/04/2019 and CT abdomen pelvis 02/02/2019. FINDINGS: Lower chest: Lung bases are clear. Heart size normal. No pericardial or pleural effusion. Distal esophagus is unremarkable. Hepatobiliary: Liver is slightly decreased in attenuation diffusely. Liver and gallbladder are otherwise unremarkable. No biliary ductal dilatation. Pancreas: Negative. Spleen: Negative. Adrenals/Urinary Tract: Adrenal glands are unremarkable. Low-attenuation lesions in the kidneys measure up to 3.9 x 5.6 cm on the right and are likely cysts. The largest measured lesion on the right has decreased in size from 7.6 x 9.4 cm on 02/02/2019. Ureters are decompressed. Bladder is grossly unremarkable. Stomach/Bowel: Tiny hiatal hernia. Stomach, small bowel, appendix and colon are otherwise unremarkable. Vascular/Lymphatic: Atherosclerotic calcification of the aorta without aneurysm. Circumaortic left  renal vein. No pathologically enlarged lymph nodes. Reproductive: Prostate is visualized. Other: No free fluid.  Mesenteries and peritoneum are unremarkable. Musculoskeletal: Degenerative changes in the spine. No worrisome lytic or sclerotic lesions. IMPRESSION: 1. Low-attenuation renal lesions, characterized as simple and minimally complex cysts on 02/04/2019. Largest cyst on the right has decreased in size in the interval. 2. Hepatic steatosis. 3.  Aortic atherosclerosis (ICD10-I70.0). Electronically Signed   By: Lorin Picket M.D.   On: 11/02/2019 14:56    ASSESSMENT: Pulmonary embolism, DVT  PLAN:    1. Pulmonary embolism, DVT: Patient appears to have no transient risk factors.  Imaging results previously reviewed independently with no obvious underlying malignancy.  Patient underwent thrombectomy with thrombolysis with significant improvement of his symptoms.  Full hypercoagulable work-up was negative.  Patient will continue Eliquis for a minimum of 1 year. Given the size of the PE and that fact that he was noted to have right heart strain, he is hesitant to discontinue anticoagulation. Plan is to transition to a prophylactic dose of 2.5 mg BID after the year of treatment dosing.   2.  Renal cyst: Benign.  Biopsy and aspiration at [facility] on August 05, 2019 was inconclusive.  Repeat CT scan on November 02, 2019 reviewed independently and reported as above.  Continue with simple observation.  Repeat CT in 1 year to ensure stability. Patient will have a video assisted telemedicine visit 1-2 days later for further evaluation and discussion of the results. If everything remains stable,he likely can be discharged from clinic. 3.  Hematuria: Resolved.  I provided 20 minutes of face-to-face video visit time during this encounter which included chart review, counseling, and coordination of care as documented above.   Patient expressed understanding and was in agreement with this plan. He also  understands that He can call clinic at any time with any questions, concerns, or complaints.    Lloyd Huger, MD   11/06/2019 8:52 AM

## 2019-11-02 ENCOUNTER — Ambulatory Visit
Admission: RE | Admit: 2019-11-02 | Discharge: 2019-11-02 | Disposition: A | Discharge status: Home / Self Care | Payer: Commercial Managed Care - PPO | Source: Ambulatory Visit | Attending: Oncology | Admitting: Oncology

## 2019-11-02 ENCOUNTER — Other Ambulatory Visit: Payer: Self-pay

## 2019-11-02 DIAGNOSIS — N281 Cyst of kidney, acquired: Secondary | ICD-10-CM | POA: Insufficient documentation

## 2019-11-02 HISTORY — DX: Essential (primary) hypertension: I10

## 2019-11-02 LAB — POCT I-STAT CREATININE: Creatinine, Ser: 0.9 mg/dL (ref 0.61–1.24)

## 2019-11-02 MED ORDER — IOHEXOL 300 MG/ML  SOLN
100.0000 mL | Freq: Once | INTRAMUSCULAR | Status: AC | PRN
  Administered 2019-11-02: 100 mL via INTRAVENOUS

## 2019-11-03 ENCOUNTER — Inpatient Hospital Stay: Payer: Commercial Managed Care - PPO | Attending: Oncology | Admitting: Oncology

## 2019-11-03 ENCOUNTER — Encounter (INDEPENDENT_AMBULATORY_CARE_PROVIDER_SITE_OTHER): Payer: Self-pay

## 2019-11-03 ENCOUNTER — Encounter: Payer: Self-pay | Admitting: Oncology

## 2019-11-03 DIAGNOSIS — I2602 Saddle embolus of pulmonary artery with acute cor pulmonale: Secondary | ICD-10-CM

## 2019-11-03 DIAGNOSIS — N281 Cyst of kidney, acquired: Secondary | ICD-10-CM | POA: Diagnosis not present

## 2019-11-03 NOTE — Progress Notes (Signed)
Patient would like clarification on hiatal hernia and what should be done about that. He would also like to discuss cyst. Denies other concerns or pain at this time.

## 2019-11-04 ENCOUNTER — Other Ambulatory Visit: Payer: Self-pay

## 2019-11-04 ENCOUNTER — Ambulatory Visit: Payer: Commercial Managed Care - PPO | Admitting: Physical Therapy

## 2019-11-04 ENCOUNTER — Encounter: Payer: Self-pay | Admitting: Physical Therapy

## 2019-11-04 DIAGNOSIS — M546 Pain in thoracic spine: Secondary | ICD-10-CM

## 2019-11-04 DIAGNOSIS — M542 Cervicalgia: Secondary | ICD-10-CM

## 2019-11-04 DIAGNOSIS — G8929 Other chronic pain: Secondary | ICD-10-CM

## 2019-11-04 DIAGNOSIS — M62838 Other muscle spasm: Secondary | ICD-10-CM

## 2019-11-04 DIAGNOSIS — M25551 Pain in right hip: Secondary | ICD-10-CM

## 2019-11-04 DIAGNOSIS — M545 Low back pain, unspecified: Secondary | ICD-10-CM

## 2019-11-04 DIAGNOSIS — M25552 Pain in left hip: Secondary | ICD-10-CM

## 2019-11-04 NOTE — Therapy (Signed)
[facility] [facility] [address] [place], [address] Phone: [phone]   Fax:  [phone]  Physical Therapy Treatment  Patient Details  Name: Nathan Scott MRN: [id number] Date of Birth: 10-Feb-1961 Referring Provider (PT): Rusty Aus, MD   Encounter Date: 11/04/2019   PT End of Session - 11/04/19 1706    Visit Number 5    Number of Visits 24    Date for PT Re-Evaluation 01/05/20    Authorization Type [place] reporting period from 10/13/2019    Authorization Time Period n/a    Authorization - Visit Number 5    Authorization - Number of Visits 10    Progress Note Due on Visit 10    PT Start Time 1702    PT Stop Time 1759    PT Time Calculation (min) 57 min    Activity Tolerance Patient tolerated treatment well    Behavior During Therapy [facility] for tasks assessed/performed           Past Medical History:  Diagnosis Date  . 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 GERD (gastroesophageal reflux disease)   . Hypertension   . Pulmonary embolus ([place])   . Sleep apnea    no cpap x 8 years    Past Surgical History:  Procedure Laterality Date  . COLONOSCOPY WITH PROPOFOL N/A 02/24/2019   Procedure: COLONOSCOPY WITH PROPOFOL;  Surgeon: Lucilla Lame, MD;  Location: [facility] ENDOSCOPY;  Service: Endoscopy;  Laterality: N/A;  . FRACTURE SURGERY Left    pinning leg fx  . LEFT HEART CATH AND CORONARY ANGIOGRAPHY Left 01/12/2019   Procedure: LEFT HEART CATH AND CORONARY ANGIOGRAPHY;  Surgeon: Teodoro Spray, MD;  Location: [facility];  Service: Cardiovascular;  Laterality: Left;  . NASAL SEPTOPLASTY W/ TURBINOPLASTY Bilateral 02/20/2016   Procedure: NASAL SEPTOPLASTY WITH TURBINATE REDUCTION;  Surgeon: Margaretha Sheffield, MD;  Location: [facility] ORS;  Service: ENT;  Laterality: Bilateral;  . PULMONARY THROMBECTOMY Bilateral 01/22/2019   Procedure: PULMONARY THROMBECTOMY;  Surgeon: Katha Cabal, MD;  Location: [place] [facility];  Service: Cardiovascular;  Laterality: Bilateral;    There were no vitals filed for this visit.   Subjective Assessment - 11/04/19 1701    Subjective Patient reports he was pretty sore on Monday. He feels okay right now. He thinks he twists his R knee when he knees down on his R knee. He took it easy over the weekend because he was limping. He rates his pain 1-2/10 at the low back upon arrival. He wakes up in the morning and back is sore. He thinks this is partly due to his wife lifting the foot of the bed a bit. L hip was twitching and feeling like it would give out over the weekend. He thinks he overdid it saturday with his exercise group with the new dragon stretch.    Pertinent History Patient is a 59 y.o. male who presents to outpatient physical therapy with a referral for medical diagnosis chronic low back pain. This patient's chief complaints consist of chronic low/mid back, neck pain and pain in the B hip regions leading to the following functional deficits: difficulty bending, lifting, twisting, prolonged sitting, sleeping, yardwork, exercising, increases stress level. Relevant past medical history and comorbidities include major pulmonary embolism in 2020 (apparently recovered, no restrictions), hx DVT renal cysts (could not be removed), acid reflux, double hernia sx as a child, right side hernia sx as an adult,  left leg fx/surgery, R elbow problems (cortisone shots), L lower leg comminuted fracture with ORIF (~20 yrs ago).  Patient denies hx of cancer, stroke, seizures, lung problem (except PE), major cardiac events (besides DVT, clotting), diabetes, unexplained weight loss, stumbling, dropping things, spinal surgery, major spinal trauma.    Limitations Sitting;Lifting;Standing;House hold activities;Other (comment)   difficulty bending, lifting, twisting, prolonged sitting, sleeping, yardwork, exercising, increases stress level.   Diagnostic tests Abdominal CT 02/02/2019"  Musculoskeletal: No blastic or lytic bone lesions. There are foci ofdegenerative change in the thoracic spine. There are nointramuscular lesions. No abdominal wall lesions appreciable. IMPRESSION:1. There is a cyst arising from the medial mid right kidneymeasuring 9.3 x 7.6 x 7.3 cm. This cyst extends medially to a levelimmediately adjacent to the inferior vena cava. There is slightlocalized narrowing of the inferior vena cava at the site of thecyst without direct impression of the cyst upon the inferior venacava with supine positioning. There is no obstruction of theinferior vena cava in this area. No other large mass evident in theabdomen or pelvis. There are small renal cysts on each side. 2. There are multiple sigmoid and descending colonic diverticulawithout diverticulitis. No bowel obstruction. No abscess in theabdomen or pelvis. Appendix appears normal. 3.  Small hiatal hernia. 4. No renal or ureteral calculi. No hydronephrosis. Urinary bladderwall thickness normal. 5.  Aortic Atherosclerosis (ICD10-I70.0). 6.  Hepatic steatosis.    Patient Stated Goals get healthier    Currently in Pain? Yes    Pain Score 2     Pain Location Back    Pain Onset 1 to 4 weeks ago             TREATMENT:  Therapeutic exercise: to centralize symptoms and improve ROM, strength, muscular endurance, and activity tolerance required for successful completion of functional activities.  - Treadmill at 3.6 mph at 0% grade with BUE support. For improved lower extremity mobility, muscular endurance, and weightbearing activity tolerance; and to induce the analgesic effect of aerobic exercise, stimulate improved joint nutrition, and prepare body structures and systems for following interventions. x 5 minutes. During subjective exam and education on relaxation techniques.  - supine chin tuck x 20 with cuing for improved form.  (manual therapy) - seated adductor stretch in 17 inch chair, 2x10  - active hip flexion into pancake  position form 17 inch chair focusing on active control of full range at hips. Patient unable to feel leg contraction when flexing forward even with external cuing to decrease motion at the spine. Repeated x 10 each on 8 inch stool and 12 inch stool with added 6# DB held in both hands to increase intensity of stretch. For improved adductor ROM.  - quadruped rock backs x3 with feet together (comfortable) and then apart to bias IR x2 (uncomfortable).  - standing adductor slide with one knee on rolling stool, x10 to max ROM. Very difficult with BUE support.  - discussed avoiding exercises that aggravate R knee for now if not always. Discussed alternatives to rock backs.   Manual therapy: to reduce pain and tissue tension, improve range of motion, neuromodulation, in order to promote improved ability to complete functional activities. - supine hip joint mobilizations grade III-IV with mob belt: R and L 3x30 seconds caudal. - supine PROM hamstring stretch with contract-release technique. Both sides.   - supine adductor stretch 4x10 seconds each side   HOME EXERCISE PROGRAM  Access Code: [email or web address]  URL: [email or web address]  Date: 10/13/2019  Prepared by: Rosita Kea  Exercises  Prone Press Up - 4 x daily - 10-15 reps - 1 second hold  Standing Lumbar Extension - 4 x daily - 10-15 reps - 1 sets - 1 second hold     PT Education - 11/04/19 1705    Education provided Yes    Education Details Exercise purpose/form. Self management techniques    Person(s) Educated Patient    Methods Explanation;Demonstration;Tactile cues;Verbal cues    Comprehension Verbalized understanding;Returned demonstration;Verbal cues required;Tactile cues required;Need further instruction            PT Short Term Goals - 10/13/19 1440      PT SHORT TERM GOAL #1   Title Be independent with initial home exercise program for self-management of symptoms.    Baseline Initial HEP provided at IE (10/13/2019);     Time 2    Period Weeks    Status New    Target Date 10/27/19             PT Long Term Goals - 10/13/19 1441      PT LONG TERM GOAL #1   Title Be independent with a long-term home exercise program for self-management of symptoms.    Baseline Initial HEP provided at IE (10/13/2019);    Time 12    Period Weeks    Status New    Target Date --   TARGET DATE FOR ALL LONG TERM GOALS: 01/05/2020     PT LONG TERM GOAL #2   Title Demonstrate improved FOTO score to equal or greater than 75 by visit #9 to demonstrate improvement in overall condition and self-reported functional ability.    Baseline 70 (10/13/2019);    Time 12    Period Weeks    Status New      PT LONG TERM GOAL #3   Title Reduce pain with functional activities to equal or less than 1/10 to allow patient to complete usual activities including ADLs, IADLs, and social engagement with less difficulty.    Baseline up to 3/10 (10/13/2019);    Time 12    Period Weeks    Status New      PT LONG TERM GOAL #4   Title Be able to demonstrate floor to waist lift using 25# box for 5 reps with proper form without limitation due to current condition in order to improve ability to lift during usual activities.    Baseline squat with form lacking, reports fear of lifting (10/13/2019);    Time 12    Period Weeks    Status New      PT LONG TERM GOAL #5   Title Complete community, work and/or recreational activities without limitation due to current condition.    Baseline Functional Limitations: difficulty bending, lifting, twisting, prolonged sitting, sleeping, yardwork, exercising, increases stress level (10/13/2019);    Time 12    Period Weeks    Status New                 Plan - 11/04/19 1916    Clinical Impression Statement Patient tolerated treatment well overall and continues to have significant limitations in hip flexion/abduction positions. Patient feels like his efforts for HEP have been helping improve this some.  Introduced full AROM exercises to improve mobility safely. Patient requires frequent redirection to stay on task. Patient would benefit from continued management of limiting condition by skilled physical therapist to address remaining impairments and functional limitations to work towards stated goals and return to PLOF or maximal functional independence.  Personal Factors and Comorbidities Age;Behavior Pattern;Comorbidity 3+;Sex;Past/Current Experience;Time since onset of injury/illness/exacerbation;Social Background    Comorbidities Relevant past medical history and comorbidities include major pulmonary embolism in 2020 (apparently recovered, no restrictions), hx DVT renal cysts (could not be removed), acid reflux, double hernia sx as a child, right side hernia sx as an adult, left leg fx/surgery, R elbow problems (cortisone shots), L lower leg comminuted fracture with ORIF (~20 yrs ago).    Examination-Activity Limitations Lift;Bend;Squat;Other;Carry   difficulty bending, lifting, twisting, prolonged sitting, sleeping, yardwork, exercising, increases stress level.   Examination-Participation Restrictions Yard Work;Community Activity;Driving;Interpersonal Relationship    Stability/Clinical Decision Making Evolving/Moderate complexity    Rehab Potential Good    PT Frequency 2x / week    PT Duration 12 weeks    PT Treatment/Interventions ADLs/Self Care Home Management;Biofeedback;Cryotherapy;Moist Heat;Traction;Therapeutic activities;Therapeutic exercise;Neuromuscular re-education;Patient/family education;Manual techniques;Passive range of motion;Dry needling;Joint Manipulations;Spinal Manipulations    PT Next Visit Plan manual therapy, joint mobiliizations, funcitonal strengthening, educatoin    PT Home Exercise Plan Medbridge Access Code: [email or web address]    Consulted and Agree with Plan of Care Patient           Patient will benefit from skilled therapeutic intervention in order to improve the  following deficits and impairments:  Pain, Improper body mechanics, Increased muscle spasms, Decreased activity tolerance, Decreased endurance, Decreased range of motion, Hypomobility, Impaired perceived functional ability, Impaired flexibility  Visit Diagnosis: Chronic bilateral low back pain, unspecified whether sciatica present  Pain in right hip  Pain in left hip  Pain in thoracic spine  Cervicalgia  Other muscle spasm     Problem List Patient Active Problem List   Diagnosis Date Noted  . Special screening for malignant neoplasms, colon   . Other pulmonary embolism with acute cor pulmonale ([place]) 02/11/2019  . GERD (gastroesophageal reflux disease) 02/02/2019  . Primary hypercoagulable state ([place]) 02/02/2019  . Renal cyst 02/02/2019  . At risk for secondary malignancy 02/02/2019  . Pulmonary embolism ([place]) 01/22/2019  . Daytime somnolence 01/19/2019  . SOB (shortness of breath) 01/19/2019  . Family history of ASCVD (arteriosclerotic cardiovascular disease) 07/17/2017  . Hyperlipemia, mixed 05/04/2014    Everlean Alstrom. Graylon Good, PT, DPT 11/04/19, 7:18 PM  [place] [address] [place], [address] Phone: [phone]   Fax:  [phone]  Name: Nathan Scott MRN: [id number] Date of Birth: 1961/02/08

## 2019-11-10 ENCOUNTER — Encounter: Payer: Commercial Managed Care - PPO | Admitting: Physical Therapy

## 2019-11-11 ENCOUNTER — Encounter: Payer: Commercial Managed Care - PPO | Admitting: Physical Therapy

## 2019-11-12 ENCOUNTER — Ambulatory Visit: Payer: Commercial Managed Care - PPO | Admitting: Physical Therapy

## 2019-11-12 ENCOUNTER — Other Ambulatory Visit: Payer: Self-pay

## 2019-11-12 ENCOUNTER — Encounter: Payer: Self-pay | Admitting: Physical Therapy

## 2019-11-12 DIAGNOSIS — M25552 Pain in left hip: Secondary | ICD-10-CM

## 2019-11-12 DIAGNOSIS — G8929 Other chronic pain: Secondary | ICD-10-CM

## 2019-11-12 DIAGNOSIS — M545 Low back pain: Secondary | ICD-10-CM | POA: Diagnosis not present

## 2019-11-12 DIAGNOSIS — M62838 Other muscle spasm: Secondary | ICD-10-CM

## 2019-11-12 DIAGNOSIS — M25551 Pain in right hip: Secondary | ICD-10-CM

## 2019-11-12 DIAGNOSIS — M546 Pain in thoracic spine: Secondary | ICD-10-CM

## 2019-11-12 DIAGNOSIS — M542 Cervicalgia: Secondary | ICD-10-CM

## 2019-11-12 NOTE — Therapy (Signed)
[facility] [address] [place], [address] Phone: [phone]   Fax:  [phone]  Physical Therapy Treatment  Patient Details  Name: Nathan Scott MRN: [id number] Date of Birth: [birth year] Referring Provider (PT): Rusty Aus, MD   Encounter Date: 11/12/2019   PT End of Session - 11/12/19 1655    Visit Number 6    Number of Visits 24    Date for PT Re-Evaluation 01/05/20    Authorization Type [place] reporting period from 10/13/2019    Authorization Time Period n/a    Authorization - Visit Number 6    Authorization - Number of Visits 10    Progress Note Due on Visit 10    PT Start Time 1654    PT Stop Time 1732    PT Time Calculation (min) 38 min    Activity Tolerance Patient tolerated treatment well    Behavior During Therapy [facility] for tasks assessed/performed           Past Medical History:  Diagnosis Date  . 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 GERD (gastroesophageal reflux disease)   . Hypertension   . Pulmonary embolus ([place])   . Sleep apnea    no cpap x 8 years    Past Surgical History:  Procedure Laterality Date  . COLONOSCOPY WITH PROPOFOL N/A 02/24/2019   Procedure: COLONOSCOPY WITH PROPOFOL;  Surgeon: Lucilla Lame, MD;  Location: [facility] ENDOSCOPY;  Service: Endoscopy;  Laterality: N/A;  . FRACTURE SURGERY Left    pinning leg fx  . LEFT HEART CATH AND CORONARY ANGIOGRAPHY Left 01/12/2019   Procedure: LEFT HEART CATH AND CORONARY ANGIOGRAPHY;  Surgeon: Teodoro Spray, MD;  Location: [facility] CV LAB;  Service: Cardiovascular;  Laterality: Left;  . NASAL SEPTOPLASTY W/ TURBINOPLASTY Bilateral 02/20/2016   Procedure: NASAL SEPTOPLASTY WITH TURBINATE REDUCTION;  Surgeon: Margaretha Sheffield, MD;  Location: [facility] ORS;  Service: ENT;  Laterality: Bilateral;  . PULMONARY THROMBECTOMY Bilateral 01/22/2019   Procedure: PULMONARY THROMBECTOMY;  Surgeon: Katha Cabal, MD;  Location: [place];  Service: Cardiovascular;  Laterality: Bilateral;    There were no vitals filed for this visit.   Subjective Assessment - 11/12/19 1654    Subjective Patient reports he is feeling okay today. His back is 1-2/10 and he is having the tinging in the R knee when he puts pressure on it. He feels like he needs to get up and move around. he did his exercises except for yesterday. He will be off elliquis for the next 7 days due to having a dental procedure next tuesday. He has been having trouble fitting everything he needs to do done.    Pertinent History Patient is a 59 y.o. male who presents to outpatient physical therapy with a referral for medical diagnosis chronic low back pain. This patient's chief complaints consist of chronic low/mid back, neck pain and pain in the B hip regions leading to the following functional deficits: difficulty bending, lifting, twisting, prolonged sitting, sleeping, yardwork, exercising, increases stress level. Relevant past medical history and comorbidities include major pulmonary embolism in 2020 (apparently recovered, no restrictions), hx DVT renal cysts (could not be removed), acid reflux, double hernia sx as a child, right side hernia sx as an adult, left leg fx/surgery, R elbow problems (cortisone shots), L lower leg comminuted fracture with ORIF (~20 yrs ago).  Patient denies hx of cancer, stroke, seizures, lung problem (except PE), major  cardiac events (besides DVT, clotting), diabetes, unexplained weight loss, stumbling, dropping things, spinal surgery, major spinal trauma.    Limitations Sitting;Lifting;Standing;House hold activities;Other (comment)   difficulty bending, lifting, twisting, prolonged sitting, sleeping, yardwork, exercising, increases stress level.   Diagnostic tests Abdominal CT 02/02/2019" Musculoskeletal: No blastic or lytic bone lesions. There are foci ofdegenerative change in the thoracic spine. There are nointramuscular lesions. No  abdominal wall lesions appreciable. IMPRESSION:1. There is a cyst arising from the medial mid right kidneymeasuring 9.3 x 7.6 x 7.3 cm. This cyst extends medially to a levelimmediately adjacent to the inferior vena cava. There is slightlocalized narrowing of the inferior vena cava at the site of thecyst without direct impression of the cyst upon the inferior venacava with supine positioning. There is no obstruction of theinferior vena cava in this area. No other large mass evident in theabdomen or pelvis. There are small renal cysts on each side. 2. There are multiple sigmoid and descending colonic diverticulawithout diverticulitis. No bowel obstruction. No abscess in theabdomen or pelvis. Appendix appears normal. 3.  Small hiatal hernia. 4. No renal or ureteral calculi. No hydronephrosis. Urinary bladderwall thickness normal. 5.  Aortic Atherosclerosis (ICD10-I70.0). 6.  Hepatic steatosis.    Patient Stated Goals get healthier    Currently in Pain? Yes    Pain Score 2     Pain Location Back    Pain Orientation Right;Left    Pain Onset 1 to 4 weeks ago            TREATMENT:  Therapeutic exercise: to centralize symptoms and improve ROM, strength, muscular endurance, and activity tolerance required for successful completion of functional activities.  - Treadmill at 3.5 mph at 0% grade with BUE support. For improved lower extremity mobility, muscular endurance, and weightbearing activity tolerance; and to induce the analgesic effect of aerobic exercise, stimulate improved joint nutrition, and prepare body structures and systems for following interventions. x 79minutes. During subjective exam and education on relaxation techniques.   - supine chin tuck x 20 with cuing for improved form.  (manual therapy)  - active hip flexion into pancake position form 17 inch chair focusing on active control of full range at hips. Patient unable to feel leg contraction when flexing forward even with external cuing to  decrease motion at the spine. 10x5 second hold.  For improved adductor ROM.   Manual therapy:to reduce pain and tissue tension, improve range of motion, neuromodulation, in order to promote improved ability to complete functional activities. - supine hip joint mobilizations grade III-IV with mob belt: R and L 3x30 seconds caudal and posterolateral.  - supine PROM hamstring stretch and hip adductor stretchwith contract-release technique. Both sides 3-4 cycles each muscle group.  - hooklying STM to bilateral hip adductors and medial hamstring groups.   HOME EXERCISE PROGRAM  Access Code: [email or web address]  URL: [email or web address]  Date: 10/13/2019  Prepared by: Rosita Kea  Exercises  Prone Press Up - 4 x daily - 10-15 reps - 1 second hold  Standing Lumbar Extension - 4 x daily - 10-15 reps - 1 sets - 1 second hold     PT Education - 11/12/19 1655    Education provided Yes    Education Details Exercise purpose/form. Self management techniques    Person(s) Educated Patient    Methods Explanation;Demonstration;Tactile cues;Verbal cues    Comprehension Verbalized understanding;Returned demonstration;Tactile cues required;Verbal cues required;Need further instruction            PT Short Term Goals - 11/12/19  1924      PT SHORT TERM GOAL #1   Title Be independent with initial home exercise program for self-management of symptoms.    Baseline Initial HEP provided at IE (10/13/2019);    Time 2    Period Weeks    Status Achieved    Target Date 10/27/19             PT Long Term Goals - 10/13/19 1441      PT LONG TERM GOAL #1   Title Be independent with a long-term home exercise program for self-management of symptoms.    Baseline Initial HEP provided at IE (10/13/2019);    Time 12    Period Weeks    Status New    Target Date --   TARGET DATE FOR ALL LONG TERM GOALS: 01/05/2020     PT LONG TERM GOAL #2   Title Demonstrate improved FOTO score to equal or greater than  75 by visit #9 to demonstrate improvement in overall condition and self-reported functional ability.    Baseline 70 (10/13/2019);    Time 12    Period Weeks    Status New      PT LONG TERM GOAL #3   Title Reduce pain with functional activities to equal or less than 1/10 to allow patient to complete usual activities including ADLs, IADLs, and social engagement with less difficulty.    Baseline up to 3/10 (10/13/2019);    Time 12    Period Weeks    Status New      PT LONG TERM GOAL #4   Title Be able to demonstrate floor to waist lift using 25# box for 5 reps with proper form without limitation due to current condition in order to improve ability to lift during usual activities.    Baseline squat with form lacking, reports fear of lifting (10/13/2019);    Time 12    Period Weeks    Status New      PT LONG TERM GOAL #5   Title Complete community, work and/or recreational activities without limitation due to current condition.    Baseline Functional Limitations: difficulty bending, lifting, twisting, prolonged sitting, sleeping, yardwork, exercising, increases stress level (10/13/2019);    Time 12    Period Weeks    Status New                 Plan - 11/12/19 1924    Clinical Impression Statement Patient tolerated treatment well and reports improved tolerance for hip joint mobilizations. Continues to feel very stiff in the hamstrings and adductors to stretching. Reports feeling like some of his group exercises are getting gradually easier and his hips are moving a bit better. Patient would benefit from continued management of limiting condition by skilled physical therapist to address remaining impairments and functional limitations to work towards stated goals and return to PLOF or maximal functional independence.    Personal Factors and Comorbidities Age;Behavior Pattern;Comorbidity 3+;Sex;Past/Current Experience;Time since onset of injury/illness/exacerbation;Social Background     Comorbidities Relevant past medical history and comorbidities include major pulmonary embolism in 2020 (apparently recovered, no restrictions), hx DVT renal cysts (could not be removed), acid reflux, double hernia sx as a child, right side hernia sx as an adult, left leg fx/surgery, R elbow problems (cortisone shots), L lower leg comminuted fracture with ORIF (~20 yrs ago).    Examination-Activity Limitations Lift;Bend;Squat;Other;Carry   difficulty bending, lifting, twisting, prolonged sitting, sleeping, yardwork, exercising, increases stress level.   Examination-Participation Restrictions [employer]  Work;Community Activity;Driving;Interpersonal Relationship    Stability/Clinical Decision Making Evolving/Moderate complexity    Rehab Potential Good    PT Frequency 2x / week    PT Duration 12 weeks    PT Treatment/Interventions ADLs/Self Care Home Management;Biofeedback;Cryotherapy;Moist Heat;Traction;Therapeutic activities;Therapeutic exercise;Neuromuscular re-education;Patient/family education;Manual techniques;Passive range of motion;Dry needling;Joint Manipulations;Spinal Manipulations    PT Next Visit Plan manual therapy, joint mobiliizations, funcitonal strengthening, educatoin    PT Home Exercise Plan Medbridge Access Code: [email or web address]    Consulted and Agree with Plan of Care Patient           Patient will benefit from skilled therapeutic intervention in order to improve the following deficits and impairments:  Pain, Improper body mechanics, Increased muscle spasms, Decreased activity tolerance, Decreased endurance, Decreased range of motion, Hypomobility, Impaired perceived functional ability, Impaired flexibility  Visit Diagnosis: Chronic bilateral low back pain, unspecified whether sciatica present  Pain in right hip  Pain in left hip  Pain in thoracic spine  Cervicalgia  Other muscle spasm     Problem List Patient Active Problem List   Diagnosis Date Noted  . Special screening  for malignant neoplasms, colon   . Other pulmonary embolism with acute cor pulmonale ([place]) 02/11/2019  . GERD (gastroesophageal reflux disease) 02/02/2019  . Primary hypercoagulable state ([place]) 02/02/2019  . Renal cyst 02/02/2019  . At risk for secondary malignancy 02/02/2019  . Pulmonary embolism ([place]) 01/22/2019  . Daytime somnolence 01/19/2019  . SOB (shortness of breath) 01/19/2019  . Family history of ASCVD (arteriosclerotic cardiovascular disease) 07/17/2017  . Hyperlipemia, mixed 05/04/2014    Everlean Alstrom. Graylon Good, PT, DPT 11/12/19, 7:25 PM  [facility] [address] [place], [address] Phone: [phone]   Fax:  [phone]  Name: Rashon Westrup MRN: [id number] Date of Birth: Jul 18, 1960

## 2019-11-16 ENCOUNTER — Encounter: Payer: Commercial Managed Care - PPO | Admitting: Physical Therapy

## 2019-11-18 ENCOUNTER — Other Ambulatory Visit: Payer: Self-pay

## 2019-11-18 ENCOUNTER — Encounter: Payer: Self-pay | Admitting: Physical Therapy

## 2019-11-18 ENCOUNTER — Ambulatory Visit: Payer: Commercial Managed Care - PPO | Attending: Internal Medicine | Admitting: Physical Therapy

## 2019-11-18 DIAGNOSIS — G8929 Other chronic pain: Secondary | ICD-10-CM | POA: Insufficient documentation

## 2019-11-18 DIAGNOSIS — M62838 Other muscle spasm: Secondary | ICD-10-CM

## 2019-11-18 DIAGNOSIS — M545 Low back pain: Secondary | ICD-10-CM | POA: Insufficient documentation

## 2019-11-18 DIAGNOSIS — M542 Cervicalgia: Secondary | ICD-10-CM | POA: Insufficient documentation

## 2019-11-18 DIAGNOSIS — M546 Pain in thoracic spine: Secondary | ICD-10-CM | POA: Diagnosis present

## 2019-11-18 DIAGNOSIS — M25552 Pain in left hip: Secondary | ICD-10-CM | POA: Insufficient documentation

## 2019-11-18 DIAGNOSIS — M25551 Pain in right hip: Secondary | ICD-10-CM | POA: Diagnosis present

## 2019-11-18 NOTE — Therapy (Signed)
[facility] [address], [place], [address] Phone: [phone]   Fax:  [phone]  Physical Therapy Treatment  Patient Details  Name: Nathan Scott MRN: [id number] Date of Birth: 05/23/1960 Referring Provider (PT): Rusty Aus, MD   Encounter Date: 11/18/2019   PT End of Session - 11/18/19 1837    Visit Number 7    Number of Visits 24    Date for PT Re-Evaluation 01/05/20    Authorization Type [place] reporting period from 10/13/2019    Authorization Time Period n/a    Authorization - Visit Number 7    Authorization - Number of Visits 10    Progress Note Due on Visit 10    PT Start Time 7353    PT Stop Time 1730    PT Time Calculation (min) 40 min    Activity Tolerance Patient tolerated treatment well    Behavior During Therapy [facility] for tasks assessed/performed           Past Medical History:  Diagnosis Date  . 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 GERD (gastroesophageal reflux disease)   . Hypertension   . Pulmonary embolus ([place])   . Sleep apnea    no cpap x 8 years    Past Surgical History:  Procedure Laterality Date  . COLONOSCOPY WITH PROPOFOL N/A 02/24/2019   Procedure: COLONOSCOPY WITH PROPOFOL;  Surgeon: Lucilla Lame, MD;  Location: [facility] ENDOSCOPY;  Service: Endoscopy;  Laterality: N/A;  . FRACTURE SURGERY Left    pinning leg fx  . LEFT HEART CATH AND CORONARY ANGIOGRAPHY Left 01/12/2019   Procedure: LEFT HEART CATH AND CORONARY ANGIOGRAPHY;  Surgeon: Teodoro Spray, MD;  Location: [place] CV LAB;  Service: Cardiovascular;  Laterality: Left;  . NASAL SEPTOPLASTY W/ TURBINOPLASTY Bilateral 02/20/2016   Procedure: NASAL SEPTOPLASTY WITH TURBINATE REDUCTION;  Surgeon: Margaretha Sheffield, MD;  Location: [facility] ORS;  Service: ENT;  Laterality: Bilateral;  . PULMONARY THROMBECTOMY Bilateral 01/22/2019   Procedure: PULMONARY THROMBECTOMY;  Surgeon: Katha Cabal, MD;  Location: [place] CV LAB;  Service: Cardiovascular;  Laterality: Bilateral;    There were no vitals filed for this visit.   Subjective Assessment - 11/18/19 1655    Subjective Patient reports he just lost a couple of weeks of work by deleting something at work and it is pretty upsetting. He had a three hour dental procedure yesterday and is a little sore from that but not in his back. Back is feeling good today but R knee is bugging him and he has a brace on it. It is twinging and he feels like he had a bit of a tear.    Pertinent History Patient is a 59 y.o. male who presents to outpatient physical therapy with a referral for medical diagnosis chronic low back pain. This patient's chief complaints consist of chronic low/mid back, neck pain and pain in the B hip regions leading to the following functional deficits: difficulty bending, lifting, twisting, prolonged sitting, sleeping, yardwork, exercising, increases stress level. Relevant past medical history and comorbidities include major pulmonary embolism in 2020 (apparently recovered, no restrictions), hx DVT renal cysts (could not be removed), acid reflux, double hernia sx as a child, right side hernia sx as an adult, left leg fx/surgery, R elbow problems (cortisone shots), L lower leg comminuted fracture with ORIF (~20 yrs ago).  Patient denies hx of cancer, stroke, seizures, lung problem (except PE), major cardiac events (  besides DVT, clotting), diabetes, unexplained weight loss, stumbling, dropping things, spinal surgery, major spinal trauma.    Limitations Sitting;Lifting;Standing;House hold activities;Other (comment)   difficulty bending, lifting, twisting, prolonged sitting, sleeping, yardwork, exercising, increases stress level.   Diagnostic tests Abdominal CT 02/02/2019" Musculoskeletal: No blastic or lytic bone lesions. There are foci ofdegenerative change in the thoracic spine. There are nointramuscular lesions. No abdominal wall lesions appreciable.  IMPRESSION:1. There is a cyst arising from the medial mid right kidneymeasuring 9.3 x 7.6 x 7.3 cm. This cyst extends medially to a levelimmediately adjacent to the inferior vena cava. There is slightlocalized narrowing of the inferior vena cava at the site of thecyst without direct impression of the cyst upon the inferior venacava with supine positioning. There is no obstruction of theinferior vena cava in this area. No other large mass evident in theabdomen or pelvis. There are small renal cysts on each side. 2. There are multiple sigmoid and descending colonic diverticulawithout diverticulitis. No bowel obstruction. No abscess in theabdomen or pelvis. Appendix appears normal. 3.  Small hiatal hernia. 4. No renal or ureteral calculi. No hydronephrosis. Urinary bladderwall thickness normal. 5.  Aortic Atherosclerosis (ICD10-I70.0). 6.  Hepatic steatosis.    Patient Stated Goals get healthier    Currently in Pain? No/denies    Pain Onset 1 to 4 weeks ago           TREATMENT:  Therapeutic exercise:to centralize symptoms and improve ROM, strength, muscular endurance, and activity tolerance required for successful completion of functional activities.   - Treadmill at 3.56mph at 0% grade with BUE support. For improved lower extremity mobility, muscular endurance, and weightbearing activity tolerance; and to induce the analgesic effect of aerobic exercise, stimulate improved joint nutrition, and prepare body structures and systems for following interventions. x 62minutes. During subjective exam and education on relaxation techniques.  - active hip extension into pancake position form 17 inch chair focusing on active control of full range at hips. Patient unable to feel leg contraction when flexing forward even with external cuing to decrease motion at the spine. 10x5 second hold.  For improved adductor ROM.  Manual therapy:to reduce pain and tissue tension, improve range of motion, neuromodulation,  in order to promote improved ability to complete functional activities. - supine hip joint mobilizations grade III-IV with mob belt: Rand L3x30 seconds caudal and posterolateral.  - supine PROM hamstring stretch and hip adductor stretch Both sides 3-4 cycles of prolonged hold and rest each muscle group.   HOME EXERCISE PROGRAM  Access Code: [email or web address]  URL: [email or web address]  Date: 10/13/2019  Prepared by: Rosita Kea  Exercises  Prone Press Up - 4 x daily - 10-15 reps - 1 second hold  Standing Lumbar Extension - 4 x daily - 10-15 reps - 1 sets - 1 second hold     PT Education - 11/18/19 1837    Education provided Yes    Education Details Exercise purpose/form. Self management techniques    Person(s) Educated Patient    Methods Explanation;Demonstration;Tactile cues;Verbal cues    Comprehension Verbalized understanding;Returned demonstration;Verbal cues required;Tactile cues required;Need further instruction            PT Short Term Goals - 11/12/19 1924      PT SHORT TERM GOAL #1   Title Be independent with initial home exercise program for self-management of symptoms.    Baseline Initial HEP provided at IE (10/13/2019);    Time 2    Period Weeks    Status Achieved  Target Date 10/27/19             PT Long Term Goals - 10/13/19 1441      PT LONG TERM GOAL #1   Title Be independent with a long-term home exercise program for self-management of symptoms.    Baseline Initial HEP provided at IE (10/13/2019);    Time 12    Period Weeks    Status New    Target Date --   TARGET DATE FOR ALL LONG TERM GOALS: 01/05/2020     PT LONG TERM GOAL #2   Title Demonstrate improved FOTO score to equal or greater than 75 by visit #9 to demonstrate improvement in overall condition and self-reported functional ability.    Baseline 70 (10/13/2019);    Time 12    Period Weeks    Status New      PT LONG TERM GOAL #3   Title Reduce pain with functional activities to  equal or less than 1/10 to allow patient to complete usual activities including ADLs, IADLs, and social engagement with less difficulty.    Baseline up to 3/10 (10/13/2019);    Time 12    Period Weeks    Status New      PT LONG TERM GOAL #4   Title Be able to demonstrate floor to waist lift using 25# box for 5 reps with proper form without limitation due to current condition in order to improve ability to lift during usual activities.    Baseline squat with form lacking, reports fear of lifting (10/13/2019);    Time 12    Period Weeks    Status New      PT LONG TERM GOAL #5   Title Complete community, work and/or recreational activities without limitation due to current condition.    Baseline Functional Limitations: difficulty bending, lifting, twisting, prolonged sitting, sleeping, yardwork, exercising, increases stress level (10/13/2019);    Time 12    Period Weeks    Status New                 Plan - 11/18/19 1841    Clinical Impression Statement Patient tolerated treatment well and continues to report sensation of strong tightness in hamstrings and hip adductors with passive stretching. Continues to tolerate hip mobilizations well and better than initially. Shows improved comfort and range with self-stretching. Continues to complain of pain at the R knee and may benefit from screening this problem next session. Patient would benefit from continued management of limiting condition by skilled physical therapist to address remaining impairments and functional limitations to work towards stated goals and return to PLOF or maximal functional independence.    Personal Factors and Comorbidities Age;Behavior Pattern;Comorbidity 3+;Sex;Past/Current Experience;Time since onset of injury/illness/exacerbation;Social Background    Comorbidities Relevant past medical history and comorbidities include major pulmonary embolism in 2020 (apparently recovered, no restrictions), hx DVT renal cysts  (could not be removed), acid reflux, double hernia sx as a child, right side hernia sx as an adult, left leg fx/surgery, R elbow problems (cortisone shots), L lower leg comminuted fracture with ORIF (~20 yrs ago).    Examination-Activity Limitations Lift;Bend;Squat;Other;Carry   difficulty bending, lifting, twisting, prolonged sitting, sleeping, yardwork, exercising, increases stress level.   Examination-Participation Restrictions Yard Work;Community Activity;Driving;Interpersonal Relationship    Stability/Clinical Decision Making Evolving/Moderate complexity    Rehab Potential Good    PT Frequency 2x / week    PT Duration 12 weeks    PT Treatment/Interventions ADLs/Self Care Home Management;Biofeedback;Cryotherapy;Moist Heat;Traction;Therapeutic  activities;Therapeutic exercise;Neuromuscular re-education;Patient/family education;Manual techniques;Passive range of motion;Dry needling;Joint Manipulations;Spinal Manipulations    PT Next Visit Plan manual therapy, joint mobiliizations, funcitonal strengthening, educatoin    PT Home Exercise Plan Medbridge Access Code: [email or web address]    Consulted and Agree with Plan of Care Patient           Patient will benefit from skilled therapeutic intervention in order to improve the following deficits and impairments:  Pain, Improper body mechanics, Increased muscle spasms, Decreased activity tolerance, Decreased endurance, Decreased range of motion, Hypomobility, Impaired perceived functional ability, Impaired flexibility  Visit Diagnosis: Chronic bilateral low back pain, unspecified whether sciatica present  Pain in right hip  Pain in left hip  Pain in thoracic spine  Cervicalgia  Other muscle spasm     Problem List Patient Active Problem List   Diagnosis Date Noted  . Special screening for malignant neoplasms, colon   . Other pulmonary embolism with acute cor pulmonale ([place]) 02/11/2019  . GERD (gastroesophageal reflux disease) 02/02/2019  .  Primary hypercoagulable state ([place]) 02/02/2019  . Renal cyst 02/02/2019  . At risk for secondary malignancy 02/02/2019  . Pulmonary embolism ([place]) 01/22/2019  . Daytime somnolence 01/19/2019  . SOB (shortness of breath) 01/19/2019  . Family history of ASCVD (arteriosclerotic cardiovascular disease) 07/17/2017  . Hyperlipemia, mixed 05/04/2014    Everlean Alstrom. Graylon Good, PT, DPT 11/18/19, 6:41 PM  [place] [facility] [address], [place], [address] Phone: [phone]   Fax:  [phone]  Name: Nathan Scott MRN: [id number] Date of Birth: 04-Scott-1962

## 2019-11-19 ENCOUNTER — Encounter: Payer: Commercial Managed Care - PPO | Admitting: Physical Therapy

## 2019-11-23 ENCOUNTER — Encounter: Payer: Commercial Managed Care - PPO | Admitting: Physical Therapy

## 2019-11-25 ENCOUNTER — Other Ambulatory Visit: Payer: Self-pay

## 2019-11-25 ENCOUNTER — Encounter: Payer: Self-pay | Admitting: Physical Therapy

## 2019-11-25 ENCOUNTER — Ambulatory Visit: Payer: Commercial Managed Care - PPO | Admitting: Physical Therapy

## 2019-11-25 DIAGNOSIS — M25552 Pain in left hip: Secondary | ICD-10-CM

## 2019-11-25 DIAGNOSIS — M545 Low back pain, unspecified: Secondary | ICD-10-CM

## 2019-11-25 DIAGNOSIS — M62838 Other muscle spasm: Secondary | ICD-10-CM

## 2019-11-25 DIAGNOSIS — M25551 Pain in right hip: Secondary | ICD-10-CM

## 2019-11-25 DIAGNOSIS — M542 Cervicalgia: Secondary | ICD-10-CM

## 2019-11-25 DIAGNOSIS — M546 Pain in thoracic spine: Secondary | ICD-10-CM

## 2019-11-25 NOTE — Therapy (Signed)
[place] [facility] [address], [place], [address] Phone: [phone]   Fax:  [phone]  Physical Therapy Treatment  Patient Details  Name: Nathan Scott MRN: [id number] Date of Birth: July 12, 1960 Referring Provider (PT): Rusty Aus, MD   Encounter Date: 11/25/2019   PT End of Session - 11/25/19 1726    Visit Number 8    Number of Visits 24    Date for PT Re-Evaluation 01/05/20    Authorization Type [place] reporting period from 10/13/2019    Authorization Time Period n/a    Authorization - Visit Number 8    Authorization - Number of Visits 10    Progress Note Due on Visit 10    PT Start Time 1645    PT Stop Time 1725    PT Time Calculation (min) 40 min    Activity Tolerance Patient tolerated treatment well    Behavior During Therapy [facility] for tasks assessed/performed           Past Medical History:  Diagnosis Date  . 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 GERD (gastroesophageal reflux disease)   . Hypertension   . Pulmonary embolus ([place])   . Sleep apnea    no cpap x 8 years    Past Surgical History:  Procedure Laterality Date  . COLONOSCOPY WITH PROPOFOL N/A 02/24/2019   Procedure: COLONOSCOPY WITH PROPOFOL;  Surgeon: Lucilla Lame, MD;  Location: [facility] ENDOSCOPY;  Service: Endoscopy;  Laterality: N/A;  . FRACTURE SURGERY Left    pinning leg fx  . LEFT HEART CATH AND CORONARY ANGIOGRAPHY Left 01/12/2019   Procedure: LEFT HEART CATH AND CORONARY ANGIOGRAPHY;  Surgeon: Teodoro Spray, MD;  Location: [place] [facility];  Service: Cardiovascular;  Laterality: Left;  . NASAL SEPTOPLASTY W/ TURBINOPLASTY Bilateral 02/20/2016   Procedure: NASAL SEPTOPLASTY WITH TURBINATE REDUCTION;  Surgeon: Margaretha Sheffield, MD;  Location: [facility] ORS;  Service: ENT;  Laterality: Bilateral;  . PULMONARY THROMBECTOMY Bilateral 01/22/2019   Procedure: PULMONARY THROMBECTOMY;  Surgeon: Katha Cabal, MD;  Location: [place] [facility];  Service: Cardiovascular;  Laterality: Bilateral;    There were no vitals filed for this visit.   Subjective Assessment - 11/25/19 1653    Subjective Patient reports his pain is barely 1/10 in the back, R hip, and knee. His knee continues to bother him and his hip feels "loose" in a bad way. He did a lot of work this weekend on the bathroom. Patient reports he is feeling like he is making some progress in his flexibility as long as he keeps doing his exercises at home and participating in his exercise group, He feels he may be nearing readiness for discharge from PT.    Pertinent History Patient is a 59 y.o. male who presents to outpatient physical therapy with a referral for medical diagnosis chronic low back pain. This patient's chief complaints consist of chronic low/mid back, neck pain and pain in the B hip regions leading to the following functional deficits: difficulty bending, lifting, twisting, prolonged sitting, sleeping, yardwork, exercising, increases stress level. Relevant past medical history and comorbidities include major pulmonary embolism in 2020 (apparently recovered, no restrictions), hx DVT renal cysts (could not be removed), acid reflux, double hernia sx as a child, right side hernia sx as an adult, left leg fx/surgery, R elbow problems (cortisone shots), L lower leg comminuted fracture with ORIF (~20 yrs ago).  Patient denies hx of cancer, stroke,  seizures, lung problem (except PE), major cardiac events (besides DVT, clotting), diabetes, unexplained weight loss, stumbling, dropping things, spinal surgery, major spinal trauma.    Limitations Sitting;Lifting;Standing;House hold activities;Other (comment)   difficulty bending, lifting, twisting, prolonged sitting, sleeping, yardwork, exercising, increases stress level.   Diagnostic tests Abdominal CT 02/02/2019" Musculoskeletal: No blastic or lytic bone lesions. There are foci ofdegenerative change in the thoracic spine.  There are nointramuscular lesions. No abdominal wall lesions appreciable. IMPRESSION:1. There is a cyst arising from the medial mid right kidneymeasuring 9.3 x 7.6 x 7.3 cm. This cyst extends medially to a levelimmediately adjacent to the inferior vena cava. There is slightlocalized narrowing of the inferior vena cava at the site of thecyst without direct impression of the cyst upon the inferior venacava with supine positioning. There is no obstruction of theinferior vena cava in this area. No other large mass evident in theabdomen or pelvis. There are small renal cysts on each side. 2. There are multiple sigmoid and descending colonic diverticulawithout diverticulitis. No bowel obstruction. No abscess in theabdomen or pelvis. Appendix appears normal. 3.  Small hiatal hernia. 4. No renal or ureteral calculi. No hydronephrosis. Urinary bladderwall thickness normal. 5.  Aortic Atherosclerosis (ICD10-I70.0). 6.  Hepatic steatosis.    Patient Stated Goals get healthier    Currently in Pain? Yes    Pain Score 1     Pain Location --   back, R hip, R knee   Pain Orientation Right    Pain Onset More than a month ago           TREATMENT:  Therapeutic exercise:to centralize symptoms and improve ROM, strength, muscular endurance, and activity tolerance required for successful completion of functional activities.  - Treadmill at 3.61mph at 0% grade with BUE support. For improved lower extremity mobility, muscular endurance, and weightbearing activity tolerance; and to induce the analgesic effect of aerobic exercise, stimulate improved joint nutrition, and prepare body structures and systems for following interventions. x 35minutes. During subjective exam. - lumbar extension in prone x 3, x7 - lying prone in extension (prone on elbows) 2-3 min intermittent between extensions. - seated active R knee extension 2x15 with cuing to stop flexion knee following. Improved knee flexion and pain following. Added to HEP  with handout and education.  - Sidelying open book (thoracic rotation) to improve thoracic, shoulder girdle, and upper trunk mobility. Required instruction for technique and cuing to achieve end range as tolerated, hold time, and breathing technique. x5-10 each side.  - thoracic extension in standing on wall x 15. Attempted various positions but unable to get effective stretch.  - seated adductor stretch with legs in V position 2x5 each side and forwards with 10 second holds.   Manual therapy:to reduce pain and tissue tension, improve range of motion, neuromodulation, in order to promote improved ability to complete functional activities. - hooklying over EK wedged positioned at upper thoracic spine in 4 different places, grade V thrust through elbows behind head, one cavitation.  - seated distraction grade V joint mobilization at CT joint with cavitation.    HOME EXERCISE PROGRAM  Access Code: [email or web address]  URL: [email or web address]  Date: 10/13/2019  Prepared by: Rosita Kea  Exercises  Prone Press Up - 4 x daily - 10-15 reps - 1 second hold  Standing Lumbar Extension - 4 x daily - 10-15 reps - 1 sets - 1 second hold        PT Education - 11/25/19 1749    Education provided Yes  Education Details Exercise purpose/form. Self management techniques. HEP    Person(s) Educated Patient    Methods Explanation;Demonstration;Tactile cues;Verbal cues;Handout    Comprehension Verbalized understanding;Returned demonstration;Verbal cues required;Tactile cues required;Need further instruction            PT Short Term Goals - 11/12/19 1924      PT SHORT TERM GOAL #1   Title Be independent with initial home exercise program for self-management of symptoms.    Baseline Initial HEP provided at IE (10/13/2019);    Time 2    Period Weeks    Status Achieved    Target Date 10/27/19             PT Long Term Goals - 10/13/19 1441      PT LONG TERM GOAL #1   Title Be  independent with a long-term home exercise program for self-management of symptoms.    Baseline Initial HEP provided at IE (10/13/2019);    Time 12    Period Weeks    Status New    Target Date --   TARGET DATE FOR ALL LONG TERM GOALS: 01/05/2020     PT LONG TERM GOAL #2   Title Demonstrate improved FOTO score to equal or greater than 75 by visit #9 to demonstrate improvement in overall condition and self-reported functional ability.    Baseline 70 (10/13/2019);    Time 12    Period Weeks    Status New      PT LONG TERM GOAL #3   Title Reduce pain with functional activities to equal or less than 1/10 to allow patient to complete usual activities including ADLs, IADLs, and social engagement with less difficulty.    Baseline up to 3/10 (10/13/2019);    Time 12    Period Weeks    Status New      PT LONG TERM GOAL #4   Title Be able to demonstrate floor to waist lift using 25# box for 5 reps with proper form without limitation due to current condition in order to improve ability to lift during usual activities.    Baseline squat with form lacking, reports fear of lifting (10/13/2019);    Time 12    Period Weeks    Status New      PT LONG TERM GOAL #5   Title Complete community, work and/or recreational activities without limitation due to current condition.    Baseline Functional Limitations: difficulty bending, lifting, twisting, prolonged sitting, sleeping, yardwork, exercising, increases stress level (10/13/2019);    Time 12    Period Weeks    Status New                 Plan - 11/25/19 1757    Clinical Impression Statement Patient tolerated treatment well overall. Responded to repeated R AROM knee extension with improved flexion and decrease pain. No effect on hip or knee with lumbar extension exercises. Patient reported mild relief with joint mobilizations to thoracic spine but did not feel good stretch in the desired region with open book or thoracic extension on the wall.  Continues to be very stiff through the lumbar spine. Discussed possible discharge and pt's concerns about his knee. Patient elected to move next appointment to 2 weeks out and discuss again at that point. Patient would benefit from continued management of limiting condition by skilled physical therapist to address remaining impairments and functional limitations to work towards stated goals and return to PLOF or maximal functional independence.    Personal  Factors and Comorbidities Age;Behavior Pattern;Comorbidity 3+;Sex;Past/Current Experience;Time since onset of injury/illness/exacerbation;Social Background    Comorbidities Relevant past medical history and comorbidities include major pulmonary embolism in 2020 (apparently recovered, no restrictions), hx DVT renal cysts (could not be removed), acid reflux, double hernia sx as a child, right side hernia sx as an adult, left leg fx/surgery, R elbow problems (cortisone shots), L lower leg comminuted fracture with ORIF (~20 yrs ago).    Examination-Activity Limitations Lift;Bend;Squat;Other;Carry   difficulty bending, lifting, twisting, prolonged sitting, sleeping, yardwork, exercising, increases stress level.   Examination-Participation Restrictions Yard Work;Community Activity;Driving;Interpersonal Relationship    Stability/Clinical Decision Making Evolving/Moderate complexity    Rehab Potential Good    PT Frequency 2x / week    PT Duration 12 weeks    PT Treatment/Interventions ADLs/Self Care Home Management;Biofeedback;Cryotherapy;Moist Heat;Traction;Therapeutic activities;Therapeutic exercise;Neuromuscular re-education;Patient/family education;Manual techniques;Passive range of motion;Dry needling;Joint Manipulations;Spinal Manipulations    PT Next Visit Plan manual therapy, joint mobiliizations, funcitonal strengthening, educatoin    PT Home Exercise Plan Medbridge Access Code: [email or web address]    Consulted and Agree with Plan of Care Patient            Patient will benefit from skilled therapeutic intervention in order to improve the following deficits and impairments:  Pain, Improper body mechanics, Increased muscle spasms, Decreased activity tolerance, Decreased endurance, Decreased range of motion, Hypomobility, Impaired perceived functional ability, Impaired flexibility  Visit Diagnosis: Chronic bilateral low back pain, unspecified whether sciatica present  Pain in right hip  Pain in left hip  Pain in thoracic spine  Cervicalgia  Other muscle spasm     Problem List Patient Active Problem List   Diagnosis Date Noted  . Special screening for malignant neoplasms, colon   . Other pulmonary embolism with acute cor pulmonale ([place]) 02/11/2019  . GERD (gastroesophageal reflux disease) 02/02/2019  . Primary hypercoagulable state ([place]) 02/02/2019  . Renal cyst 02/02/2019  . At risk for secondary malignancy 02/02/2019  . Pulmonary embolism ([place]) 01/22/2019  . Daytime somnolence 01/19/2019  . SOB (shortness of breath) 01/19/2019  . Family history of ASCVD (arteriosclerotic cardiovascular disease) 07/17/2017  . Hyperlipemia, mixed 05/04/2014    Everlean Alstrom. Graylon Good, PT, DPT 11/25/19, 5:59 PM  [place] [facility] [address] [place], [address] Phone: [phone]   Fax:  [phone]  Name: Nathan Scott MRN: [id number] Date of Birth: 1960/05/24

## 2019-11-26 ENCOUNTER — Encounter: Payer: Commercial Managed Care - PPO | Admitting: Physical Therapy

## 2019-11-30 ENCOUNTER — Encounter: Payer: Commercial Managed Care - PPO | Admitting: Physical Therapy

## 2019-12-02 ENCOUNTER — Ambulatory Visit: Payer: Commercial Managed Care - PPO | Admitting: Physical Therapy

## 2019-12-03 ENCOUNTER — Encounter: Payer: Commercial Managed Care - PPO | Admitting: Physical Therapy

## 2019-12-08 ENCOUNTER — Other Ambulatory Visit
Admission: RE | Admit: 2019-12-08 | Discharge: 2019-12-08 | Disposition: A | Discharge status: Home / Self Care | Payer: Commercial Managed Care - PPO | Source: Ambulatory Visit | Attending: Sports Medicine | Admitting: Sports Medicine

## 2019-12-08 DIAGNOSIS — M25421 Effusion, right elbow: Secondary | ICD-10-CM | POA: Insufficient documentation

## 2019-12-08 DIAGNOSIS — L03113 Cellulitis of right upper limb: Secondary | ICD-10-CM | POA: Diagnosis not present

## 2019-12-08 DIAGNOSIS — M7021 Olecranon bursitis, right elbow: Secondary | ICD-10-CM | POA: Insufficient documentation

## 2019-12-08 LAB — SYNOVIAL CELL COUNT + DIFF, W/ CRYSTALS
Crystals, Fluid: NONE SEEN
Eosinophils-Synovial: 0 %
Lymphocytes-Synovial Fld: 0 %
Monocyte-Macrophage-Synovial Fluid: 2 %
Neutrophil, Synovial: 98 %
Other Cells-SYN: 0
WBC, Synovial: 11949 /mm3 — ABNORMAL HIGH (ref 0–200)

## 2019-12-09 ENCOUNTER — Ambulatory Visit: Payer: Commercial Managed Care - PPO | Admitting: Physical Therapy

## 2019-12-14 ENCOUNTER — Encounter: Payer: Commercial Managed Care - PPO | Admitting: Physical Therapy

## 2019-12-15 ENCOUNTER — Encounter: Payer: Self-pay | Admitting: Physical Therapy

## 2019-12-15 DIAGNOSIS — M25551 Pain in right hip: Secondary | ICD-10-CM

## 2019-12-15 DIAGNOSIS — M25552 Pain in left hip: Secondary | ICD-10-CM

## 2019-12-15 DIAGNOSIS — M62838 Other muscle spasm: Secondary | ICD-10-CM

## 2019-12-15 DIAGNOSIS — M545 Low back pain, unspecified: Secondary | ICD-10-CM

## 2019-12-15 DIAGNOSIS — M546 Pain in thoracic spine: Secondary | ICD-10-CM

## 2019-12-15 DIAGNOSIS — M542 Cervicalgia: Secondary | ICD-10-CM

## 2019-12-15 NOTE — Therapy (Addendum)
[place] [facility] [address] [place], [address] Phone: [phone]   Fax:  [phone]  Physical Therapy No-Visit Discharge Summary Reporting Period: 10/13/2019 - 12/15/2019  Patient Details  Name: Gareth Fitzner MRN: [id number] Date of Birth: Nov 04, 1960 Referring Provider (PT): Rusty Aus, MD   Encounter Date: 12/15/2019    Past Medical History:  Diagnosis Date  . 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 GERD (gastroesophageal reflux disease)   . Hypertension   . Pulmonary embolus ([place])   . Sleep apnea    no cpap x 8 years    Past Surgical History:  Procedure Laterality Date  . COLONOSCOPY WITH PROPOFOL N/A 02/24/2019   Procedure: COLONOSCOPY WITH PROPOFOL;  Surgeon: Lucilla Lame, MD;  Location: [facility] ENDOSCOPY;  Service: Endoscopy;  Laterality: N/A;  . FRACTURE SURGERY Left    pinning leg fx  . LEFT HEART CATH AND CORONARY ANGIOGRAPHY Left 01/12/2019   Procedure: LEFT HEART CATH AND CORONARY ANGIOGRAPHY;  Surgeon: Teodoro Spray, MD;  Location: [place] [facility];  Service: Cardiovascular;  Laterality: Left;  . NASAL SEPTOPLASTY W/ TURBINOPLASTY Bilateral 02/20/2016   Procedure: NASAL SEPTOPLASTY WITH TURBINATE REDUCTION;  Surgeon: Margaretha Sheffield, MD;  Location: [facility] ORS;  Service: ENT;  Laterality: Bilateral;  . PULMONARY THROMBECTOMY Bilateral 01/22/2019   Procedure: PULMONARY THROMBECTOMY;  Surgeon: Katha Cabal, MD;  Location: [place] [facility];  Service: Cardiovascular;  Laterality: Bilateral;    There were no vitals filed for this visit.   Subjective Assessment - 12/15/19 1448    Subjective Called patient and spoke to him on the phone. He states he has been very busy with bathroom remodeling and got staph infection in his elbow which is taking priority. Agreed that he feels like he knows what to do to help his back and is ready to discharge from PT at this time. Agreed he can get a referral to return  at a future date if he needs to.    Pertinent History Patient is a 59 y.o. male who presents to outpatient physical therapy with a referral for medical diagnosis chronic low back pain. This patient's chief complaints consist of chronic low/mid back, neck pain and pain in the B hip regions leading to the following functional deficits: difficulty bending, lifting, twisting, prolonged sitting, sleeping, yardwork, exercising, increases stress level. Relevant past medical history and comorbidities include major pulmonary embolism in 2020 (apparently recovered, no restrictions), hx DVT renal cysts (could not be removed), acid reflux, double hernia sx as a child, right side hernia sx as an adult, left leg fx/surgery, R elbow problems (cortisone shots), L lower leg comminuted fracture with ORIF (~20 yrs ago).  Patient denies hx of cancer, stroke, seizures, lung problem (except PE), major cardiac events (besides DVT, clotting), diabetes, unexplained weight loss, stumbling, dropping things, spinal surgery, major spinal trauma.    Limitations Sitting;Lifting;Standing;House hold activities;Other (comment)   difficulty bending, lifting, twisting, prolonged sitting, sleeping, yardwork, exercising, increases stress level.   Diagnostic tests Abdominal CT 02/02/2019" Musculoskeletal: No blastic or lytic bone lesions. There are foci ofdegenerative change in the thoracic spine. There are nointramuscular lesions. No abdominal wall lesions appreciable. IMPRESSION:1. There is a cyst arising from the medial mid right kidneymeasuring 9.3 x 7.6 x 7.3 cm. This cyst extends medially to a levelimmediately adjacent to the inferior vena cava. There is slightlocalized narrowing of the inferior vena cava at the site of thecyst without direct impression of the  cyst upon the inferior venacava with supine positioning. There is no obstruction of theinferior vena cava in this area. No other large mass evident in theabdomen or pelvis. There are small  renal cysts on each side. 2. There are multiple sigmoid and descending colonic diverticulawithout diverticulitis. No bowel obstruction. No abscess in theabdomen or pelvis. Appendix appears normal. 3.  Small hiatal hernia. 4. No renal or ureteral calculi. No hydronephrosis. Urinary bladderwall thickness normal. 5.  Aortic Atherosclerosis (ICD10-I70.0). 6.  Hepatic steatosis.    Patient Stated Goals get healthier    Pain Onset More than a month ago           OBJECTIVE Patient is not present for examination at this time. Please see previous documentation for latest objective data.   FOTO = 75 (12/15/2019)    PT Short Term Goals - 11/12/19 1924      PT SHORT TERM GOAL #1   Title Be independent with initial home exercise program for self-management of symptoms.    Baseline Initial HEP provided at IE (10/13/2019);    Time 2    Period Weeks    Status Achieved    Target Date 10/27/19             PT Long Term Goals - 12/15/19 1450      PT LONG TERM GOAL #1   Title Be independent with a long-term home exercise program for self-management of symptoms.    Baseline Initial HEP provided at IE (10/13/2019);    Time 12    Period Weeks    Status Achieved    Target Date --   TARGET DATE FOR ALL LONG TERM GOALS: 01/05/2020     PT LONG TERM GOAL #2   Title Demonstrate improved FOTO score to equal or greater than 75 by visit #9 to demonstrate improvement in overall condition and self-reported functional ability.    Baseline 70 (10/13/2019); 75 (12/15/2019);    Time 12    Period Weeks    Status Achieved      PT LONG TERM GOAL #3   Title Reduce pain with functional activities to equal or less than 1/10 to allow patient to complete usual activities including ADLs, IADLs, and social engagement with less difficulty.    Baseline up to 3/10 (10/13/2019); 1/10 last visit (12/15/2019);    Time 12    Period Weeks    Status Partially Met      PT LONG TERM GOAL #4   Title Be able to demonstrate floor to  waist lift using 25# box for 5 reps with proper form without limitation due to current condition in order to improve ability to lift during usual activities.    Baseline squat with form lacking, reports fear of lifting (10/13/2019);    Time 12    Period Weeks    Status Unable to assess      PT LONG TERM GOAL #5   Title Complete community, work and/or recreational activities without limitation due to current condition.    Baseline Functional Limitations: difficulty bending, lifting, twisting, prolonged sitting, sleeping, yardwork, exercising, increases stress level (10/13/2019);    Time 12    Period Weeks    Status Achieved                 Plan - 12/15/19 1458    Clinical Impression Statement Patient attend 8 physical therapy treatment sessions this episode of care and reported improvements in hip mobility which appeared to be a primary goal of  his. Onset of knee pain did limit his participation in some exercise. Patient expressed being very busy with a lot of stressors. Patient was independent with HEP and appeared to have good understanding of how to apply exercises to meet his goals of improved adductor flexibility. Patient decided to discharge at this time as his back pain has been manageable over the past two weeks away from PT and he would like to focus on his other responsibilities. Agreed he will get a new PT order if his condition worsens in the future and he needs to come back to PT.    Personal Factors and Comorbidities Age;Behavior Pattern;Comorbidity 3+;Sex;Past/Current Experience;Time since onset of injury/illness/exacerbation;Social Background    Comorbidities Relevant past medical history and comorbidities include major pulmonary embolism in 2020 (apparently recovered, no restrictions), hx DVT renal cysts (could not be removed), acid reflux, double hernia sx as a child, right side hernia sx as an adult, left leg fx/surgery, R elbow problems (cortisone shots), L lower leg  comminuted fracture with ORIF (~20 yrs ago).    Examination-Activity Limitations Lift;Bend;Squat;Other;Carry   difficulty bending, lifting, twisting, prolonged sitting, sleeping, yardwork, exercising, increases stress level.   Examination-Participation Restrictions Yard Work;Community Activity;Driving;Interpersonal Relationship    Stability/Clinical Decision Making Evolving/Moderate complexity    Rehab Potential Good    PT Frequency 2x / week    PT Duration 12 weeks    PT Treatment/Interventions ADLs/Self Care Home Management;Biofeedback;Cryotherapy;Moist Heat;Traction;Therapeutic activities;Therapeutic exercise;Neuromuscular re-education;Patient/family education;Manual techniques;Passive range of motion;Dry needling;Joint Manipulations;Spinal Manipulations    PT Next Visit Plan Patient is now discharged from PT to independent HEP    PT [place] Access Code: [email or web address]    Consulted and Agree with Plan of Care Patient           Patient will benefit from skilled therapeutic intervention in order to improve the following deficits and impairments:  Pain, Improper body mechanics, Increased muscle spasms, Decreased activity tolerance, Decreased endurance, Decreased range of motion, Hypomobility, Impaired perceived functional ability, Impaired flexibility  Visit Diagnosis: Chronic bilateral low back pain, unspecified whether sciatica present  Pain in right hip  Pain in left hip  Pain in thoracic spine  Cervicalgia  Other muscle spasm     Problem List Patient Active Problem List   Diagnosis Date Noted  . Special screening for malignant neoplasms, colon   . Other pulmonary embolism with acute cor pulmonale ([place]) 02/11/2019  . GERD (gastroesophageal reflux disease) 02/02/2019  . Primary hypercoagulable state ([place]) 02/02/2019  . Renal cyst 02/02/2019  . At risk for secondary malignancy 02/02/2019  . Pulmonary embolism ([place]) 01/22/2019  . Daytime somnolence  01/19/2019  . SOB (shortness of breath) 01/19/2019  . Family history of ASCVD (arteriosclerotic cardiovascular disease) 07/17/2017  . Hyperlipemia, mixed 05/04/2014    Everlean Alstrom. Graylon Good, PT, DPT 12/15/19, 3:01 PM  [facility] [address] [place], [address] Phone: [phone]   Fax:  [phone]  Name: Jebadiah Imperato MRN: [id number] Date of Birth: Nov 04, 1960

## 2019-12-16 ENCOUNTER — Ambulatory Visit: Payer: Commercial Managed Care - PPO | Admitting: Physical Therapy

## 2019-12-22 DIAGNOSIS — Z1211 Encounter for screening for malignant neoplasm of colon: Secondary | ICD-10-CM | POA: Insufficient documentation

## 2019-12-23 ENCOUNTER — Ambulatory Visit: Payer: Commercial Managed Care - PPO | Admitting: Physical Therapy

## 2019-12-28 ENCOUNTER — Other Ambulatory Visit: Payer: Self-pay

## 2019-12-28 ENCOUNTER — Ambulatory Visit (INDEPENDENT_AMBULATORY_CARE_PROVIDER_SITE_OTHER): Payer: Commercial Managed Care - PPO | Admitting: Vascular Surgery

## 2019-12-28 ENCOUNTER — Encounter (INDEPENDENT_AMBULATORY_CARE_PROVIDER_SITE_OTHER): Payer: Self-pay | Admitting: Vascular Surgery

## 2019-12-28 VITALS — BP 116/72 | HR 63 | Ht 73.0 in | Wt 215.0 lb

## 2019-12-28 DIAGNOSIS — K219 Gastro-esophageal reflux disease without esophagitis: Secondary | ICD-10-CM | POA: Diagnosis not present

## 2019-12-28 DIAGNOSIS — I2602 Saddle embolus of pulmonary artery with acute cor pulmonale: Secondary | ICD-10-CM

## 2019-12-28 DIAGNOSIS — E782 Mixed hyperlipidemia: Secondary | ICD-10-CM | POA: Diagnosis not present

## 2019-12-28 NOTE — Progress Notes (Signed)
MRN : [id number]  Nathan Scott is a 59 y.o. (1961/02/28) male who presents with chief complaint of  Chief Complaint  Patient presents with  . Follow-up    70mo F/U   .  History of Present Illness:   The patient presents to the office for routine follow-up evaluation status post pulmonary thrombectomy.   Thrombectomy was performed on 01/22/2019: Mechanical thrombectomywith the penumbra CAT 12 right upper, middle and lower lobepulmonary arteriesand left lower lobe pulmonary artery.  The presenting symptoms were pleuritic chest pain and profound SOB.Patient is continue to stay active he is remodeling a house at the present time.  He is also continued to play trumpet on a daily basis.  For the most part he states his breathing is reasonably good. Hedenies pleuritic chest pain. Hedenies persisting cough or hemoptysis.  The patient denies significant leg pain and swelling dependency. The patient notes minimal edema in the morning.   No blood per rectum or blood in any sputum. No excessive   Current Meds  Medication Sig  . ALPRAZolam (XANAX) 0.5 MG tablet Take 0.5 mg by mouth at bedtime as needed for sleep.   Marland Kitchen apixaban (ELIQUIS) 5 MG TABS tablet Take 1 tablet (5 mg total) by mouth 2 (two) times daily.  . APPLE CIDER VINEGAR PO Take 3 capsules by mouth daily.   Marland Kitchen aspirin EC 81 MG tablet Take 81 mg by mouth daily.  . cholecalciferol (VITAMIN D) 25 MCG (1000 UT) tablet Take 1,000 Units by mouth daily.  . Chromium Picolinate 800 MCG TABS Take by mouth.  . cyclobenzaprine (FLEXERIL) 10 MG tablet Take 10 mg by mouth daily as needed for muscle spasms.   Marland Kitchen doxycycline (VIBRA-TABS) 100 MG tablet Take by mouth.  . Flaxseed, Linseed, (FLAXSEED OIL) 1200 MG CAPS Take 2,400 mg by mouth daily.  Marland Kitchen glucosamine-chondroitin 500-400 MG tablet Take 1 tablet by mouth daily.   . hydrochlorothiazide (HYDRODIURIL) 25 MG tablet Take 25 mg by mouth daily.   . Magnesium Oxide 500 MG TABS  Take 500 mg by mouth daily.  . Omega-3 Fatty Acids (FISH OIL) 1000 MG CAPS Take 2,000 mg by mouth daily.  Marland Kitchen omeprazole (PRILOSEC) 40 MG capsule Take 40 mg by mouth daily before breakfast.  . pyridOXINE (VITAMIN B-6) 100 MG tablet Take 100 mg by mouth daily.  Marland Kitchen rOPINIRole (REQUIP XL) 2 MG 24 hr tablet Take 2 mg by mouth at bedtime.  . sertraline (ZOLOFT) 50 MG tablet Take 50 mg by mouth at bedtime.  . simvastatin (ZOCOR) 20 MG tablet Take 20 mg by mouth at bedtime.  . triamcinolone (NASACORT ALLERGY 24HR) 55 MCG/ACT AERO nasal inhaler Place 2 sprays into the nose daily.  Marland Kitchen triamcinolone cream (KENALOG) 0.1 % Apply 1 application topically 2 (two) times daily as needed (for itching legs.).  Marland Kitchen Turmeric (QC TUMERIC COMPLEX PO) Take 1,000 mg by mouth.    Past Medical History:  Diagnosis Date  . 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 GERD (gastroesophageal reflux disease)   . Hypertension   . Pulmonary embolus ([place])   . Sleep apnea    no cpap x 8 years    Past Surgical History:  Procedure Laterality Date  . COLONOSCOPY WITH PROPOFOL N/A 02/24/2019   Procedure: COLONOSCOPY WITH PROPOFOL;  Surgeon: Lucilla Lame, MD;  Location: [facility] ENDOSCOPY;  Service: Endoscopy;  Laterality: N/A;  . FRACTURE SURGERY Left    pinning leg fx  . LEFT HEART CATH AND CORONARY ANGIOGRAPHY  Left 01/12/2019   Procedure: LEFT HEART CATH AND CORONARY ANGIOGRAPHY;  Surgeon: Teodoro Spray, MD;  Location: [place] CV LAB;  Service: Cardiovascular;  Laterality: Left;  . NASAL SEPTOPLASTY W/ TURBINOPLASTY Bilateral 02/20/2016   Procedure: NASAL SEPTOPLASTY WITH TURBINATE REDUCTION;  Surgeon: Margaretha Sheffield, MD;  Location: [facility] ORS;  Service: ENT;  Laterality: Bilateral;  . PULMONARY THROMBECTOMY Bilateral 01/22/2019   Procedure: PULMONARY THROMBECTOMY;  Surgeon: Katha Cabal, MD;  Location: [place] CV LAB;  Service: Cardiovascular;  Laterality: Bilateral;    Social History Social History   Tobacco Use  . Smoking  status: Never Smoker  . Smokeless tobacco: Never Used  Vaping Use  . Vaping Use: Never used  Substance Use Topics  . Alcohol use: No  . Drug use: No    Family History Family History  Problem Relation Age of Onset  . Heart attack Father   . Clotting disorder Paternal Grandmother     No Known Allergies   REVIEW OF SYSTEMS (Negative unless checked)  Constitutional: [] Weight loss  [] Fever  [] Chills Cardiac: [] Chest pain   [] Chest pressure   [] Palpitations   [] Shortness of breath when laying flat   [] Shortness of breath with exertion. Vascular:  [] Pain in legs with walking   [] Pain in legs at rest  [x] History of DVT   [] Phlebitis   [] Swelling in legs   [] Varicose veins   [] Non-healing ulcers Pulmonary:   [] Uses home oxygen   [] Productive cough   [] Hemoptysis   [] Wheeze  [] COPD   [] Asthma Neurologic:  [] Dizziness   [] Seizures   [] History of stroke   [] History of TIA  [] Aphasia   [] Vissual changes   [] Weakness or numbness in arm   [] Weakness or numbness in leg Musculoskeletal:   [] Joint swelling   [] Joint pain   [] Low back pain Hematologic:  [] Easy bruising  [] Easy bleeding   [] Hypercoagulable state   [] Anemic Gastrointestinal:  [] Diarrhea   [] Vomiting  [x] Gastroesophageal reflux/heartburn   [] Difficulty swallowing. Genitourinary:  [] Chronic kidney disease   [] Difficult urination  [] Frequent urination   [] Blood in urine Skin:  [] Rashes   [] Ulcers  Psychological:  [] History of anxiety   []  History of major depression.  Physical Examination  Vitals:   12/28/19 0944  BP: 116/72  Pulse: 63  Weight: 215 lb (97.5 kg)  Height: 6\' 1"  (1.854 m)   Body mass index is 28.37 kg/m. Gen: WD/WN, NAD Head: [place]/AT, No temporalis wasting.  Ear/Nose/Throat: Hearing grossly intact, nares w/o erythema or drainage Eyes: PER, EOMI, sclera nonicteric.  Neck: Supple, no large masses.   Pulmonary:  Good air movement, no audible wheezing bilaterally, no use of accessory muscles.  Cardiac: RRR, no  JVD Vascular:  Vessel Right Left  Radial Palpable Palpable  Gastrointestinal: Non-distended. No guarding/no peritoneal signs.  Musculoskeletal: M/S 5/5 throughout.  No deformity or atrophy.  Neurologic: CN 2-12 intact. Symmetrical.  Speech is fluent. Motor exam as listed above. Psychiatric: Judgment intact, Mood & affect appropriate for pt's clinical situation. Dermatologic: No rashes or ulcers noted.  No changes consistent with cellulitis.  CBC Lab Results  Component Value Date   WBC 6.7 01/23/2019   HGB 11.6 (L) 01/23/2019   HCT 34.0 (L) 01/23/2019   MCV 87.2 01/23/2019   PLT 126 (L) 01/23/2019    BMET    Component Value Date/Time   NA 136 01/23/2019 0322   NA 140 03/25/2013 1449   K 3.9 01/23/2019 0322   K 3.8 03/25/2013 1449   CL  106 01/23/2019 0322   CL 107 03/25/2013 1449   CO2 25 01/23/2019 0322   CO2 28 03/25/2013 1449   GLUCOSE 114 (H) 01/23/2019 0322   GLUCOSE 84 03/25/2013 1449   BUN 25 (H) 01/23/2019 0322   BUN 18 03/25/2013 1449   CREATININE 0.90 11/02/2019 1111   CREATININE 0.77 03/25/2013 1449   CALCIUM 8.0 (L) 01/23/2019 0322   CALCIUM 9.0 03/25/2013 1449   GFRNONAA >60 01/23/2019 0322   GFRNONAA >60 03/25/2013 1449   GFRAA >60 01/23/2019 0322   GFRAA >60 03/25/2013 1449   CrCl cannot be calculated (Patient's most recent lab result is older than the maximum 21 days allowed.).  COAG Lab Results  Component Value Date   INR 1.1 01/22/2019    Radiology No results found.    Assessment/Plan 1. Acute saddle pulmonary embolism with acute cor pulmonale (HCC) The patient has done extremely well from his pulmonary thrombectomy. His exercise tolerance is excellent.  At the present time he is taking his Eliquis as directed. His hypercoagulable panel is negative to date. He does have a second opinion with [facility] hematology around May 2021 and I have encouraged him to keep this appointment.  In the meantime he will continue taking his Eliquis I have  recommended he take it for 1 year total given his pulmonary emboli. We attempted to look at the [facility] hypercoagulable evaluation and can see that it was drawn but the results are not in care everywhere.  We discussed that if these were negative then after 1 full year of anticoagulation we would change his Eliquis to 2.5 mg p.o. twice daily.  He is going back to see the [facility] hematologist in October and I will see him back in November to make a final decision regarding his Eliquis therapy.  2. Hyperlipemia, mixed Continue statin as ordered and reviewed, no changes at this time   3. Gastroesophageal reflux disease without esophagitis Continue PPI as already ordered, this medication has been reviewed and there are no changes at this time.  Avoidence of caffeine and alcohol  Moderate elevation of the head of the bed    Hortencia Pilar, MD  12/28/2019 10:00 AM

## 2019-12-29 ENCOUNTER — Encounter (INDEPENDENT_AMBULATORY_CARE_PROVIDER_SITE_OTHER): Payer: Self-pay | Admitting: Vascular Surgery

## 2019-12-30 ENCOUNTER — Encounter: Payer: Commercial Managed Care - PPO | Admitting: Physical Therapy

## 2020-01-06 ENCOUNTER — Encounter: Payer: Commercial Managed Care - PPO | Admitting: Physical Therapy

## 2020-01-13 ENCOUNTER — Encounter: Payer: Commercial Managed Care - PPO | Admitting: Physical Therapy

## 2020-01-19 ENCOUNTER — Encounter: Payer: Commercial Managed Care - PPO | Admitting: Physical Therapy

## 2020-01-21 ENCOUNTER — Ambulatory Visit (INDEPENDENT_AMBULATORY_CARE_PROVIDER_SITE_OTHER): Payer: Commercial Managed Care - PPO | Admitting: Vascular Surgery

## 2020-01-21 ENCOUNTER — Encounter: Payer: Commercial Managed Care - PPO | Admitting: Physical Therapy

## 2020-01-22 ENCOUNTER — Other Ambulatory Visit (INDEPENDENT_AMBULATORY_CARE_PROVIDER_SITE_OTHER): Payer: Self-pay | Admitting: Vascular Surgery

## 2020-02-25 ENCOUNTER — Encounter (INDEPENDENT_AMBULATORY_CARE_PROVIDER_SITE_OTHER): Payer: Self-pay

## 2020-02-29 ENCOUNTER — Encounter (INDEPENDENT_AMBULATORY_CARE_PROVIDER_SITE_OTHER): Payer: Self-pay | Admitting: Vascular Surgery

## 2020-02-29 ENCOUNTER — Ambulatory Visit (INDEPENDENT_AMBULATORY_CARE_PROVIDER_SITE_OTHER): Payer: Commercial Managed Care - PPO | Admitting: Vascular Surgery

## 2020-02-29 ENCOUNTER — Other Ambulatory Visit: Payer: Self-pay

## 2020-02-29 VITALS — BP 129/81 | HR 69 | Resp 16 | Wt 214.0 lb

## 2020-02-29 DIAGNOSIS — K219 Gastro-esophageal reflux disease without esophagitis: Secondary | ICD-10-CM

## 2020-02-29 DIAGNOSIS — I2602 Saddle embolus of pulmonary artery with acute cor pulmonale: Secondary | ICD-10-CM

## 2020-02-29 DIAGNOSIS — E782 Mixed hyperlipidemia: Secondary | ICD-10-CM | POA: Diagnosis not present

## 2020-02-29 DIAGNOSIS — D6859 Other primary thrombophilia: Secondary | ICD-10-CM | POA: Diagnosis not present

## 2020-02-29 NOTE — Progress Notes (Signed)
MRN : [id number]  Nathan Scott is a 59 y.o. (1961-02-03) male who presents with chief complaint of No chief complaint on file. Marland Kitchen  History of Present Illness:   The patient presents to the office for routine follow-up evaluation status post pulmonary thrombectomy.   Thrombectomy was performed on 01/22/2019: Mechanical thrombectomywith the penumbra CAT 12 right upper, middle and lower lobepulmonary arteriesand left lower lobe pulmonary artery.  The presenting symptoms were pleuritic chest pain and profound SOB.Patient is continue to stay active he is remodeling a house at the present time.  He is also continued to play trumpet on a daily basis.  For the most part he states his breathing is reasonably good. Hedenies pleuritic chest pain. Hedenies persisting cough or hemoptysis.  The patient denies significant leg pain and swelling dependency. The patient notes minimal edema in the morning.   No blood per rectum or blood in any sputum. No excessive   No outpatient medications have been marked as taking for the 02/29/20 encounter (Appointment) with Delana Meyer, Dolores Lory, MD.    Past Medical History:  Diagnosis Date  . 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 GERD (gastroesophageal reflux disease)   . Hypertension   . Pulmonary embolus ([place])   . Sleep apnea    no cpap x 8 years    Past Surgical History:  Procedure Laterality Date  . COLONOSCOPY WITH PROPOFOL N/A 02/24/2019   Procedure: COLONOSCOPY WITH PROPOFOL;  Surgeon: Lucilla Lame, MD;  Location: [facility] ENDOSCOPY;  Service: Endoscopy;  Laterality: N/A;  . FRACTURE SURGERY Left    pinning leg fx  . LEFT HEART CATH AND CORONARY ANGIOGRAPHY Left 01/12/2019   Procedure: LEFT HEART CATH AND CORONARY ANGIOGRAPHY;  Surgeon: Teodoro Spray, MD;  Location: [place] CV LAB;  Service: Cardiovascular;  Laterality: Left;  . NASAL SEPTOPLASTY W/ TURBINOPLASTY Bilateral 02/20/2016   Procedure: NASAL SEPTOPLASTY WITH TURBINATE  REDUCTION;  Surgeon: Margaretha Sheffield, MD;  Location: [facility] ORS;  Service: ENT;  Laterality: Bilateral;  . PULMONARY THROMBECTOMY Bilateral 01/22/2019   Procedure: PULMONARY THROMBECTOMY;  Surgeon: Katha Cabal, MD;  Location: [place] CV LAB;  Service: Cardiovascular;  Laterality: Bilateral;    Social History Social History   Tobacco Use  . Smoking status: Never Smoker  . Smokeless tobacco: Never Used  Vaping Use  . Vaping Use: Never used  Substance Use Topics  . Alcohol use: No  . Drug use: No    Family History Family History  Problem Relation Age of Onset  . Heart attack Father   . Clotting disorder Paternal Grandmother     No Known Allergies   REVIEW OF SYSTEMS (Negative unless checked)  Constitutional: [] Weight loss  [] Fever  [] Chills Cardiac: [] Chest pain   [] Chest pressure   [] Palpitations   [] Shortness of breath when laying flat   [] Shortness of breath with exertion. Vascular:  [] Pain in legs with walking   [] Pain in legs at rest  [] History of DVT   [] Phlebitis   [] Swelling in legs   [] Varicose veins   [] Non-healing ulcers Pulmonary:   [] Uses home oxygen   [] Productive cough   [] Hemoptysis   [] Wheeze  [] COPD   [] Asthma Neurologic:  [] Dizziness   [] Seizures   [] History of stroke   [] History of TIA  [] Aphasia   [] Vissual changes   [] Weakness or numbness in arm   [] Weakness or numbness in leg Musculoskeletal:   [] Joint swelling   [] Joint pain   [] Low back pain Hematologic:  []   Easy bruising  [] Easy bleeding   [] Hypercoagulable state   [] Anemic Gastrointestinal:  [] Diarrhea   [] Vomiting  [] Gastroesophageal reflux/heartburn   [] Difficulty swallowing. Genitourinary:  [] Chronic kidney disease   [] Difficult urination  [] Frequent urination   [] Blood in urine Skin:  [] Rashes   [] Ulcers  Psychological:  [] History of anxiety   []  History of major depression.  Physical Examination  There were no vitals filed for this visit. There is no height or weight on file to calculate  BMI. Gen: WD/WN, NAD Head: [place]/AT, No temporalis wasting.  Ear/Nose/Throat: Hearing grossly intact, nares w/o erythema or drainage Eyes: PER, EOMI, sclera nonicteric.  Neck: Supple, no large masses.   Pulmonary:  Good air movement, no audible wheezing bilaterally, no use of accessory muscles.  Cardiac: RRR, no JVD Vascular: scattered varicosities present bilaterally.  Mild venous stasis changes to the legs bilaterally.  2+ soft pitting edema. Vessel Right Left  Radial Palpable Palpable  Gastrointestinal: Non-distended. No guarding/no peritoneal signs.  Musculoskeletal: M/S 5/5 throughout.  No deformity or atrophy.  Neurologic: CN 2-12 intact. Symmetrical.  Speech is fluent. Motor exam as listed above. Psychiatric: Judgment intact, Mood & affect appropriate for pt's clinical situation. Dermatologic: No rashes or ulcers noted.  No changes consistent with cellulitis.   CBC Lab Results  Component Value Date   WBC 6.7 01/23/2019   HGB 11.6 (L) 01/23/2019   HCT 34.0 (L) 01/23/2019   MCV 87.2 01/23/2019   PLT 126 (L) 01/23/2019    BMET    Component Value Date/Time   NA 136 01/23/2019 0322   NA 140 03/25/2013 1449   K 3.9 01/23/2019 0322   K 3.8 03/25/2013 1449   CL 106 01/23/2019 0322   CL 107 03/25/2013 1449   CO2 25 01/23/2019 0322   CO2 28 03/25/2013 1449   GLUCOSE 114 (H) 01/23/2019 0322   GLUCOSE 84 03/25/2013 1449   BUN 25 (H) 01/23/2019 0322   BUN 18 03/25/2013 1449   CREATININE 0.90 11/02/2019 1111   CREATININE 0.77 03/25/2013 1449   CALCIUM 8.0 (L) 01/23/2019 0322   CALCIUM 9.0 03/25/2013 1449   GFRNONAA >60 01/23/2019 0322   GFRNONAA >60 03/25/2013 1449   GFRAA >60 01/23/2019 0322   GFRAA >60 03/25/2013 1449   CrCl cannot be calculated (Patient's most recent lab result is older than the maximum 21 days allowed.).  COAG Lab Results  Component Value Date   INR 1.1 01/22/2019    Radiology No results found.   Assessment/Plan 1. Acute saddle pulmonary  embolism with acute cor pulmonale (HCC) The patient has done extremely well from his pulmonary thrombectomy. His exercise tolerance is excellent.  At the present time he is taking his Eliquis as directed. His hypercoagulable panel is negative to date. He does have a second opinion with [facility] hematology around June or July 2022 and I have encouraged him to keep this appointment.  In the meantime he will continue taking his Eliquis until he is done with this prescription. We will then switch him to low-dose (half dose 10 mg/day) of Xarelto indefinitely. We are making the transition from Eliquis to [place] based on a letter he received from his insurance company saying that they would no longer cover Eliquis but that Xarelto was covered under his program.  The [facility] hypercoagulable evaluation is listed in the computer and I can see that it was drawn but the results are not in care everywhere.    2. Hyperlipemia, mixed Continue statin as ordered and reviewed,  no changes at this time   3. Gastroesophageal reflux disease without esophagitis Continue PPI as already ordered, this medication has been reviewed and there are no changes at this time.  Avoidence of caffeine and alcohol  Moderate elevation of the head of the bed   4. Primary hypercoagulable state [facility]) Although the report is that his hypercoagulable panel was negative. This was performed at [facility]. I have been unable to evaluate the results as they do not show up in care everywhere.    Hortencia Pilar, MD  02/29/2020 12:55 PM

## 2020-03-01 ENCOUNTER — Encounter (INDEPENDENT_AMBULATORY_CARE_PROVIDER_SITE_OTHER): Payer: Self-pay | Admitting: Vascular Surgery

## 2020-06-20 ENCOUNTER — Ambulatory Visit (INDEPENDENT_AMBULATORY_CARE_PROVIDER_SITE_OTHER): Payer: Commercial Managed Care - PPO | Admitting: Dermatology

## 2020-06-20 ENCOUNTER — Encounter (INDEPENDENT_AMBULATORY_CARE_PROVIDER_SITE_OTHER): Payer: Self-pay

## 2020-06-20 ENCOUNTER — Other Ambulatory Visit: Payer: Self-pay

## 2020-06-20 DIAGNOSIS — Z1283 Encounter for screening for malignant neoplasm of skin: Secondary | ICD-10-CM

## 2020-06-20 DIAGNOSIS — L821 Other seborrheic keratosis: Secondary | ICD-10-CM

## 2020-06-20 DIAGNOSIS — L814 Other melanin hyperpigmentation: Secondary | ICD-10-CM | POA: Diagnosis not present

## 2020-06-20 DIAGNOSIS — D1800 Hemangioma unspecified site: Secondary | ICD-10-CM

## 2020-06-20 DIAGNOSIS — L309 Dermatitis, unspecified: Secondary | ICD-10-CM | POA: Diagnosis not present

## 2020-06-20 DIAGNOSIS — L578 Other skin changes due to chronic exposure to nonionizing radiation: Secondary | ICD-10-CM

## 2020-06-20 DIAGNOSIS — L820 Inflamed seborrheic keratosis: Secondary | ICD-10-CM

## 2020-06-20 DIAGNOSIS — D229 Melanocytic nevi, unspecified: Secondary | ICD-10-CM

## 2020-06-20 DIAGNOSIS — D2372 Other benign neoplasm of skin of left lower limb, including hip: Secondary | ICD-10-CM

## 2020-06-20 DIAGNOSIS — D2371 Other benign neoplasm of skin of right lower limb, including hip: Secondary | ICD-10-CM

## 2020-06-20 NOTE — Patient Instructions (Signed)
Cryotherapy Aftercare  . Wash gently with soap and water everyday.   . Apply Vaseline and Band-Aid daily until healed.  

## 2020-06-20 NOTE — Progress Notes (Signed)
Follow-Up Visit   Subjective  Nathan Scott is a 60 y.o. male who presents for the following: TBSE (Patient here today for tbse. He reports a itchy spot on left lower leg just below knee. He noticed about a year ago and it wont go away. ). The patient had a pulmonary embolus recently and is on blood thinners.  He had a full work-up and there was no identifiable cause.  He he is going for reevaluation as he has had some more recent shortness of breath. Patient here for full body skin exam and skin cancer screening.  The following portions of the chart were reviewed this encounter and updated as appropriate:  Tobacco  Allergies  Meds  Problems  Med Hx  Surg Hx  Fam Hx      Objective  Well appearing patient in no apparent distress; mood and affect are within normal limits.  A full examination was performed including scalp, head, eyes, ears, nose, lips, neck, chest, axillae, abdomen, back, buttocks, bilateral upper extremities, bilateral lower extremities, hands, feet, fingers, toes, fingernails, and toenails. All findings within normal limits unless otherwise noted below.  Objective  back x 18 (18): Erythematous keratotic or waxy stuck-on papule or plaque.   Objective  bilateral legs: Scaly erythematous papules and patches +/- dyspigmentation, lichenification, excoriations.   Assessment & Plan  Inflamed seborrheic keratosis (18) back x 18 Prior to procedure, discussed risks of blister formation, small wound, skin dyspigmentation, or rare scar following cryotherapy.   Destruction of lesion - back x 18 Complexity: simple   Destruction method: cryotherapy   Informed consent: discussed and consent obtained   Timeout:  patient name, date of birth, surgical site, and procedure verified Lesion destroyed using liquid nitrogen: Yes   Region frozen until ice ball extended beyond lesion: Yes   Outcome: patient tolerated procedure well with no complications   Post-procedure details:  wound care instructions given    Eczema,  bilateral legs Atopic dermatitis  Atopic dermatitis (eczema) is a chronic, relapsing, pruritic condition that can significantly affect quality of life. It is often associated with allergic rhinitis and/or asthma and can require treatment with topical medications, phototherapy, or in severe cases a biologic medication called Dupixent in older children and adults.   Continue using triamcinolone cream as needed to affected areas   Skin cancer screening   Lentigines - Scattered tan macules - Due to sun exposure - Benign-appering, observe - Recommend daily broad spectrum sunscreen SPF 30+ to sun-exposed areas, reapply every 2 hours as needed. - Call for any changes  Seborrheic Keratoses Chest , back,  - Stuck-on, waxy, tan-brown papules and plaques  - Discussed benign etiology and prognosis. - Observe - Call for any changes  Melanocytic Nevi - Tan-brown and/or pink-flesh-colored symmetric macules and papules - Benign appearing on exam today - Observation - Call clinic for new or changing moles - Recommend daily use of broad spectrum spf 30+ sunscreen to sun-exposed areas.   Hemangiomas - Red papules - Discussed benign nature - Observe - Call for any changes  Dermatofibroma Right medial calf , left lateral popliteal 1 x 0.7 cm  - Firm pink/brown papulenodule with dimple sign - Benign appearing - Call for any changes -Discussed surgical option for left lateral popliteal area dermatofibroma due to symptoms.  Patient may consider.  Advised to make sure he has reevaluation with his PCP for his shortness of breath and recent pulmonary embolism before we consider surgery on him.  Actinic Damage - Chronic,  secondary to cumulative UV/sun exposure - diffuse scaly erythematous macules with underlying dyspigmentation - Recommend daily broad spectrum sunscreen SPF 30+ to sun-exposed areas, reapply every 2 hours as needed.  - Call for new or  changing lesions.  Skin cancer screening performed today. Garry Heater, CMA, am acting as scribe for Sarina Ser, MD.  Return in about 1 year (around 06/20/2021) for tbse, .   Documentation: I have reviewed the above documentation for accuracy and completeness, and I agree with the above.  Sarina Ser, MD

## 2020-06-21 ENCOUNTER — Encounter: Payer: Self-pay | Admitting: Dermatology

## 2020-06-23 ENCOUNTER — Other Ambulatory Visit (INDEPENDENT_AMBULATORY_CARE_PROVIDER_SITE_OTHER): Payer: Self-pay | Admitting: Nurse Practitioner

## 2020-06-23 DIAGNOSIS — I2609 Other pulmonary embolism with acute cor pulmonale: Secondary | ICD-10-CM

## 2020-06-23 DIAGNOSIS — I2782 Chronic pulmonary embolism: Secondary | ICD-10-CM

## 2020-06-28 ENCOUNTER — Other Ambulatory Visit: Payer: Self-pay

## 2020-06-28 ENCOUNTER — Ambulatory Visit
Admission: RE | Admit: 2020-06-28 | Discharge: 2020-06-28 | Disposition: A | Discharge status: Home / Self Care | Payer: Commercial Managed Care - PPO | Source: Ambulatory Visit | Attending: Nurse Practitioner | Admitting: Nurse Practitioner

## 2020-06-28 DIAGNOSIS — I2609 Other pulmonary embolism with acute cor pulmonale: Secondary | ICD-10-CM | POA: Diagnosis present

## 2020-06-28 DIAGNOSIS — I2782 Chronic pulmonary embolism: Secondary | ICD-10-CM | POA: Diagnosis present

## 2020-06-28 LAB — POCT I-STAT CREATININE: Creatinine, Ser: 0.9 mg/dL (ref 0.61–1.24)

## 2020-06-28 MED ORDER — IOHEXOL 350 MG/ML SOLN
75.0000 mL | Freq: Once | INTRAVENOUS | Status: AC | PRN
  Administered 2020-06-28: 75 mL via INTRAVENOUS

## 2020-06-28 NOTE — Progress Notes (Signed)
Can we have him come in to see GS for CT review

## 2020-06-28 NOTE — Progress Notes (Signed)
Pt has been scheduled.  °

## 2020-07-04 ENCOUNTER — Encounter (INDEPENDENT_AMBULATORY_CARE_PROVIDER_SITE_OTHER): Payer: Self-pay | Admitting: Vascular Surgery

## 2020-07-04 ENCOUNTER — Ambulatory Visit (INDEPENDENT_AMBULATORY_CARE_PROVIDER_SITE_OTHER): Payer: Commercial Managed Care - PPO | Admitting: Vascular Surgery

## 2020-07-04 ENCOUNTER — Other Ambulatory Visit: Payer: Self-pay

## 2020-07-04 VITALS — BP 138/85 | HR 65 | Resp 16 | Wt 224.6 lb

## 2020-07-04 DIAGNOSIS — I2602 Saddle embolus of pulmonary artery with acute cor pulmonale: Secondary | ICD-10-CM

## 2020-07-04 DIAGNOSIS — E782 Mixed hyperlipidemia: Secondary | ICD-10-CM | POA: Diagnosis not present

## 2020-07-04 DIAGNOSIS — K219 Gastro-esophageal reflux disease without esophagitis: Secondary | ICD-10-CM | POA: Diagnosis not present

## 2020-07-04 NOTE — Progress Notes (Signed)
MRN : [id number]  Ardian Haberland is a 60 y.o. (06/28/1960) male who presents with chief complaint of  Chief Complaint  Patient presents with   Follow-up    Ct results  .  History of Present Illness:   Patient returns to the office for follow-up regarding the CT scan.  CT angiogram of the pulmonary arteries was obtained June 29, 2018.  Study was ordered because the patient has been feeling short of breath particularly with exertion.  Today his symptoms are about the same or slightly improved.  He denies hemoptysis.  He denies productive cough.  He denies pleuritic chest pain.  The CT angiogram of the chest is reviewed by me personally with the patient and his wife.  It shows normal pulmonary vasculature.  It also documents coronary artery calcifications.  In review of a former CT this is a known diagnosis.  In the end this was his primary concern and was the topic of much of the discussion.  Current Meds  Medication Sig   ALPRAZolam (XANAX) 0.5 MG tablet Take 0.5 mg by mouth at bedtime as needed for sleep.    APPLE CIDER VINEGAR PO Take 3 capsules by mouth daily.    aspirin EC 81 MG tablet Take 81 mg by mouth daily.   cholecalciferol (VITAMIN D) 25 MCG (1000 UT) tablet Take 1,000 Units by mouth daily.   Chromium Picolinate 800 MCG TABS Take by mouth.   cyclobenzaprine (FLEXERIL) 10 MG tablet Take 10 mg by mouth daily as needed for muscle spasms.    ELIQUIS 5 MG TABS tablet TAKE 1 TABLET BY MOUTH TWICE A DAY (Patient taking differently: 2.5 mg.)   Flaxseed, Linseed, (FLAXSEED OIL) 1200 MG CAPS Take 2,400 mg by mouth daily.   glucosamine-chondroitin 500-400 MG tablet Take 1 tablet by mouth daily.    hydrochlorothiazide (HYDRODIURIL) 25 MG tablet Take 25 mg by mouth daily.    Magnesium Oxide 500 MG TABS Take 500 mg by mouth daily.   Omega-3 Fatty Acids (FISH OIL) 1000 MG CAPS Take 2,000 mg by mouth daily.   omeprazole (PRILOSEC) 40 MG capsule Take 40 mg by mouth daily  before breakfast.   pyridOXINE (VITAMIN B-6) 100 MG tablet Take 100 mg by mouth daily.   sertraline (ZOLOFT) 50 MG tablet Take 50 mg by mouth at bedtime.   simvastatin (ZOCOR) 20 MG tablet Take 20 mg by mouth at bedtime.   temazepam (RESTORIL) 15 MG capsule Take 15 mg by mouth at bedtime as needed.   triamcinolone (NASACORT) 55 MCG/ACT AERO nasal inhaler Place 2 sprays into the nose daily.   triamcinolone cream (KENALOG) 0.1 % Apply 1 application topically 2 (two) times daily as needed (for itching legs.).   Turmeric (QC TUMERIC COMPLEX PO) Take 1,000 mg by mouth.   venlafaxine XR (EFFEXOR-XR) 37.5 MG 24 hr capsule Take 1 capsule by mouth daily.    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   GERD (gastroesophageal reflux disease)    Hypertension    Pulmonary embolus (HCC)    Sleep apnea    no cpap x 8 years    Past Surgical History:  Procedure Laterality Date   COLONOSCOPY WITH PROPOFOL N/A 02/24/2019   Procedure: COLONOSCOPY WITH PROPOFOL;  Surgeon: Lucilla Lame, MD;  Location: [facility];  Service: Endoscopy;  Laterality: N/A;   FRACTURE SURGERY Left    pinning leg fx   LEFT HEART CATH AND CORONARY ANGIOGRAPHY Left  01/12/2019   Procedure: LEFT HEART CATH AND CORONARY ANGIOGRAPHY;  Surgeon: Teodoro Spray, MD;  Location: [facility];  Service: Cardiovascular;  Laterality: Left;   NASAL SEPTOPLASTY W/ TURBINOPLASTY Bilateral 02/20/2016   Procedure: NASAL SEPTOPLASTY WITH TURBINATE REDUCTION;  Surgeon: Margaretha Sheffield, MD;  Location: [facility];  Service: ENT;  Laterality: Bilateral;   PULMONARY THROMBECTOMY Bilateral 01/22/2019   Procedure: PULMONARY THROMBECTOMY;  Surgeon: Katha Cabal, MD;  Location: [facility];  Service: Cardiovascular;  Laterality: Bilateral;    Social History Social History   Tobacco Use   Smoking status: Never Smoker   Smokeless tobacco: Never Used  Scientific laboratory technician Use: Never used   Substance Use Topics   Alcohol use: No   Drug use: No    Family History Family History  Problem Relation Age of Onset   Heart attack Father    Clotting disorder Paternal Grandmother     No Known Allergies   REVIEW OF SYSTEMS (Negative unless checked)  Constitutional: [] Weight loss  [] Fever  [] Chills Cardiac: [] Chest pain   [] Chest pressure   [] Palpitations   [] Shortness of breath when laying flat   [x] Shortness of breath with exertion. Vascular:  [] Pain in legs with walking   [] Pain in legs at rest  [x] History of DVT   [] Phlebitis   [] Swelling in legs   [] Varicose veins   [] Non-healing ulcers Pulmonary:   [] Uses home oxygen   [] Productive cough   [] Hemoptysis   [] Wheeze  [] COPD   [] Asthma Neurologic:  [] Dizziness   [] Seizures   [] History of stroke   [] History of TIA  [] Aphasia   [] Vissual changes   [] Weakness or numbness in arm   [] Weakness or numbness in leg Musculoskeletal:   [] Joint swelling   [] Joint pain   [] Low back pain Hematologic:  [] Easy bruising  [] Easy bleeding   [] Hypercoagulable state   [] Anemic Gastrointestinal:  [] Diarrhea   [] Vomiting  [] Gastroesophageal reflux/heartburn   [] Difficulty swallowing. Genitourinary:  [] Chronic kidney disease   [] Difficult urination  [] Frequent urination   [] Blood in urine Skin:  [] Rashes   [] Ulcers  Psychological:  [] History of anxiety   []  History of major depression.  Physical Examination  Vitals:   07/04/20 1307  BP: 138/85  Pulse: 65  Resp: 16  Weight: 224 lb 9.6 oz (101.9 kg)   Body mass index is 29.63 kg/m. Gen: WD/WN, NAD Head: [place]/AT, No temporalis wasting.  Ear/Nose/Throat: Hearing grossly intact, nares w/o erythema or drainage Eyes: PER, EOMI, sclera nonicteric.  Neck: Supple, no large masses.   Pulmonary:  Good air movement, no audible wheezing bilaterally, no use of accessory muscles.  Cardiac: RRR, no JVD Vascular:  Vessel Right Left  Radial Palpable Palpable  Gastrointestinal: Non-distended. No  guarding/no peritoneal signs.  Musculoskeletal: M/S 5/5 throughout.  No deformity or atrophy.  Neurologic: CN 2-12 intact. Symmetrical.  Speech is fluent. Motor exam as listed above. Psychiatric: Judgment intact, Mood & affect appropriate for pt's clinical situation. Dermatologic: No rashes or ulcers noted.  No changes consistent with cellulitis. Lymph : No lichenification or skin changes of chronic lymphedema.  CBC Lab Results  Component Value Date   WBC 6.7 01/23/2019   HGB 11.6 (L) 01/23/2019   HCT 34.0 (L) 01/23/2019   MCV 87.2 01/23/2019   PLT 126 (L) 01/23/2019    BMET    Component Value Date/Time   NA 136 01/23/2019 0322   NA 140 03/25/2013 1449   K 3.9 01/23/2019 0322  K 3.8 03/25/2013 1449   CL 106 01/23/2019 0322   CL 107 03/25/2013 1449   CO2 25 01/23/2019 0322   CO2 28 03/25/2013 1449   GLUCOSE 114 (H) 01/23/2019 0322   GLUCOSE 84 03/25/2013 1449   BUN 25 (H) 01/23/2019 0322   BUN 18 03/25/2013 1449   CREATININE 0.90 06/28/2020 0920   CREATININE 0.77 03/25/2013 1449   CALCIUM 8.0 (L) 01/23/2019 0322   CALCIUM 9.0 03/25/2013 1449   GFRNONAA >60 01/23/2019 0322   GFRNONAA >60 03/25/2013 1449   GFRAA >60 01/23/2019 0322   GFRAA >60 03/25/2013 1449   CrCl cannot be calculated (Unknown ideal weight.).  COAG Lab Results  Component Value Date   INR 1.1 01/22/2019    Radiology CT ANGIO CHEST PE W OR WO CONTRAST  Result Date: 06/28/2020 CLINICAL DATA:  Dyspnea, positive D-dimer level. EXAM: CT ANGIOGRAPHY CHEST WITH CONTRAST TECHNIQUE: Multidetector CT imaging of the chest was performed using the standard protocol during bolus administration of intravenous contrast. Multiplanar CT image reconstructions and MIPs were obtained to evaluate the vascular anatomy. CONTRAST:  83mL OMNIPAQUE IOHEXOL 350 MG/ML SOLN COMPARISON:  January 22, 2019. FINDINGS: Cardiovascular: Satisfactory opacification of the pulmonary arteries to the segmental level. No evidence of pulmonary  embolism. Normal heart size. No pericardial effusion. Mild coronary artery calcifications are noted. Mediastinum/Nodes: No enlarged mediastinal, hilar, or axillary lymph nodes. Thyroid gland, trachea, and esophagus demonstrate no significant findings. Lungs/Pleura: Lungs are clear. No pleural effusion or pneumothorax. Upper Abdomen: No acute abnormality. Musculoskeletal: No chest wall abnormality. No acute or significant osseous findings. Review of the MIP images confirms the above findings. IMPRESSION: 1. No definite evidence of pulmonary embolus. 2. Mild coronary artery calcifications are noted suggesting coronary artery disease. 3. No other abnormality seen in the chest. Electronically Signed   By: Marijo Conception M.D.   On: 06/28/2020 09:36     Assessment/Plan 1. Acute saddle pulmonary embolism with acute cor pulmonale (HCC) The patient has done extremely well from his pulmonary thrombectomy. By his report his exercise tolerance is quite good.  The CT angiogram of the chest is reviewed by me personally with the patient and his wife.  It shows normal pulmonary vasculature.  It also documents coronary artery calcifications.  In review of a former CT this is a known diagnosis.  In the end this was his primary concern and was the topic of much of the discussion.  I have suggested he follow-up with pulmonology regarding any persistent symptoms as these would more likely be intrinsic pulmonary disease and not related to persistent occlusive thrombus or scarring given the findings on the CT angiogram.  I have also asked that he follow-up with Dr. Ubaldo Glassing regarding his coronary calcifications as this is not my area of expertise.  At the present time he is taking his Eliquis as directed. His hypercoagulable panel is negative to date. He does have a second opinion with [facility] around June or July 2022 and I have encouraged him to keep this appointment.  In the meantime he will continue taking his  Eliquis until he is done with this prescription. We will then switch him to low-dose (half dose 10 mg/day) of Xarelto indefinitely. We are making the transition from Eliquis to [place] based on a letter he received from his insurance company saying that they would no longer cover Eliquis but that Xarelto was covered under his program.  [facility] is listed in the computer and I can see that it  was drawn but the results are not in care everywhere.  2. Hyperlipemia, mixed Continue statin as ordered and reviewed, no changes at this time   3. Gastroesophageal reflux disease without esophagitis Continue PPI as already ordered, this medication has been reviewed and there are no changes at this time.  Avoidence of caffeine and alcohol  Moderate elevation of the head of the bed    Hortencia Pilar, MD  07/04/2020 1:08 PM

## 2020-07-07 ENCOUNTER — Encounter (INDEPENDENT_AMBULATORY_CARE_PROVIDER_SITE_OTHER): Payer: Self-pay | Admitting: Vascular Surgery

## 2020-07-26 ENCOUNTER — Ambulatory Visit (INDEPENDENT_AMBULATORY_CARE_PROVIDER_SITE_OTHER): Payer: Commercial Managed Care - PPO | Admitting: Dermatology

## 2020-07-26 ENCOUNTER — Other Ambulatory Visit: Payer: Self-pay

## 2020-07-26 ENCOUNTER — Encounter: Payer: Self-pay | Admitting: Dermatology

## 2020-07-26 DIAGNOSIS — D2372 Other benign neoplasm of skin of left lower limb, including hip: Secondary | ICD-10-CM

## 2020-07-26 DIAGNOSIS — D492 Neoplasm of unspecified behavior of bone, soft tissue, and skin: Secondary | ICD-10-CM

## 2020-07-26 MED ORDER — MUPIROCIN 2 % EX OINT: 1.0000 "application " | TOPICAL_OINTMENT | Freq: Every day | CUTANEOUS | 1 refills | Status: DC

## 2020-07-26 NOTE — Patient Instructions (Signed)

## 2020-07-26 NOTE — Progress Notes (Signed)
   Follow-Up Visit   Subjective  Nathan Scott is a 60 y.o. male who presents for the following: Irritated dermatofibroma (L lat popliteal, pt presents for excision).  The following portions of the chart were reviewed this encounter and updated as appropriate:   Tobacco  Allergies  Meds  Problems  Med Hx  Surg Hx  Fam Hx     Review of Systems:  No other skin or systemic complaints except as noted in HPI or Assessment and Plan.  Objective  Well appearing patient in no apparent distress; mood and affect are within normal limits.  A focused examination was performed including L leg. Relevant physical exam findings are noted in the Assessment and Plan.  Objective  Left Lateral Popliteal: Firm pink brown papulenodule with dimple sign 2.2 x 1.1cm   Assessment & Plan  Neoplasm of skin Left Lateral Popliteal  Skin excision  Lesion length (cm):  2.2 Lesion width (cm):  1.1 Margin per side (cm):  0.2 Total excision diameter (cm):  2.6 Informed consent: discussed and consent obtained   Timeout: patient name, date of birth, surgical site, and procedure verified   Procedure prep:  Patient was prepped and draped in usual sterile fashion Prep type:  Isopropyl alcohol and povidone-iodine Anesthesia: the lesion was anesthetized in a standard fashion   Anesthesia comment:  6.0cc Anesthetic:  1% lidocaine w/ epinephrine 1-100,000 buffered w/ 8.4% NaHCO3 (0.5% bupivicaine buffered w/ 8.4% NaHC03) Instrument used: #15 blade   Hemostasis achieved with: pressure   Hemostasis achieved with comment:  Electrocautery Outcome: patient tolerated procedure well with no complications   Post-procedure details: sterile dressing applied and wound care instructions given   Dressing type: bandage, pressure dressing and bacitracin (Mupirocin)    Skin repair Complexity:  Complex Final length (cm):  2.7 Reason for type of repair: reduce tension to allow closure, reduce the risk of dehiscence,  infection, and necrosis, reduce subcutaneous dead space and avoid a hematoma, allow closure of the large defect, preserve normal anatomy, preserve normal anatomical and functional relationships and enhance both functionality and cosmetic results   Undermining: area extensively undermined   Undermining comment:  Undermining Defect 2.6cm Subcutaneous layers (deep stitches):  Suture size:  3-0 Suture type: Vicryl (polyglactin 910)   Subcutaneous suture technique: Inverted Dermal. Fine/surface layer approximation (top stitches):  Suture size:  3-0 Suture type: nylon   Stitches: horizontal mattress   Suture removal (days):  7 Hemostasis achieved with: pressure Outcome: patient tolerated procedure well with no complications   Post-procedure details: sterile dressing applied and wound care instructions given   Dressing type: bandage, pressure dressing and bacitracin (Mupirocin)    mupirocin ointment (BACTROBAN) 2 %  Specimen 1 - Surgical pathology Differential Diagnosis: D48.5 Irritated Dermatofibroma vs other Check Margins: No Firm pink brown papulenodule with dimple sign 2.2 x 1.1cm  Irritated Dermatofibroma vs other, excised today  Start Mupirocin oint qd to excision site  Return in about 1 week (around 08/02/2020) for suture removal, and check spot R scalp.   I, Othelia Pulling, RMA, am acting as scribe for Sarina Ser, MD .  Documentation: I have reviewed the above documentation for accuracy and completeness, and I agree with the above.  Sarina Ser, MD

## 2020-07-27 ENCOUNTER — Telehealth: Payer: Self-pay

## 2020-07-27 NOTE — Telephone Encounter (Signed)
Patient doing well after yesterdays surgery./sh 

## 2020-08-02 ENCOUNTER — Other Ambulatory Visit: Payer: Self-pay

## 2020-08-02 ENCOUNTER — Ambulatory Visit (INDEPENDENT_AMBULATORY_CARE_PROVIDER_SITE_OTHER): Payer: Commercial Managed Care - PPO | Admitting: Dermatology

## 2020-08-02 DIAGNOSIS — D2372 Other benign neoplasm of skin of left lower limb, including hip: Secondary | ICD-10-CM

## 2020-08-02 DIAGNOSIS — L820 Inflamed seborrheic keratosis: Secondary | ICD-10-CM

## 2020-08-02 DIAGNOSIS — D485 Neoplasm of uncertain behavior of skin: Secondary | ICD-10-CM

## 2020-08-02 DIAGNOSIS — D239 Other benign neoplasm of skin, unspecified: Secondary | ICD-10-CM

## 2020-08-02 NOTE — Progress Notes (Signed)
   Follow-Up Visit   Subjective  Nathan Scott is a 60 y.o. male who presents for the following: suture removal/post op (Patient is here today for suture removal of a dermatofibroma of the L lat popliteal). He has also noticed a lesion on his right scalp that he would like checked today.  The following portions of the chart were reviewed this encounter and updated as appropriate:   Tobacco  Allergies  Meds  Problems  Med Hx  Surg Hx  Fam Hx      Review of Systems:  No other skin or systemic complaints except as noted in HPI or Assessment and Plan.  Objective  Well appearing patient in no apparent distress; mood and affect are within normal limits.  A focused examination was performed including the scalp and L leg. Relevant physical exam findings are noted in the Assessment and Plan.  Objective  L lat popliteal: Healing excision site.  Objective  R scalp/sideburn: 0.6 cm crusted papule   Assessment & Plan  Dermatofibroma L lat popliteal  Encounter for Removal of Sutures - Incision site at the L lat popliteal is clean, dry and intact - Wound cleansed, sutures removed, wound cleansed and steri strips applied.  - Discussed pathology results showing a benign dermatofibroma. - Patient advised to keep steri-strips dry until they fall off. - Scars remodel for a full year. - Once steri-strips fall off, patient can apply over-the-counter silicone scar cream each night to help with scar remodeling if desired. - Patient advised to call with any concerns or if they notice any new or changing lesions.   Neoplasm of uncertain behavior of skin R sideburn  Epidermal / dermal shaving  Lesion diameter (cm):  0.6 Informed consent: discussed and consent obtained   Timeout: patient name, date of birth, surgical site, and procedure verified   Procedure prep:  Patient was prepped and draped in usual sterile fashion Prep type:  Isopropyl alcohol Anesthesia: the lesion was anesthetized  in a standard fashion   Anesthetic:  1% lidocaine w/ epinephrine 1-100,000 buffered w/ 8.4% NaHCO3 Instrument used: flexible razor blade   Hemostasis achieved with: pressure, aluminum chloride and electrodesiccation   Outcome: patient tolerated procedure well   Post-procedure details: sterile dressing applied and wound care instructions given   Dressing type: bandage and petrolatum    Specimen 1 - Surgical pathology Differential Diagnosis: D48.5 r/o BCC vs SCC vs other  Check Margins: No 0.6 cm crusted papule  Return for appointment as scheduled.  Luther Redo, CMA, am acting as scribe for Sarina Ser, MD .  Documentation: I have reviewed the above documentation for accuracy and completeness, and I agree with the above.  Sarina Ser, MD

## 2020-08-02 NOTE — Patient Instructions (Signed)

## 2020-08-04 ENCOUNTER — Encounter: Payer: Self-pay | Admitting: Dermatology

## 2020-08-08 ENCOUNTER — Telehealth: Payer: Self-pay

## 2020-08-08 NOTE — Telephone Encounter (Signed)
-----   Message from Ralene Bathe, MD sent at 08/05/2020  4:32 PM EDT ----- Diagnosis Skin , right scalp/ sideburn, shave SEBORRHEIC KERATOSIS, INFLAMED  Benign inflamed keratosis No further treatment needed

## 2020-08-08 NOTE — Telephone Encounter (Signed)
Patient informed of pathology results 

## 2020-08-25 DIAGNOSIS — M79645 Pain in left finger(s): Secondary | ICD-10-CM | POA: Insufficient documentation

## 2020-08-26 DIAGNOSIS — R200 Anesthesia of skin: Secondary | ICD-10-CM | POA: Insufficient documentation

## 2020-10-04 ENCOUNTER — Telehealth (INDEPENDENT_AMBULATORY_CARE_PROVIDER_SITE_OTHER): Payer: Self-pay

## 2020-10-04 ENCOUNTER — Encounter (INDEPENDENT_AMBULATORY_CARE_PROVIDER_SITE_OTHER): Payer: Self-pay

## 2020-10-04 ENCOUNTER — Other Ambulatory Visit (INDEPENDENT_AMBULATORY_CARE_PROVIDER_SITE_OTHER): Payer: Self-pay | Admitting: Nurse Practitioner

## 2020-10-04 MED ORDER — RIVAROXABAN 10 MG PO TABS: 10.0000 mg | ORAL_TABLET | Freq: Every day | ORAL | 11 refills | Status: DC

## 2020-10-04 NOTE — Telephone Encounter (Signed)
The pt called an left a VM on the the  nurses line wanting to know could we send in his rx elliquis since he has switched from [facility] an has finished the Rx.  I called the pt  and made him aware that the medication has  been sent over to his pharmacy

## 2020-10-20 ENCOUNTER — Other Ambulatory Visit: Payer: Self-pay

## 2020-10-20 ENCOUNTER — Ambulatory Visit (INDEPENDENT_AMBULATORY_CARE_PROVIDER_SITE_OTHER): Payer: Commercial Managed Care - PPO | Admitting: Dermatology

## 2020-10-20 DIAGNOSIS — L821 Other seborrheic keratosis: Secondary | ICD-10-CM | POA: Diagnosis not present

## 2020-10-20 DIAGNOSIS — L820 Inflamed seborrheic keratosis: Secondary | ICD-10-CM

## 2020-10-20 NOTE — Patient Instructions (Signed)
Cryotherapy Aftercare  Wash gently with soap and water everyday.   Apply Vaseline and Band-Aid daily until healed.   Prior to procedure, discussed risks of blister formation, small wound, skin dyspigmentation, or rare scar following cryotherapy. Recommend Vaseline ointment to treated areas while healing.  Recommend daily broad spectrum sunscreen SPF 30+ to sun-exposed areas, reapply every 2 hours as needed. Call for new or changing lesions.  Staying in the shade or wearing long sleeves, sun glasses (UVA+UVB protection) and wide brim hats (4-inch brim around the entire circumference of the hat) are also recommended for sun protection.   Recommend taking Heliocare sun protection supplement daily in sunny weather for additional sun protection. For maximum protection on the sunniest days, you can take up to 2 capsules of regular Heliocare OR take 1 capsule of Heliocare Ultra. For prolonged exposure (such as a full day in the sun), you can repeat your dose of the supplement 4 hours after your first dose. Heliocare can be purchased at [facility] or at [email or web address].com.    If you have any questions or concerns for your doctor, please call our main line at [phone] and press option 4 to reach your doctor's medical assistant. If no one answers, please leave a voicemail as directed and we will return your call as soon as possible. Messages left after 4 pm will be answered the following business day.   You may also send us a message via MyChart. We typically respond to MyChart messages within 1-2 business days.  For prescription refills, please ask your pharmacy to contact our office. Our fax number is [phone].  If you have an urgent issue when the clinic is closed that cannot wait until the next business day, you can page your doctor at the number below.    Please note that while we do our best to be available for urgent issues outside of office hours, we are not available 24/7.   If  you have an urgent issue and are unable to reach us, you may choose to seek medical care at your doctor's office, retail clinic, urgent care center, or emergency room.  If you have a medical emergency, please immediately call 911 or go to the emergency department.  Pager Numbers  - Dr. Kowalski: [phone]  - Dr. Moye: [phone]  - Dr. Stewart: [phone]  In the event of inclement weather, please call our main line at [phone] for an update on the status of any delays or closures.  Dermatology Medication Tips: Please keep the boxes that topical medications come in in order to help keep track of the instructions about where and how to use these. Pharmacies typically print the medication instructions only on the boxes and not directly on the medication tubes.   If your medication is too expensive, please contact our office at [phone] option 4 or send us a message through MyChart.   We are unable to tell what your co-pay for medications will be in advance as this is different depending on your insurance coverage. However, we may be able to find a substitute medication at lower cost or fill out paperwork to get insurance to cover a needed medication.   If a prior authorization is required to get your medication covered by your insurance company, please allow us 1-2 business days to complete this process.  Drug prices often vary depending on where the prescription is filled and some pharmacies may offer cheaper prices.  The website www.goodrx.com contains coupons for medications through   different pharmacies. The prices here do not account for what the cost may be with help from insurance (it may be cheaper with your insurance), but the website can give you the price if you did not use any insurance.  - You can print the associated coupon and take it with your prescription to the pharmacy.  - You may also stop by our office during regular business hours and pick up a GoodRx  coupon card.  - If you need your prescription sent electronically to a different pharmacy, notify our office through [facility] MyChart or by phone at [phone] option 4.  

## 2020-10-20 NOTE — Progress Notes (Signed)
   Follow-Up Visit   Subjective  Nathan Scott is a 60 y.o. male who presents for the following: Skin Problem (Patient here today for a spot at right scalp/hairline. It's been there for quite a while but has gotten irritated. ).  The following portions of the chart were reviewed this encounter and updated as appropriate:   Tobacco  Allergies  Meds  Problems  Med Hx  Surg Hx  Fam Hx       Review of Systems:  No other skin or systemic complaints except as noted in HPI or Assessment and Plan.  Objective  Well appearing patient in no apparent distress; mood and affect are within normal limits.  A focused examination was performed including face, scalp. Relevant physical exam findings are noted in the Assessment and Plan.  Right Temple Erythematous keratotic or waxy stuck-on papule or plaque.    Assessment & Plan  Inflamed seborrheic keratosis Right Temple  Symptomatic   prior to procedure, discussed risks of blister formation, small wound, skin dyspigmentation, or rare scar following cryotherapy. Recommend Vaseline ointment to treated areas while healing.   Destruction of lesion - Right Temple  Destruction method: cryotherapy   Informed consent: discussed and consent obtained   Lesion destroyed using liquid nitrogen: Yes   Cryotherapy cycles:  2 Outcome: patient tolerated procedure well with no complications   Post-procedure details: wound care instructions given    Seborrheic Keratoses - Stuck-on, waxy, tan-brown papules and/or plaques  - Benign-appearing - Discussed benign etiology and prognosis. - Observe - Call for any changes  Return for TBSE, as scheduled.  Graciella Belton, RMA, am acting as scribe for Forest Gleason, MD .  Documentation: I have reviewed the above documentation for accuracy and completeness, and I agree with the above.  Forest Gleason, MD

## 2020-10-26 ENCOUNTER — Encounter: Payer: Self-pay | Admitting: Dermatology

## 2020-10-30 IMAGING — CT CT ANGIO CHEST
2 of 6 series · 18 of 46 positions shown · IV contrast (omnipaque)
Comparison: 01/22/2019 chest radiograph

CLINICAL DATA: 58-year-old male with shortness of breath and
syncope.

EXAM:
CT ANGIOGRAPHY CHEST WITH CONTRAST
TECHNIQUE: Multidetector CT imaging of the chest was performed using the
standard protocol during bolus administration of intravenous
contrast. Multiplanar CT image reconstructions and MIPs were
obtained to evaluate the vascular anatomy.
CONTRAST:  75mL OMNIPAQUE IOHEXOL 350 MG/ML SOLN

[Series 5: thins · axial · 0.77mm/px · z∈[-697,-447]mm · 16 of 274 slices shown]
[im 12/274  lung]
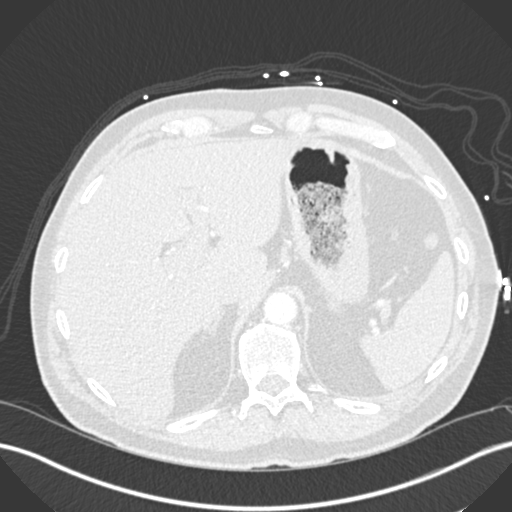
[im 36/274  soft-tissue]
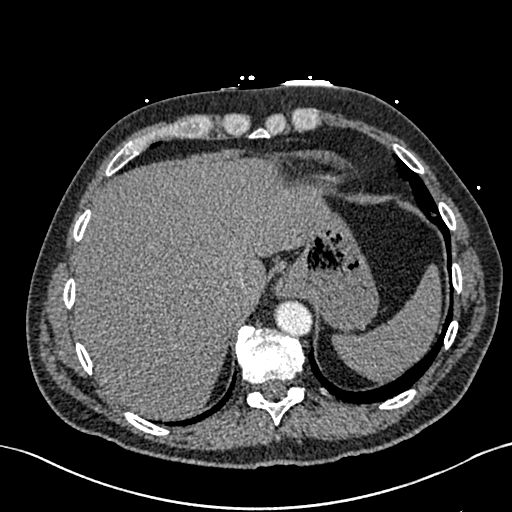
[im 48/274  lung]
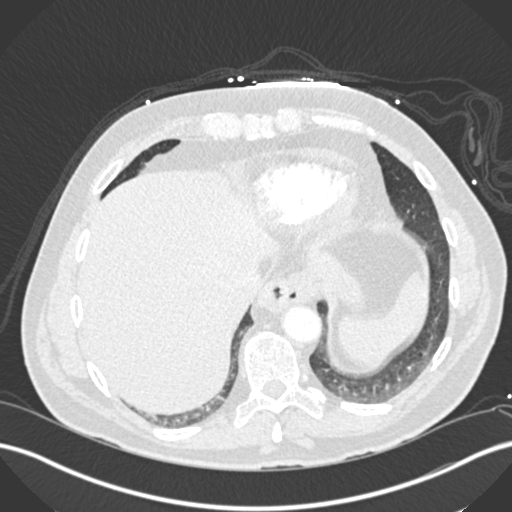
[im 60/274  soft-tissue]
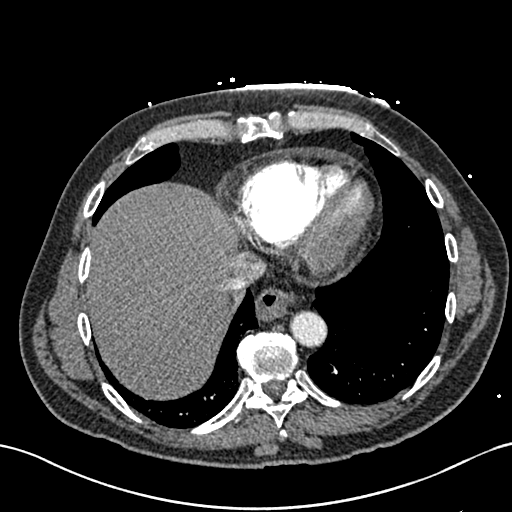
[im 84/274  lung]
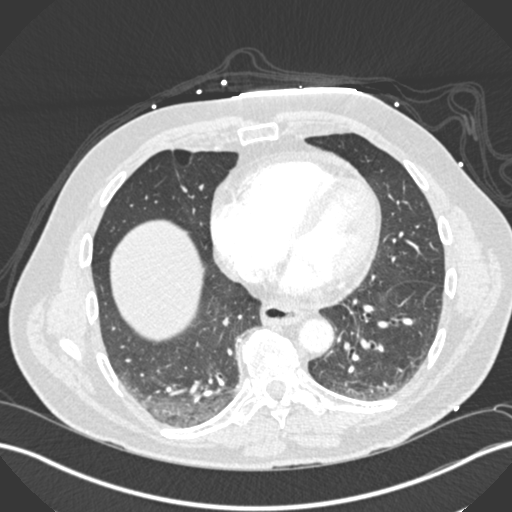
[im 95/274  soft-tissue]
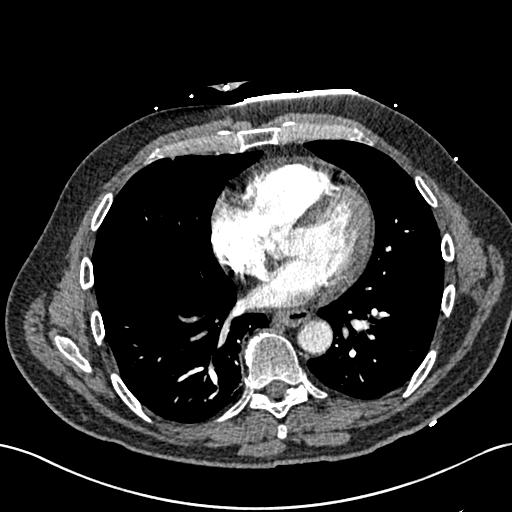
[im 107/274  lung]
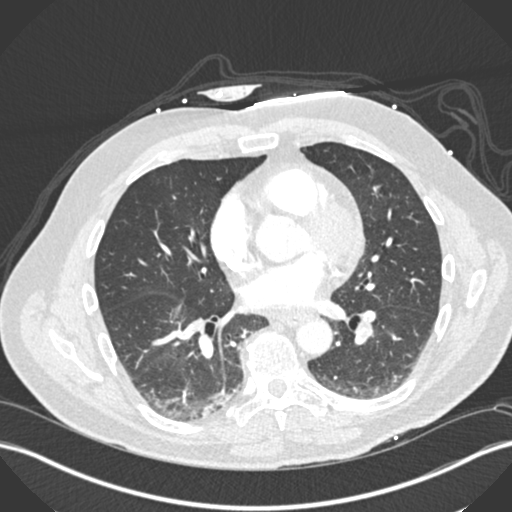
[im 131/274  soft-tissue]
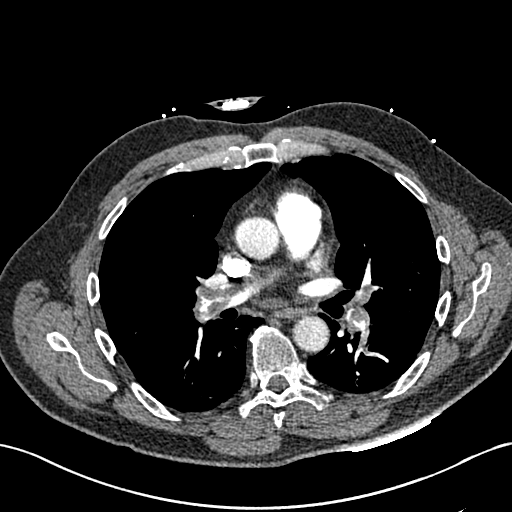
[im 143/274  lung]
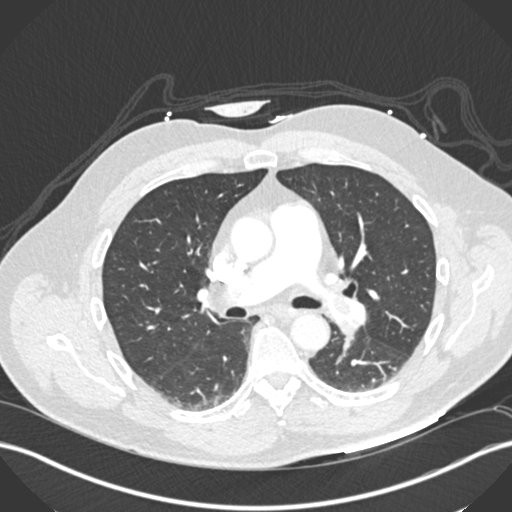
[im 167/274  soft-tissue]
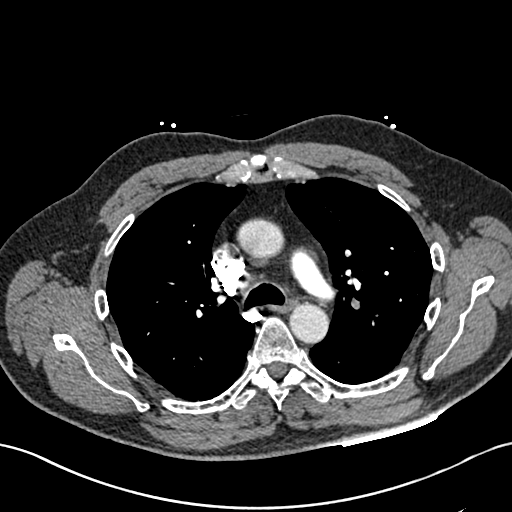
[im 179/274  lung]
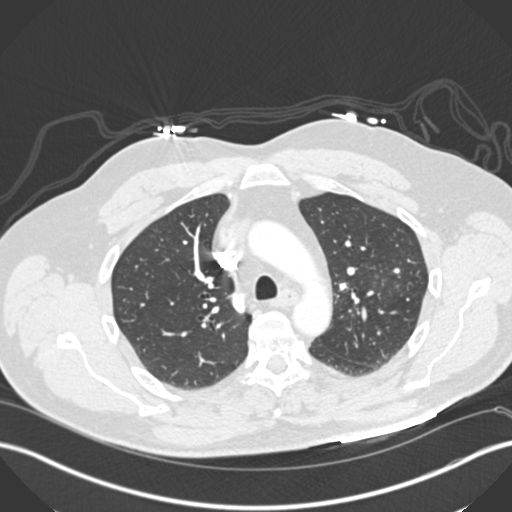
[im 190/274  soft-tissue]
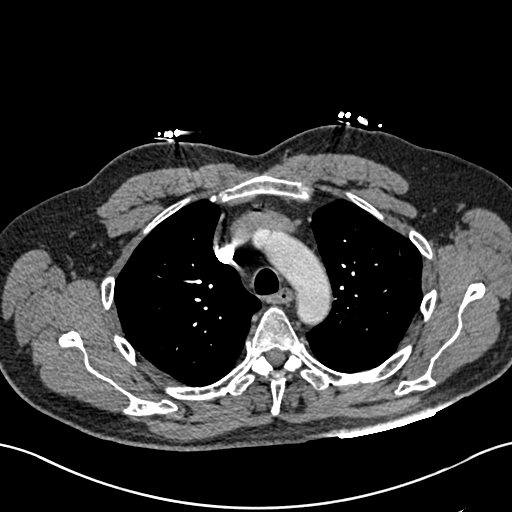
[im 214/274  lung]
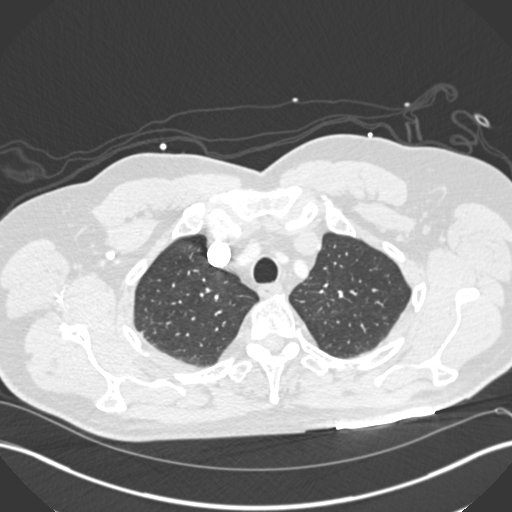
[im 226/274  soft-tissue]
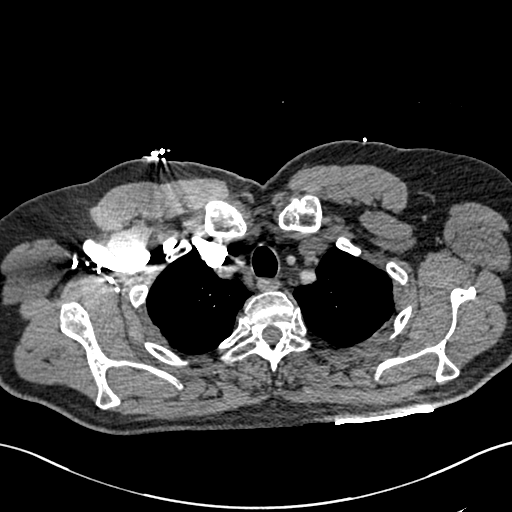
[im 238/274  lung]
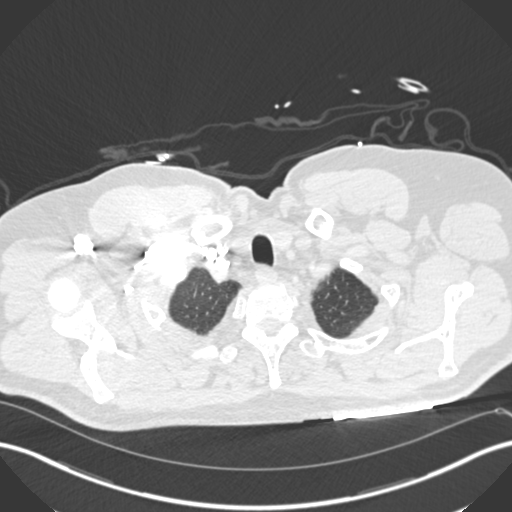
[im 262/274  soft-tissue]
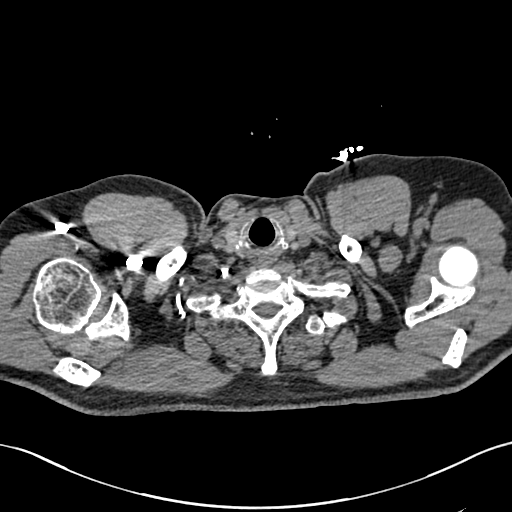

[Series 7: coronal mpr · coronal · 0.54mm/px · 2 of 92 slices shown]
[im 31/92  soft-tissue]
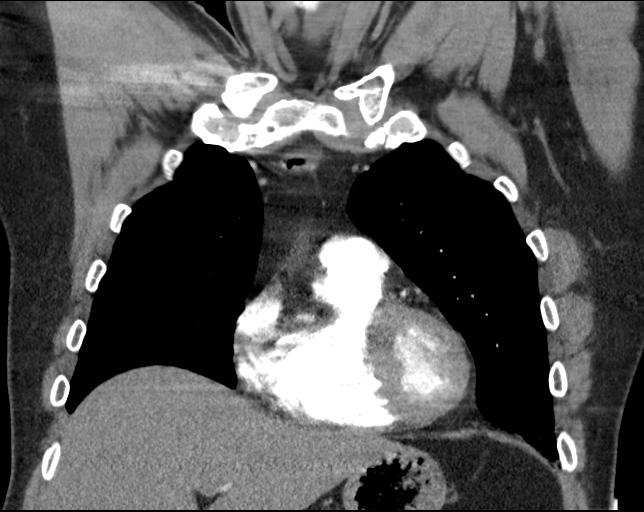
[im 61/92  soft-tissue]
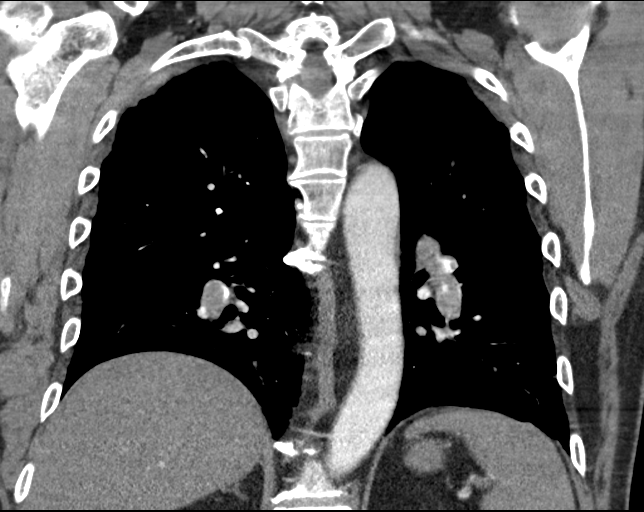

[18 of 46 positions shown; findings below may reference images not displayed]

FINDINGS: Cardiovascular: This is a technically satisfactory study. Acute
pulmonary emboli are noted at the bifurcation of the main pulmonary
artery and extending into both main pulmonary arteries and multiple
bilateral pulmonary segments. RV/LV ratio measures 1.3 compatible
with CT evidence of RIGHT heart strain. A small pericardial effusion
is noted. No thoracic aortic aneurysm. Coronary artery
atherosclerotic calcifications are present.

Mediastinum/Nodes: No enlarged mediastinal, hilar, or axillary lymph
nodes. Thyroid gland, trachea, and esophagus demonstrate no
significant findings.

Lungs/Pleura: Mild dependent and bibasilar atelectasis noted. No
airspace disease, consolidation, mass, pleural effusion or
pneumothorax.

Upper Abdomen: Question mild hepatic steatosis. No acute
abnormality.

Musculoskeletal: No acute or suspicious bony abnormality.

Review of the MIP images confirms the above findings.
IMPRESSION: 1. Acute large clot burden bilateral pulmonary emboli with CT
evidence of RIGHT heart strain.
2. Mild dependent/bibasilar atelectasis.
3. Coronary artery disease.  Small pericardial effusion.
4. Question hepatic steatosis.

Critical Value/emergent results were called by telephone at the time
of interpretation on 01/22/2019 at [DATE] to [place]
PAW , who verbally acknowledged these results.

## 2020-10-30 IMAGING — DX DG CHEST 1V PORT
1 series · 1 of 1 positions shown · non-contrast
Comparison: 08/23/2005

CLINICAL DATA: Dyspnea, diaphoresis, syncopal episode

EXAM:
PORTABLE CHEST 1 VIEW

[chest ap]
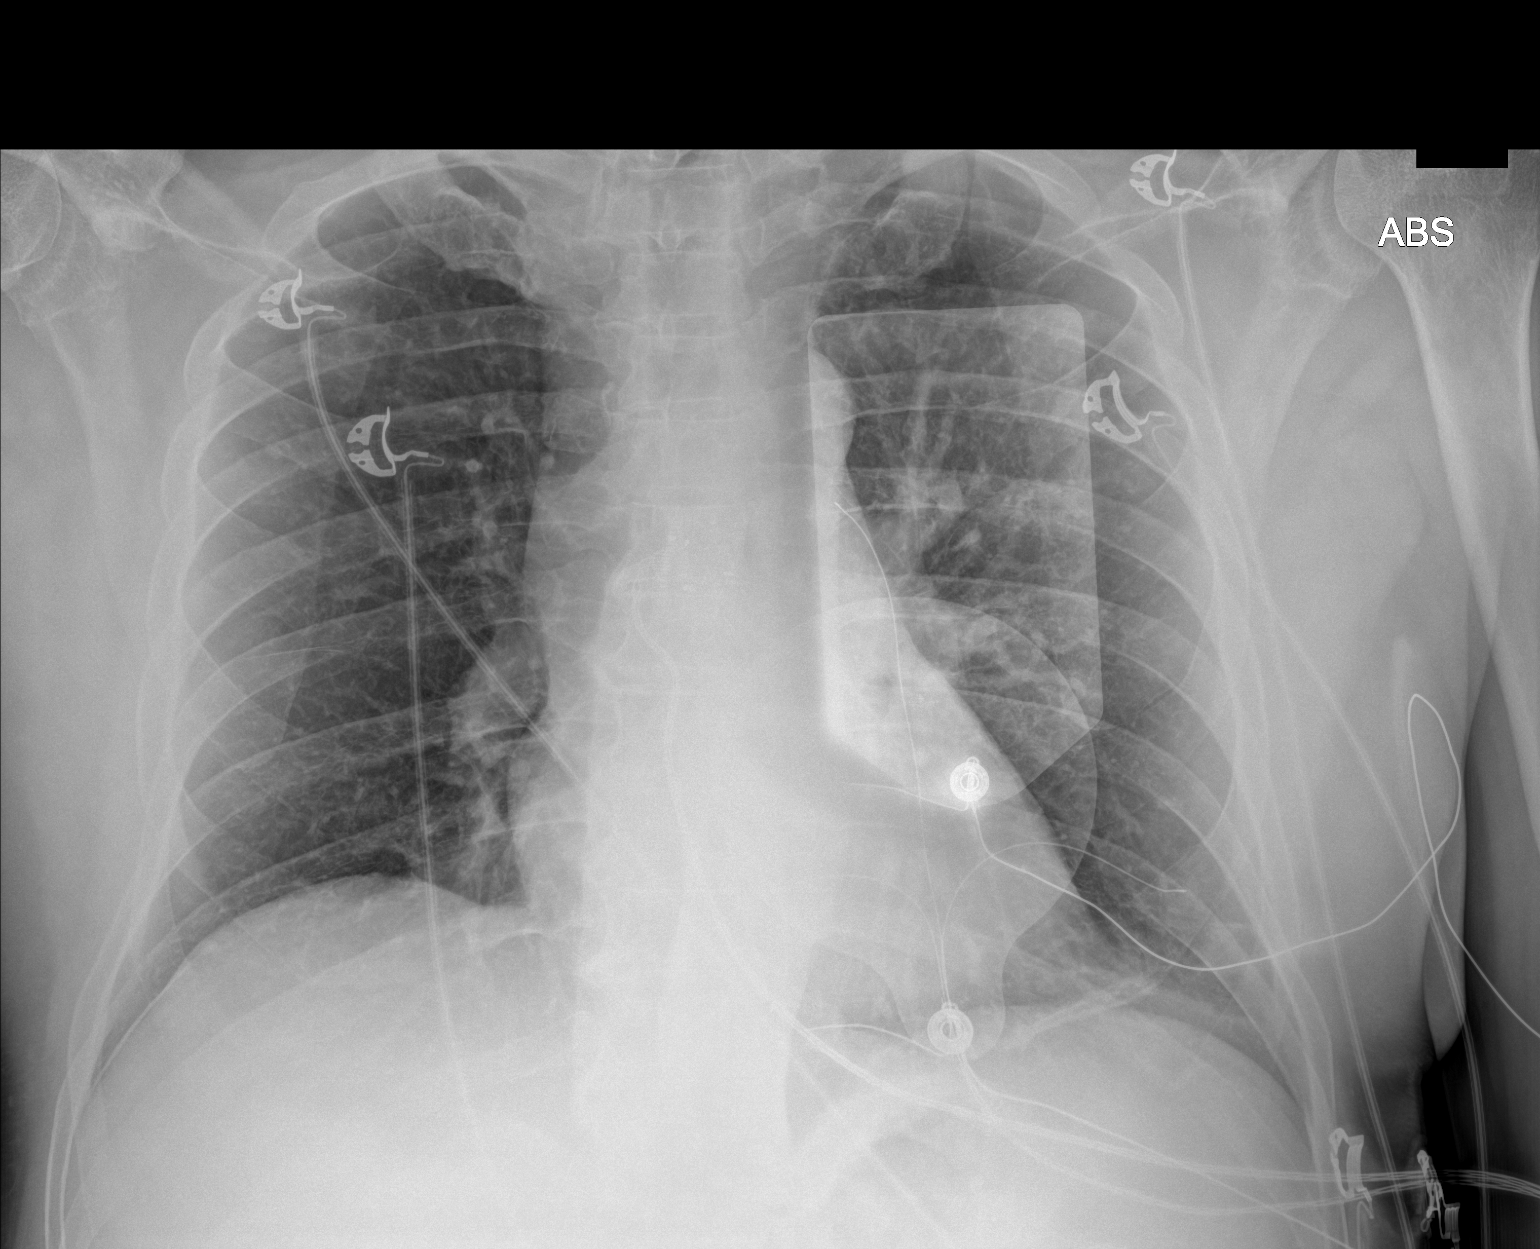

[1 of 1 positions shown; findings below may reference images not displayed]

FINDINGS: The heart size and mediastinal contours are within normal limits.
Both lungs are clear. The visualized skeletal structures are
unremarkable.
IMPRESSION: No active disease.

## 2020-11-01 ENCOUNTER — Ambulatory Visit
Admission: RE | Admit: 2020-11-01 | Discharge: 2020-11-01 | Disposition: A | Discharge status: Home / Self Care | Payer: Commercial Managed Care - PPO | Source: Ambulatory Visit | Attending: Oncology | Admitting: Oncology

## 2020-11-01 ENCOUNTER — Other Ambulatory Visit: Payer: Self-pay

## 2020-11-01 ENCOUNTER — Inpatient Hospital Stay: Payer: Commercial Managed Care - PPO | Attending: Oncology | Admitting: Oncology

## 2020-11-01 DIAGNOSIS — N281 Cyst of kidney, acquired: Secondary | ICD-10-CM | POA: Diagnosis present

## 2020-11-01 DIAGNOSIS — I2602 Saddle embolus of pulmonary artery with acute cor pulmonale: Secondary | ICD-10-CM

## 2020-11-01 LAB — POCT I-STAT CREATININE: Creatinine, Ser: 0.8 mg/dL (ref 0.61–1.24)

## 2020-11-01 MED ORDER — IOHEXOL 350 MG/ML SOLN
100.0000 mL | Freq: Once | INTRAVENOUS | Status: AC | PRN
  Administered 2020-11-01: 80 mL via INTRAVENOUS

## 2020-11-01 NOTE — Progress Notes (Signed)
Patient wants to know if his cysts have gotten any bigger and what is the plan for getting rid of them

## 2020-11-01 NOTE — Progress Notes (Signed)
[place]  Telephone:([phone]) [phone] [phone]) [phone]  ID: Nathan Scott OB: [birth year]  MR#: [id number]  WUX#:[id number]  Patient Care Team: Rusty Aus, MD as PCP - General (Internal Medicine)  I connected with Nathan Scott on 11/04/20 at  3:00 PM EDT by video enabled telemedicine visit and verified that I am speaking with the correct person using two identifiers.   I discussed the limitations, risks, security and privacy concerns of performing an evaluation and management service by telemedicine and the availability of in-person appointments. I also discussed with the patient that there may be a patient responsible charge related to this service. The patient expressed understanding and agreed to proceed.   Other persons participating in the visit and their role in the encounter: Patient, patient's wife, MD.  Patient's location: Home. Provider's location: Clinic.   CHIEF COMPLAINT: Pulmonary embolism, DVT. Renal cysts.  INTERVAL HISTORY: Patient agreed to video assisted telemedicine visit for further evaluation and discussion of his imaging results.  He currently feels well and is asymptomatic.  He has no neurologic complaints.  He denies any recent fevers or illnesses.  He has a good appetite and denies weight loss.  He has no chest pain, shortness of breath, cough, or hemoptysis.  He denies any nausea, vomiting, constipation, or diarrhea.  He has no urinary complaints.  Patient offers no specific complaints today.  REVIEW OF SYSTEMS:   Review of Systems  Constitutional: Negative.  Negative for fever, malaise/fatigue and weight loss.  Respiratory: Negative.  Negative for cough, hemoptysis and shortness of breath.   Cardiovascular: Negative.  Negative for chest pain and leg swelling.  Gastrointestinal: Negative.  Negative for abdominal pain, blood in stool and melena.  Genitourinary: Negative.  Negative for dysuria.  Musculoskeletal: Negative.  Negative for  back pain.  Skin: Negative.  Negative for rash.  Neurological: Negative.  Negative for dizziness, focal weakness, weakness and headaches.  Psychiatric/Behavioral: Negative.  The patient is not nervous/anxious.    As per HPI. Otherwise, a complete review of systems is negative.  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   GERD (gastroesophageal reflux disease)    Hypertension    Pulmonary embolus (HCC)    Sleep apnea    no cpap x 8 years    PAST SURGICAL HISTORY: Past Surgical History:  Procedure Laterality Date   COLONOSCOPY WITH PROPOFOL N/A 02/24/2019   Procedure: COLONOSCOPY WITH PROPOFOL;  Surgeon: Lucilla Lame, MD;  Location: [facility] END[facility];  Service: Endoscopy;  Laterality: N/A;   FRACTURE SURGERY Left    pinning leg fx   LEFT HEART CATH AND CORONARY ANGIOGRAPHY Left 01/12/2019   Procedure: LEFT HEART CATH AND CORONARY ANGIOGRAPHY;  Surgeon: Teodoro Spray, MD;  Location: [place] [facility];  Service: Cardiovascular;  Laterality: Left;   NASAL SEPTOPLASTY W/ TURBINOPLASTY Bilateral 02/20/2016   Procedure: NASAL SEPTOPLASTY WITH TURBINATE REDUCTION;  Surgeon: Margaretha Sheffield, MD;  Location: [facility];  Service: ENT;  Laterality: Bilateral;   PULMONARY THROMBECTOMY Bilateral 01/22/2019   Procedure: PULMONARY THROMBECTOMY;  Surgeon: Katha Cabal, MD;  Location: [place] [facility];  Service: Cardiovascular;  Laterality: Bilateral;    FAMILY HISTORY: Family History  Problem Relation Age of Onset   Heart attack Father    Clotting disorder Paternal Grandmother     ADVANCED DIRECTIVES (Y/N):  N  HEALTH MAINTENANCE: Social History   Tobacco Use   Smoking status: Never   Smokeless tobacco:  Never  Vaping Use   Vaping Use: Never used  Substance Use Topics   Alcohol use: No   Drug use: No     Colonoscopy:  PAP:  Bone density:  Lipid panel:  No Known Allergies  Current Outpatient Medications  Medication Sig  Dispense Refill   ALPRAZolam (XANAX) 0.5 MG tablet Take 0.5 mg by mouth at bedtime as needed for sleep.      APPLE CIDER VINEGAR PO Take 3 capsules by mouth daily.      aspirin EC 81 MG tablet Take 81 mg by mouth daily.     cholecalciferol (VITAMIN D) 25 MCG (1000 UT) tablet Take 1,000 Units by mouth daily.     Chromium Picolinate 800 MCG TABS Take by mouth.     cyclobenzaprine (FLEXERIL) 10 MG tablet Take 10 mg by mouth daily as needed for muscle spasms.      Flaxseed, Linseed, (FLAXSEED OIL) 1200 MG CAPS Take 2,400 mg by mouth daily.     glucosamine-chondroitin 500-400 MG tablet Take 1 tablet by mouth daily.      hydrochlorothiazide (HYDRODIURIL) 25 MG tablet Take 25 mg by mouth daily.      Magnesium Oxide 500 MG TABS Take 500 mg by mouth daily.     mupirocin ointment (BACTROBAN) 2 % Apply 1 application topically daily. Qd to excision site 22 g 1   Omega-3 Fatty Acids (FISH OIL) 1000 MG CAPS Take 2,000 mg by mouth daily.     pyridOXINE (VITAMIN B-6) 100 MG tablet Take 100 mg by mouth daily.     rivaroxaban (XARELTO) 10 MG TABS tablet Take 1 tablet (10 mg total) by mouth daily. 30 tablet 11   sertraline (ZOLOFT) 50 MG tablet Take 50 mg by mouth at bedtime.     simvastatin (ZOCOR) 20 MG tablet Take 20 mg by mouth at bedtime.     triamcinolone (NASACORT) 55 MCG/ACT AERO nasal inhaler Place 2 sprays into the nose daily.     Turmeric (QC TUMERIC COMPLEX PO) Take 1,000 mg by mouth.     venlafaxine XR (EFFEXOR-XR) 37.5 MG 24 hr capsule Take 1 capsule by mouth daily.     ALPRAZolam (XANAX) 0.5 MG tablet Take by mouth. (Patient not taking: Reported on 11/01/2020)     apixaban (ELIQUIS) 5 MG TABS tablet Eliquis 5 mg tablet  TAKE 1 TABLET BY MOUTH TWICE A DAY (Patient not taking: Reported on 11/01/2020)     omeprazole (PRILOSEC) 40 MG capsule Take 40 mg by mouth daily before breakfast. (Patient not taking: Reported on 11/01/2020)     temazepam (RESTORIL) 15 MG capsule Take 15 mg by mouth at bedtime as  needed. (Patient not taking: Reported on 11/01/2020)     triamcinolone cream (KENALOG) 0.1 % Apply 1 application topically 2 (two) times daily as needed (for itching legs.). (Patient not taking: Reported on 11/01/2020)     No current facility-administered medications for this visit.    OBJECTIVE: There were no vitals filed for this visit.   There is no height or weight on file to calculate BMI.    ECOG FS:0 - Asymptomatic  General: Well-developed, well-nourished, no acute distress. HEENT: Normocephalic. Neuro: Alert, answering all questions appropriately. Cranial nerves grossly intact. Psych: Normal affect.   LAB RESULTS:  Lab Results  Component Value Date   NA 136 01/23/2019   K 3.9 01/23/2019   CL 106 01/23/2019   CO2 25 01/23/2019   GLUCOSE 114 (H) 01/23/2019   BUN 25 (H) 01/23/2019  CREATININE 0.80 11/01/2020   CALCIUM 8.0 (L) 01/23/2019   GFRNONAA >60 01/23/2019   GFRAA >60 01/23/2019    Lab Results  Component Value Date   WBC 6.7 01/23/2019   NEUTROABS 5.1 01/22/2019   HGB 11.6 (L) 01/23/2019   HCT 34.0 (L) 01/23/2019   MCV 87.2 01/23/2019   PLT 126 (L) 01/23/2019     STUDIES: CT Abdomen Pelvis W Contrast  Result Date: 11/01/2020 CLINICAL DATA:  Follow-up renal cysts. EXAM: CT ABDOMEN AND PELVIS WITH CONTRAST TECHNIQUE: Multidetector CT imaging of the abdomen and pelvis was performed using the standard protocol following bolus administration of intravenous contrast. CONTRAST:  16mL OMNIPAQUE IOHEXOL 350 MG/ML SOLN COMPARISON:  11/02/2019 CT abdomen/pelvis.  02/04/2019 MRI abdomen. FINDINGS: Lower chest: No significant pulmonary nodules or acute consolidative airspace disease. Hepatobiliary: Normal liver size. No liver mass. Normal gallbladder with no radiopaque cholelithiasis. No biliary ductal dilatation. Pancreas: Normal, with no mass or duct dilation. Spleen: Normal size. No mass. Adrenals/Urinary Tract: Normal adrenals. Simple parapelvic 5.9 x 4.2 cm right renal  cyst (series in/image 22), previously 6.0 x 4.2 cm using similar measurement technique, stable. Simple 2.7 cm upper right renal cortical cyst and simple 3.3 cm medial lower left renal cortical cyst are stable. Additional scattered subcentimeter hypodense bilateral renal cortical lesions are too small to characterize and appear unchanged, considered benign. Hypodense 1.3 cm anterior upper left renal cortical lesion (series 7/image 19), stable and characterized as Bosniak category 2 on 02/04/2019 MRI. No suspicious renal masses. No hydronephrosis. Normal bladder. Stomach/Bowel: Small hiatal hernia. Otherwise normal nondistended stomach. Normal caliber small bowel with no small bowel wall thickening. Normal appendix. Oral contrast transits to the colon. Moderate sigmoid diverticulosis with no large bowel wall thickening or significant pericolonic fat stranding. Vascular/Lymphatic: Atherosclerotic nonaneurysmal abdominal aorta. Patent portal, splenic, hepatic and renal veins. Circumaortic left renal vein. No pathologically enlarged lymph nodes in the abdomen or pelvis. Reproductive: Normal size prostate. Other: No pneumoperitoneum, ascites or focal fluid collection. Musculoskeletal: No aggressive appearing focal osseous lesions. Mild lumbar spondylosis. IMPRESSION: 1. Stable benign bilateral renal cysts. No suspicious renal masses. 2. Moderate sigmoid diverticulosis. 3. Small hiatal hernia. 4. Aortic Atherosclerosis (ICD10-I70.0). Electronically Signed   By: Ilona Sorrel M.D.   On: 11/01/2020 10:15     ASSESSMENT: Pulmonary embolism, DVT  PLAN:    1. Pulmonary embolism, DVT: Patient appeared to have no transient risk factors.  Imaging results previously reviewed independently with no obvious underlying malignancy.  Patient underwent thrombectomy with thrombolysis with significant improvement of his symptoms.  Full hypercoagulable work-up was negative.  Given the size of the PE and that fact that he was noted to  have right heart strain, he ws hesitant to discontinue anticoagulation and now is taking prophylactic doses at 2.5 mg twice daily. 2.  Renal cyst: Benign.  Biopsy and aspiration at [facility] on August 05, 2019 was inconclusive.  CT scan results from November 01, 2020 reviewed independently and report as above with no change in cystic lesions.  These are likely benign.  No further interventions are needed.  No further follow-up has been scheduled.    I provided 20 minutes of face-to-face video visit time during this encounter which included chart review, counseling, and coordination of care as documented above.    Patient expressed understanding and was in agreement with this plan. He also understands that He can call clinic at any time with any questions, concerns, or complaints.    Lloyd Huger, MD  11/01/2020 3:06 PM

## 2020-11-28 ENCOUNTER — Ambulatory Visit (INDEPENDENT_AMBULATORY_CARE_PROVIDER_SITE_OTHER): Payer: Commercial Managed Care - PPO | Admitting: Vascular Surgery

## 2020-12-05 ENCOUNTER — Ambulatory Visit (INDEPENDENT_AMBULATORY_CARE_PROVIDER_SITE_OTHER): Payer: Commercial Managed Care - PPO | Admitting: Vascular Surgery

## 2020-12-06 ENCOUNTER — Ambulatory Visit (INDEPENDENT_AMBULATORY_CARE_PROVIDER_SITE_OTHER): Payer: Commercial Managed Care - PPO | Admitting: Nurse Practitioner

## 2020-12-08 ENCOUNTER — Other Ambulatory Visit: Payer: Self-pay

## 2020-12-08 ENCOUNTER — Ambulatory Visit (INDEPENDENT_AMBULATORY_CARE_PROVIDER_SITE_OTHER): Payer: Commercial Managed Care - PPO | Admitting: Vascular Surgery

## 2020-12-08 VITALS — BP 121/80 | HR 79 | Ht 73.0 in | Wt 221.0 lb

## 2020-12-08 DIAGNOSIS — E782 Mixed hyperlipidemia: Secondary | ICD-10-CM

## 2020-12-08 DIAGNOSIS — K219 Gastro-esophageal reflux disease without esophagitis: Secondary | ICD-10-CM | POA: Diagnosis not present

## 2020-12-08 DIAGNOSIS — I2609 Other pulmonary embolism with acute cor pulmonale: Secondary | ICD-10-CM

## 2020-12-08 DIAGNOSIS — I2782 Chronic pulmonary embolism: Secondary | ICD-10-CM

## 2020-12-08 DIAGNOSIS — M79661 Pain in right lower leg: Secondary | ICD-10-CM | POA: Diagnosis not present

## 2020-12-08 DIAGNOSIS — M7989 Other specified soft tissue disorders: Secondary | ICD-10-CM

## 2020-12-08 NOTE — Progress Notes (Signed)
MRN : [id number]  Nathan Scott is a 60 y.o. (1960-06-08) male who presents with chief complaint of follow up for PE.  History of Present Illness:   The patient presents to the office for routine follow-up evaluation status post pulmonary thrombectomy.    Thrombectomy was performed on 01/22/2019:  Mechanical thrombectomy with the penumbra CAT 12 right upper, middle and lower lobe pulmonary arteries and left lower lobe pulmonary artery.    The presenting symptoms were pleuritic chest pain and profound SOB.    Patient is continue to stay active he is remodeling a house at the present time.  He is also continued to play trumpet on a daily basis.  For the most part he states his breathing is reasonably good.  He denies pleuritic chest pain.  He denies persisting cough or hemoptysis.   The patient ruptured his Achilles and is having leg pain and swelling dependency.     No blood per rectum or blood in any sputum.  No excessive bruising or bleeding episodes  No outpatient medications have been marked as taking for the 12/08/20 encounter (Appointment) with Delana Meyer, Dolores Lory, MD.    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   GERD (gastroesophageal reflux disease)    Hypertension    Pulmonary embolus ([place])    Sleep apnea    no cpap x 8 years    Past Surgical History:  Procedure Laterality Date   COLONOSCOPY WITH PROPOFOL N/A 02/24/2019   Procedure: COLONOSCOPY WITH PROPOFOL;  Surgeon: Lucilla Lame, MD;  Location: [facility];  Service: Endoscopy;  Laterality: N/A;   FRACTURE SURGERY Left    pinning leg fx   LEFT HEART CATH AND CORONARY ANGIOGRAPHY Left 01/12/2019   Procedure: LEFT HEART CATH AND CORONARY ANGIOGRAPHY;  Surgeon: Teodoro Spray, MD;  Location: [place] [facility];  Service: Cardiovascular;  Laterality: Left;   NASAL SEPTOPLASTY W/ TURBINOPLASTY Bilateral 02/20/2016   Procedure: NASAL SEPTOPLASTY WITH TURBINATE REDUCTION;  Surgeon: Margaretha Sheffield, MD;  Location: [facility] ORS;  Service: ENT;  Laterality: Bilateral;   PULMONARY THROMBECTOMY Bilateral 01/22/2019   Procedure: PULMONARY THROMBECTOMY;  Surgeon: Katha Cabal, MD;  Location: [place] [facility];  Service: Cardiovascular;  Laterality: Bilateral;    Social History Social History   Tobacco Use   Smoking status: Never   Smokeless tobacco: Never  Vaping Use   Vaping Use: Never used  Substance Use Topics   Alcohol use: No   Drug use: No    Family History Family History  Problem Relation Age of Onset   Heart attack Father    Clotting disorder Paternal Grandmother     No Known Allergies   REVIEW OF SYSTEMS (Negative unless checked)  Constitutional: '[]'$ Weight loss  '[]'$ Fever  '[]'$ Chills Cardiac: '[]'$ Chest pain   '[]'$ Chest pressure   '[]'$ Palpitations   '[]'$ Shortness of breath when laying flat   '[]'$ Shortness of breath with exertion. Vascular:  '[]'$ Pain in legs with walking   '[]'$ Pain in legs at rest  '[x]'$ History of PE   '[]'$ Phlebitis   '[x]'$ Swelling in legs   '[]'$ Varicose veins   '[]'$ Non-healing ulcers Pulmonary:   '[]'$ Uses home oxygen   '[]'$ Productive cough   '[]'$ Hemoptysis   '[]'$ Wheeze  '[]'$ COPD   '[]'$ Asthma Neurologic:  '[]'$ Dizziness   '[]'$ Seizures   '[]'$ History of stroke   '[]'$ History of TIA  '[]'$ Aphasia   '[]'$ Vissual changes   '[]'$ Weakness or numbness in arm   '[]'$ Weakness or numbness in leg Musculoskeletal:   '[]'$   Joint swelling   '[x]'$ Joint pain   '[]'$ Low back pain Hematologic:  '[]'$ Easy bruising  '[]'$ Easy bleeding   '[]'$ Hypercoagulable state   '[]'$ Anemic Gastrointestinal:  '[]'$ Diarrhea   '[]'$ Vomiting  '[x]'$ Gastroesophageal reflux/heartburn   '[]'$ Difficulty swallowing. Genitourinary:  '[]'$ Chronic kidney disease   '[]'$ Difficult urination  '[]'$ Frequent urination   '[]'$ Blood in urine Skin:  '[]'$ Rashes   '[]'$ Ulcers  Psychological:  '[]'$ History of anxiety   '[]'$  History of major depression.  Physical Examination  There were no vitals filed for this visit. There is no height or weight on file to calculate BMI. Gen: WD/WN, NAD Head: [place]/AT, No  temporalis wasting.  Ear/Nose/Throat: Hearing grossly intact, nares w/o erythema or drainage, pinna without lesions Eyes: PER, EOMI, sclera nonicteric.  Neck: Supple, no gross masses.  No JVD.  Pulmonary:  Good air movement, no audible wheezing, no use of accessory muscles.  Cardiac: RRR, precordium not hyperdynamic. Vascular:  Right leg in a boot  2+ edema  Vessel Right Left  Radial Palpable Palpable  Gastrointestinal: soft, non-distended. No guarding/no peritoneal signs.  Musculoskeletal: M/S 5/5 throughout.  No deformity.  Neurologic: CN 2-12 intact. Pain and light touch intact in extremities.  Symmetrical.  Speech is fluent. Motor exam as listed above. Psychiatric: Judgment intact, Mood & affect appropriate for pt's clinical situation. Dermatologic: Venous rashes no ulcers noted.  No changes consistent with cellulitis. Lymph : No lichenification or skin changes of chronic lymphedema.  CBC Lab Results  Component Value Date   WBC 6.7 01/23/2019   HGB 11.6 (L) 01/23/2019   HCT 34.0 (L) 01/23/2019   MCV 87.2 01/23/2019   PLT 126 (L) 01/23/2019    BMET    Component Value Date/Time   NA 136 01/23/2019 0322   NA 140 03/25/2013 1449   K 3.9 01/23/2019 0322   K 3.8 03/25/2013 1449   CL 106 01/23/2019 0322   CL 107 03/25/2013 1449   CO2 25 01/23/2019 0322   CO2 28 03/25/2013 1449   GLUCOSE 114 (H) 01/23/2019 0322   GLUCOSE 84 03/25/2013 1449   BUN 25 (H) 01/23/2019 0322   BUN 18 03/25/2013 1449   CREATININE 0.80 11/01/2020 0916   CREATININE 0.77 03/25/2013 1449   CALCIUM 8.0 (L) 01/23/2019 0322   CALCIUM 9.0 03/25/2013 1449   GFRNONAA >60 01/23/2019 0322   GFRNONAA >60 03/25/2013 1449   GFRAA >60 01/23/2019 0322   GFRAA >60 03/25/2013 1449   CrCl cannot be calculated (Patient's most recent lab result is older than the maximum 21 days allowed.).  COAG Lab Results  Component Value Date   INR 1.1 01/22/2019    Radiology No results found.   Assessment/Plan 1.  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 Hyperlipemia, mixed Continue statin as ordered and reviewed, no changes at this time   3. Gastroesophageal reflux disease without esophagitis Continue PPI as already ordered, this medication has been reviewed and there are no changes at this time.  Avoidence of caffeine and alcohol  Moderate elevation of the head of the bed    4. Pain and swelling of right lower leg I will get an ultrasound at his convenience - VAS [place] LOWER EXTREMITY VENOUS (DVT); Future    Hortencia Pilar, MD  12/08/2020 8:52 AM

## 2020-12-11 ENCOUNTER — Encounter (INDEPENDENT_AMBULATORY_CARE_PROVIDER_SITE_OTHER): Payer: Self-pay | Admitting: Vascular Surgery

## 2020-12-12 ENCOUNTER — Ambulatory Visit (INDEPENDENT_AMBULATORY_CARE_PROVIDER_SITE_OTHER): Payer: Commercial Managed Care - PPO | Admitting: Vascular Surgery

## 2020-12-12 ENCOUNTER — Ambulatory Visit (INDEPENDENT_AMBULATORY_CARE_PROVIDER_SITE_OTHER): Payer: Commercial Managed Care - PPO

## 2020-12-12 ENCOUNTER — Other Ambulatory Visit: Payer: Self-pay

## 2020-12-12 VITALS — BP 130/82 | HR 74 | Ht 73.0 in | Wt 227.0 lb

## 2020-12-12 DIAGNOSIS — Z86711 Personal history of pulmonary embolism: Secondary | ICD-10-CM | POA: Diagnosis not present

## 2020-12-12 DIAGNOSIS — M79661 Pain in right lower leg: Secondary | ICD-10-CM

## 2020-12-12 DIAGNOSIS — M7989 Other specified soft tissue disorders: Secondary | ICD-10-CM

## 2020-12-12 DIAGNOSIS — D6859 Other primary thrombophilia: Secondary | ICD-10-CM

## 2020-12-12 DIAGNOSIS — M79604 Pain in right leg: Secondary | ICD-10-CM

## 2020-12-12 DIAGNOSIS — R600 Localized edema: Secondary | ICD-10-CM | POA: Diagnosis not present

## 2020-12-12 DIAGNOSIS — K219 Gastro-esophageal reflux disease without esophagitis: Secondary | ICD-10-CM

## 2020-12-12 DIAGNOSIS — E782 Mixed hyperlipidemia: Secondary | ICD-10-CM

## 2020-12-19 ENCOUNTER — Encounter (INDEPENDENT_AMBULATORY_CARE_PROVIDER_SITE_OTHER): Payer: Self-pay | Admitting: Vascular Surgery

## 2020-12-19 DIAGNOSIS — M79606 Pain in leg, unspecified: Secondary | ICD-10-CM | POA: Insufficient documentation

## 2020-12-19 NOTE — Progress Notes (Signed)
MRN : [id number]  Tyre Coady is a 60 y.o. ([birth year]) male who presents with chief complaint of follow up right leg.  History of Present Illness:   At his recent visit it was noted he ruptured his Achilles and is having leg pain and swelling dependency.    He has started walking without his boot and his swelling is a little better  Duplex ultrasound shows widely patent deep venous system no recurrent thrombus   Current Meds  Medication Sig   ALPRAZolam (XANAX) 0.5 MG tablet Take 0.5 mg by mouth at bedtime as needed for sleep.    ALPRAZolam (XANAX) 0.5 MG tablet Take by mouth.   apixaban (ELIQUIS) 5 MG TABS tablet    APPLE CIDER VINEGAR PO Take 3 capsules by mouth daily.    aspirin EC 81 MG tablet Take 81 mg by mouth daily.   cholecalciferol (VITAMIN D) 25 MCG (1000 UT) tablet Take 1,000 Units by mouth daily.   Chromium Picolinate 800 MCG TABS Take by mouth.   cyclobenzaprine (FLEXERIL) 10 MG tablet Take 10 mg by mouth daily as needed for muscle spasms.    Flaxseed, Linseed, (FLAXSEED OIL) 1200 MG CAPS Take 2,400 mg by mouth daily.   glucosamine-chondroitin 500-400 MG tablet Take 1 tablet by mouth daily.    hydrochlorothiazide (HYDRODIURIL) 25 MG tablet Take 25 mg by mouth daily.    Magnesium Oxide 500 MG TABS Take 500 mg by mouth daily.   mupirocin ointment (BACTROBAN) 2 % Apply 1 application topically daily. Qd to excision site   Omega-3 Fatty Acids (FISH OIL) 1000 MG CAPS Take 2,000 mg by mouth daily.   omeprazole (PRILOSEC) 40 MG capsule Take 40 mg by mouth daily before breakfast.   pyridOXINE (VITAMIN B-6) 100 MG tablet Take 100 mg by mouth daily.   rivaroxaban (XARELTO) 10 MG TABS tablet Take 1 tablet (10 mg total) by mouth daily.   sertraline (ZOLOFT) 50 MG tablet Take 50 mg by mouth at bedtime.   simvastatin (ZOCOR) 20 MG tablet Take 20 mg by mouth at bedtime.   temazepam (RESTORIL) 15 MG capsule Take 15 mg by mouth at bedtime as needed.   triamcinolone (NASACORT) 55  MCG/ACT AERO nasal inhaler Place 2 sprays into the nose daily.   triamcinolone cream (KENALOG) 0.1 % Apply 1 application topically 2 (two) times daily as needed (for itching legs.).   Turmeric (QC TUMERIC COMPLEX PO) Take 1,000 mg by mouth.   venlafaxine XR (EFFEXOR-XR) 37.5 MG 24 hr capsule Take 1 capsule by mouth daily.    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   GERD (gastroesophageal reflux disease)    Hypertension    Pulmonary embolus (HCC)    Sleep apnea    no cpap x 8 years    Past Surgical History:  Procedure Laterality Date   COLONOSCOPY WITH PROPOFOL N/A 02/24/2019   Procedure: COLONOSCOPY WITH PROPOFOL;  Surgeon: Lucilla Lame, MD;  Location: [facility];  Service: Endoscopy;  Laterality: N/A;   FRACTURE SURGERY Left    pinning leg fx   LEFT HEART CATH AND CORONARY ANGIOGRAPHY Left 01/12/2019   Procedure: LEFT HEART CATH AND CORONARY ANGIOGRAPHY;  Surgeon: Teodoro Spray, MD;  Location: [facility];  Service: Cardiovascular;  Laterality: Left;   NASAL SEPTOPLASTY W/ TURBINOPLASTY Bilateral 02/20/2016   Procedure: NASAL SEPTOPLASTY WITH TURBINATE REDUCTION;  Surgeon: Margaretha Sheffield, MD;  Location: [facility];  Service: ENT;  Laterality:  Bilateral;   PULMONARY THROMBECTOMY Bilateral 01/22/2019   Procedure: PULMONARY THROMBECTOMY;  Surgeon: Katha Cabal, MD;  Location: [facility];  Service: Cardiovascular;  Laterality: Bilateral;    Social History Social History   Tobacco Use   Smoking status: Never   Smokeless tobacco: Never  Vaping Use   Vaping Use: Never used  Substance Use Topics   Alcohol use: No   Drug use: No    Family History Family History  Problem Relation Age of Onset   Heart attack Father    Clotting disorder Paternal Grandmother     No Known Allergies   REVIEW OF SYSTEMS (Negative unless checked)  Constitutional: '[]'$ Weight loss  '[]'$ Fever  '[]'$ Chills Cardiac: '[]'$ Chest pain   '[]'$ Chest pressure    '[]'$ Palpitations   '[]'$ Shortness of breath when laying flat   '[]'$ Shortness of breath with exertion. Vascular:  '[]'$ Pain in legs with walking   '[]'$ Pain in legs at rest  '[x]'$ History of DVT   '[]'$ Phlebitis   '[x]'$ Swelling in legs   '[]'$ Varicose veins   '[]'$ Non-healing ulcers Pulmonary:   '[]'$ Uses home oxygen   '[]'$ Productive cough   '[]'$ Hemoptysis   '[]'$ Wheeze  '[]'$ COPD   '[]'$ Asthma Neurologic:  '[]'$ Dizziness   '[]'$ Seizures   '[]'$ History of stroke   '[]'$ History of TIA  '[]'$ Aphasia   '[]'$ Vissual changes   '[]'$ Weakness or numbness in arm   '[]'$ Weakness or numbness in leg Musculoskeletal:   '[]'$ Joint swelling   '[x]'$ Joint pain   '[]'$ Low back pain Hematologic:  '[]'$ Easy bruising  '[]'$ Easy bleeding   '[x]'$ Hypercoagulable state   '[]'$ Anemic Gastrointestinal:  '[]'$ Diarrhea   '[]'$ Vomiting  '[x]'$ Gastroesophageal reflux/heartburn   '[]'$ Difficulty swallowing. Genitourinary:  '[]'$ Chronic kidney disease   '[]'$ Difficult urination  '[]'$ Frequent urination   '[]'$ Blood in urine Skin:  '[]'$ Rashes   '[]'$ Ulcers  Psychological:  '[]'$ History of anxiety   '[]'$  History of major depression.  Physical Examination  Vitals:   12/12/20 1620  BP: 130/82  Pulse: 74  Weight: 227 lb (103 kg)  Height: '6\' 1"'$  (1.854 m)   Body mass index is 29.95 kg/m. Gen: WD/WN, NAD Head: [place]/AT, No temporalis wasting.  Ear/Nose/Throat: Hearing grossly intact, nares w/o erythema or drainage, pinna without lesions Eyes: PER, EOMI, sclera nonicteric.  Neck: Supple, no gross masses.  No JVD.  Pulmonary:  Good air movement, no audible wheezing, no use of accessory muscles.  Cardiac: RRR, precordium not hyperdynamic. Vascular:  scattered varicosities present bilaterally.  Mild venous stasis changes to the legs bilaterally.  2+ soft pitting edema  Vessel Right Left  Radial Palpable Palpable  Gastrointestinal: soft, non-distended. No guarding/no peritoneal signs.  Musculoskeletal: M/S 5/5 throughout.  No deformity.  Neurologic: CN 2-12 intact. Pain and light touch intact in extremities.  Symmetrical.  Speech is fluent. Motor  exam as listed above. Psychiatric: Judgment intact, Mood & affect appropriate for pt's clinical situation. Dermatologic: Venous rashes no ulcers noted.  No changes consistent with cellulitis. Lymph : No lichenification or skin changes of chronic lymphedema.  CBC Lab Results  Component Value Date   WBC 6.7 01/23/2019   HGB 11.6 (L) 01/23/2019   HCT 34.0 (L) 01/23/2019   MCV 87.2 01/23/2019   PLT 126 (L) 01/23/2019    BMET    Component Value Date/Time   NA 136 01/23/2019 0322   NA 140 03/25/2013 1449   K 3.9 01/23/2019 0322   K 3.8 03/25/2013 1449   CL 106 01/23/2019 0322   CL 107 03/25/2013 1449   CO2 25 01/23/2019 0322   CO2 28 03/25/2013  1449   GLUCOSE 114 (H) 01/23/2019 0322   GLUCOSE 84 03/25/2013 1449   BUN 25 (H) 01/23/2019 0322   BUN 18 03/25/2013 1449   CREATININE 0.80 11/01/2020 0916   CREATININE 0.77 03/25/2013 1449   CALCIUM 8.0 (L) 01/23/2019 0322   CALCIUM 9.0 03/25/2013 1449   GFRNONAA >60 01/23/2019 0322   GFRNONAA >60 03/25/2013 1449   GFRAA >60 01/23/2019 0322   GFRAA >60 03/25/2013 1449   CrCl cannot be calculated (Patient's most recent lab result is older than the maximum 21 days allowed.).  COAG Lab Results  Component Value Date   INR 1.1 01/22/2019    Radiology VAS [place] LOWER EXTREMITY VENOUS (DVT)  Result Date: 12/12/2020  Lower Venous DVT Study Patient Name:  DINESH GASSETT  Date of Exam:   12/12/2020 Medical Rec #: [id number]       Accession #:    [id number] Date of Birth: 1961/01/12        Patient Gender: M Patient Age:   46 years Exam Location:  [facility] Procedure:      VAS [place] LOWER EXTREMITY VENOUS (DVT) Referring Phys: Hortencia Pilar --------------------------------------------------------------------------------  Indications: High Risk.  Risk Factors: Surgery 01/22/2019: Pulmonary Thrombectomy. Performing Technologist: Almira Coaster RVS  Examination Guidelines: A complete evaluation includes B-mode imaging, spectral Doppler,  color Doppler, and power Doppler as needed of all accessible portions of each vessel. Bilateral testing is considered an integral part of a complete examination. Limited examinations for reoccurring indications may be performed as noted. The reflux portion of the exam is performed with the patient in reverse Trendelenburg.  +---------+---------------+---------+-----------+----------+--------------+ RIGHT    CompressibilityPhasicitySpontaneityPropertiesThrombus Aging +---------+---------------+---------+-----------+----------+--------------+ CFV      Full           Yes      Yes                                 +---------+---------------+---------+-----------+----------+--------------+ SFJ      Full           Yes      Yes                                 +---------+---------------+---------+-----------+----------+--------------+ FV Prox  Full           Yes      Yes                                 +---------+---------------+---------+-----------+----------+--------------+ FV Mid   Full           Yes      Yes                                 +---------+---------------+---------+-----------+----------+--------------+ FV DistalFull           Yes      Yes                                 +---------+---------------+---------+-----------+----------+--------------+ PFV      Full           Yes      Yes                                 +---------+---------------+---------+-----------+----------+--------------+  POP      Full           Yes      Yes                                 +---------+---------------+---------+-----------+----------+--------------+ PTV      Full           Yes      Yes                                 +---------+---------------+---------+-----------+----------+--------------+ PERO     Full           Yes      Yes                                 +---------+---------------+---------+-----------+----------+--------------+ GSV      Full            Yes      Yes                                 +---------+---------------+---------+-----------+----------+--------------+ SSV      Full           Yes      Yes                                 +---------+---------------+---------+-----------+----------+--------------+     Summary: RIGHT: - No evidence of deep vein thrombosis in the lower extremity. No indirect evidence of obstruction proximal to the inguinal ligament.  LEFT: - No evidence of common femoral vein obstruction.  *See table(s) above for measurements and observations. Electronically signed by Hortencia Pilar MD on 12/12/2020 at 5:21:51 PM.    Final      Assessment/Plan 1. Pain of right lower extremity Recommend:   No surgery or intervention at this point in time.  IVC filter is not indicated at present.  Patient's duplex ultrasound of the venous system shows no evidence of DVT in the right lower extremity.  The patient continued on anticoagulation   Elevation was stressed, use of a recliner was discussed.  I have had a long discussion with the patient regarding DVT and post phlebitic changes such as swelling and why it  causes symptoms such as pain.  The patient will wear graduated compression stockings class 1 (20-30 mmHg). The patient will  wear the stockings first thing in the morning and removing them in the evening. The patient is instructed specifically not to sleep in the stockings.  In addition, behavioral modification including elevation during the day and avoidance of prolonged dependency will be initiated.    The patient will continue anticoagulation for now as there have not been any problems or complications at this point.    2. History of pulmonary embolus (PE) Recommend:   No surgery or intervention at this point in time.  IVC filter is not indicated at present.  Patient's duplex ultrasound of the venous system shows no evidence of DVT in the right lower extremity.  The patient continued on  anticoagulation   Elevation was stressed, use of a recliner was discussed.  I have had a long discussion with the patient regarding DVT and post  phlebitic changes such as swelling and why it  causes symptoms such as pain.  The patient will wear graduated compression stockings class 1 (20-30 mmHg). The patient will  wear the stockings first thing in the morning and removing them in the evening. The patient is instructed specifically not to sleep in the stockings.  In addition, behavioral modification including elevation during the day and avoidance of prolonged dependency will be initiated.    The patient will continue anticoagulation for now as there have not been any problems or complications at this point.    3. Gastroesophageal reflux disease without esophagitis Continue PPI as already ordered, this medication has been reviewed and there are no changes at this time.  Avoidence of caffeine and alcohol  Moderate elevation of the head of the bed    4. Primary hypercoagulable state ([place]) Continue anticoagulation  5. Hyperlipemia, mixed Continue statin as ordered and reviewed, no changes at this time      Hortencia Pilar, MD  12/19/2020 2:37 PM

## 2021-06-22 ENCOUNTER — Ambulatory Visit (INDEPENDENT_AMBULATORY_CARE_PROVIDER_SITE_OTHER): Payer: Commercial Managed Care - PPO | Admitting: Dermatology

## 2021-06-22 ENCOUNTER — Other Ambulatory Visit: Payer: Self-pay

## 2021-06-22 DIAGNOSIS — L2089 Other atopic dermatitis: Secondary | ICD-10-CM | POA: Diagnosis not present

## 2021-06-22 DIAGNOSIS — L309 Dermatitis, unspecified: Secondary | ICD-10-CM

## 2021-06-22 DIAGNOSIS — Z1283 Encounter for screening for malignant neoplasm of skin: Secondary | ICD-10-CM

## 2021-06-22 DIAGNOSIS — L820 Inflamed seborrheic keratosis: Secondary | ICD-10-CM

## 2021-06-22 DIAGNOSIS — L578 Other skin changes due to chronic exposure to nonionizing radiation: Secondary | ICD-10-CM | POA: Diagnosis not present

## 2021-06-22 DIAGNOSIS — D229 Melanocytic nevi, unspecified: Secondary | ICD-10-CM | POA: Diagnosis not present

## 2021-06-22 NOTE — Progress Notes (Signed)
Follow-Up Visit   Subjective  Nathan Scott is a 61 y.o. male who presents for the following: Follow-up (Patient here today for 1 year tbse. Patient reports something on right hip and back of left shoulder, and patient reports some spots at scalp. ). The patient presents for Total-Body Skin Exam (TBSE) for skin cancer screening and mole check.  The patient has spots, moles and lesions to be evaluated, some may be new or changing and the patient has concerns that these could be cancer.  The following portions of the chart were reviewed this encounter and updated as appropriate:  Tobacco  Allergies  Meds  Problems  Med Hx  Surg Hx  Fam Hx      Objective  Well appearing patient in no apparent distress; mood and affect are within normal limits.  A full examination was performed including scalp, head, eyes, ears, nose, lips, neck, chest, axillae, abdomen, back, buttocks, bilateral upper extremities, bilateral lower extremities, hands, feet, fingers, toes, fingernails, and toenails. All findings within normal limits unless otherwise noted below.  bilateral shoulders and back x 15, right thigh x 1, left scalp crown area x 1 (17) Erythematous stuck-on, waxy papule or plaque   Assessment & Plan  Inflamed seborrheic keratosis (17) bilateral shoulders and back x 15, right thigh x 1, left scalp crown area x 1 Irritated and bothers patient   left scalp crown area  Recheck 3 - 4 months  May spot at right upper eyelid   Destruction of lesion - bilateral shoulders and back x 15, right thigh x 1, left scalp crown area x 1 Complexity: simple   Destruction method: cryotherapy   Informed consent: discussed and consent obtained   Timeout:  patient name, date of birth, surgical site, and procedure verified Lesion destroyed using liquid nitrogen: Yes   Region frozen until ice ball extended beyond lesion: Yes   Outcome: patient tolerated procedure well with no complications   Post-procedure  details: wound care instructions given   Additional details:  Prior to procedure, discussed risks of blister formation, small wound, skin dyspigmentation, or rare scar following cryotherapy. Recommend Vaseline ointment to treated areas while healing.  Atopic dermatitis Left Ankle - Posterior Atopic dermatitis (eczema) is a chronic, relapsing, pruritic condition that can significantly affect quality of life. It is often associated with allergic rhinitis and/or asthma and can require treatment with topical medications, phototherapy, or in severe cases biologic injectable medication (Dupixent; Adbry) or Oral JAK inhibitors.  Triamcinolone 0.1% cream when flared at itchy areas at body   Skin cancer screening  Lentigines - Scattered tan macules - Due to sun exposure - Benign-appearing, observe - Recommend daily broad spectrum sunscreen SPF 30+ to sun-exposed areas, reapply every 2 hours as needed. - Call for any changes  Acrochordons (Skin Tags) - Fleshy, skin-colored pedunculated papules under arms  - Benign appearing.  - Observe. - If desired, they can be removed with an in office procedure that is not covered by insurance. - Please call the clinic if you notice any new or changing lesions.  Seborrheic Keratoses - Stuck-on, waxy, tan-brown papules and/or plaques  - Benign-appearing - Discussed benign etiology and prognosis. - Observe - Call for any changes  Melanocytic Nevi - Tan-brown and/or pink-flesh-colored symmetric macules and papules - Benign appearing on exam today - Observation - Call clinic for new or changing moles - Recommend daily use of broad spectrum spf 30+ sunscreen to sun-exposed areas.   Hemangiomas - Red papules - Discussed  benign nature - Observe - Call for any changes  Actinic Damage - Chronic condition, secondary to cumulative UV/sun exposure - diffuse scaly erythematous macules with underlying dyspigmentation - Recommend daily broad spectrum  sunscreen SPF 30+ to sun-exposed areas, reapply every 2 hours as needed.  - Staying in the shade or wearing long sleeves, sun glasses (UVA+UVB protection) and wide brim hats (4-inch brim around the entire circumference of the hat) are also recommended for sun protection.  - Call for new or changing lesions.  Skin cancer screening performed today.  Return for  4 month isk follow up, 1 year tbse . IAsher Muir, CMA, am acting as scribe for Armida Sans, MD. Documentation: I have reviewed the above documentation for accuracy and completeness, and I agree with the above.  Armida Sans, MD

## 2021-06-22 NOTE — Patient Instructions (Addendum)
Seborrheic Keratosis  What causes seborrheic keratoses? Seborrheic keratoses are harmless, common skin growths that first appear during adult life.  As time goes by, more growths appear.  Some people may develop a large number of them.  Seborrheic keratoses appear on both covered and uncovered body parts.  They are not caused by sunlight.  The tendency to develop seborrheic keratoses can be inherited.  They vary in color from skin-colored to gray, brown, or even black.  They can be either smooth or have a rough, warty surface.   Seborrheic keratoses are superficial and look as if they were stuck on the skin.  Under the microscope this type of keratosis looks like layers upon layers of skin.  That is why at times the top layer may seem to fall off, but the rest of the growth remains and re-grows.    Treatment Seborrheic keratoses do not need to be treated, but can easily be removed in the office.  Seborrheic keratoses often cause symptoms when they rub on clothing or jewelry.  Lesions can be in the way of shaving.  If they become inflamed, they can cause itching, soreness, or burning.  Removal of a seborrheic keratosis can be accomplished by freezing, burning, or surgery. If any spot bleeds, scabs, or grows rapidly, please return to have it checked, as these can be an indication of a skin cancer.  Cryotherapy Aftercare  Wash gently with soap and water everyday.   Apply Vaseline and Band-Aid daily until healed.      Melanoma ABCDEs  Melanoma is the most dangerous type of skin cancer, and is the leading cause of death from skin disease.  You are more likely to develop melanoma if you: Have light-colored skin, light-colored eyes, or red or blond hair Spend a lot of time in the sun Tan regularly, either outdoors or in a tanning bed Have had blistering sunburns, especially during childhood Have a close family member who has had a melanoma Have atypical moles or large birthmarks  Early  detection of melanoma is key since treatment is typically straightforward and cure rates are extremely high if we catch it early.   The first sign of melanoma is often a change in a mole or a new dark spot.  The ABCDE system is a way of remembering the signs of melanoma.  A for asymmetry:  The two halves do not match. B for border:  The edges of the growth are irregular. C for color:  A mixture of colors are present instead of an even brown color. D for diameter:  Melanomas are usually (but not always) greater than 30m - the size of a pencil eraser. E for evolution:  The spot keeps changing in size, shape, and color.  Please check your skin once per month between visits. You can use a small mirror in front and a large mirror behind you to keep an eye on the back side or your body.   If you see any new or changing lesions before your next follow-up, please call to schedule a visit.  Please continue daily skin protection including broad spectrum sunscreen SPF 30+ to sun-exposed areas, reapplying every 2 hours as needed when you're outdoors.   Staying in the shade or wearing long sleeves, sun glasses (UVA+UVB protection) and wide brim hats (4-inch brim around the entire circumference of the hat) are also recommended for sun protection.    If You Need Anything After Your Visit  If you have any questions  or concerns for your doctor, please call our main line at [phone] and press option 4 to reach your doctor's medical assistant. If no one answers, please leave a voicemail as directed and we will return your call as soon as possible. Messages left after 4 pm will be answered the following business day.   You may also send [place] a message via [place]. We typically respond to MyChart messages within 1-2 business days.  For prescription refills, please ask your pharmacy to contact our office. Our fax number is [phone].  If you have an urgent issue when the clinic is closed that cannot wait  until the next business day, you can page your doctor at the number below.    Please note that while we do our best to be available for urgent issues outside of office hours, we are not available 24/7.   If you have an urgent issue and are unable to reach [place], you may choose to seek medical care at your doctor's office, retail clinic, urgent care center, or emergency room.  If you have a medical emergency, please immediately call 911 or go to the emergency department.  Pager Numbers  - Dr. Nehemiah Massed: [phone]  - Dr. Laurence Ferrari: [phone]  - Dr. Nicole Kindred: [phone]  In the event of inclement weather, please call our main line at [phone] for an update on the status of any delays or closures.  Dermatology Medication Tips: Please keep the boxes that topical medications come in in order to help keep track of the instructions about where and how to use these. Pharmacies typically print the medication instructions only on the boxes and not directly on the medication tubes.   If your medication is too expensive, please contact our office at [phone] option 4 or send [place] a message through [place].   We are unable to tell what your co-pay for medications will be in advance as this is different depending on your insurance coverage. However, we may be able to find a substitute medication at lower cost or fill out paperwork to get insurance to cover a needed medication.   If a prior authorization is required to get your medication covered by your insurance company, please allow [place] 1-2 business days to complete this process.  Drug prices often vary depending on where the prescription is filled and some pharmacies may offer cheaper prices.  The website www.goodrx.com contains coupons for medications through different pharmacies. The prices here do not account for what the cost may be with help from insurance (it may be cheaper with your insurance), but the website can give you the price if you  did not use any insurance.  - You can print the associated coupon and take it with your prescription to the pharmacy.  - You may also stop by our office during regular business hours and pick up a GoodRx coupon card.  - If you need your prescription sent electronically to a different pharmacy, notify our office through [facility] or by phone at [phone] option 4.     Si Usted Necesita Algo Despus de Su Visita  Tambin puede enviarnos un mensaje a travs de Pharmacist, community. Por lo general respondemos a los mensajes de MyChart en el transcurso de 1 a 2 das hbiles.  Para renovar recetas, por favor pida a su farmacia que se ponga en contacto con nuestra oficina. Harland Dingwall de fax es [place] [phone].  Si tiene un asunto urgente cuando la clnica est cerrada y que no puede  esperar [place] hbil, puede llamar/localizar a su doctor(a) al nmero que aparece a continuacin.   Por favor, tenga en cuenta que aunque hacemos todo lo posible para estar disponibles para asuntos urgentes fuera del horario de [place], no estamos disponibles las 24 horas del da, los 7 das de la [place].   Si tiene un problema urgente y no puede comunicarse con nosotros, puede optar por buscar atencin mdica  en el consultorio de su doctor(a), en una clnica privada, en un centro de atencin urgente o en una sala de emergencias.  Si tiene Engineering geologist, por favor llame inmediatamente al 911 o vaya a la sala de emergencias.  Nmeros de bper  - Dr. Nehemiah Massed: [phone]  - Dra. Moye: [phone]  - Dra. Nicole Kindred: [phone]  En caso de inclemencias del [place], por favor llame a Johnsie Kindred principal al [phone] para una actualizacin sobre el [place] de cualquier retraso o cierre.  Consejos para la medicacin en dermatologa: Por favor, guarde las cajas en las que vienen los medicamentos de uso tpico para ayudarle a seguir las instrucciones sobre dnde y cmo usarlos. Las  farmacias generalmente imprimen las instrucciones del medicamento slo en las cajas y no directamente en los tubos del [place].   Si su medicamento es muy caro, por favor, pngase en contacto con Zigmund Daniel llamando al [phone] y presione la opcin 4 o envenos un mensaje a travs de Pharmacist, community.   No podemos decirle cul ser su copago por los medicamentos por adelantado ya que esto es diferente dependiendo de la cobertura de su seguro. Sin embargo, es posible que podamos encontrar un medicamento sustituto a Electrical engineer un formulario para que el seguro cubra el medicamento que se considera necesario.   Si se requiere una autorizacin previa para que su compaa de seguros [place] su medicamento, por favor permtanos de 1 a 2 das hbiles para completar este proceso.  Los precios de los medicamentos varan con frecuencia dependiendo del Environmental consultant de dnde se surte la receta y alguna farmacias pueden ofrecer precios ms baratos.  El sitio web www.goodrx.com tiene cupones para medicamentos de Airline pilot. Los precios aqu no tienen en cuenta lo que podra costar con la ayuda del seguro (puede ser ms barato con su seguro), pero el sitio web puede darle el precio si no utiliz Research scientist (physical sciences).  - Puede imprimir el cupn correspondiente y llevarlo con su receta a la farmacia.  - Tambin puede pasar por nuestra oficina durante el horario de atencin regular y Charity fundraiser una tarjeta de cupones de GoodRx.  - Si necesita que su receta se enve electrnicamente a una farmacia diferente, informe a nuestra oficina a travs de MyChart de [place] o por telfono llamando al [phone] y presione la opcin 4.

## 2021-06-28 ENCOUNTER — Encounter: Payer: Self-pay | Admitting: Dermatology

## 2021-09-09 ENCOUNTER — Other Ambulatory Visit (INDEPENDENT_AMBULATORY_CARE_PROVIDER_SITE_OTHER): Payer: Self-pay | Admitting: Nurse Practitioner

## 2021-10-23 ENCOUNTER — Ambulatory Visit (INDEPENDENT_AMBULATORY_CARE_PROVIDER_SITE_OTHER): Payer: Commercial Managed Care - PPO | Admitting: Dermatology

## 2021-10-23 DIAGNOSIS — L820 Inflamed seborrheic keratosis: Secondary | ICD-10-CM | POA: Diagnosis not present

## 2021-10-23 DIAGNOSIS — L821 Other seborrheic keratosis: Secondary | ICD-10-CM | POA: Diagnosis not present

## 2021-10-23 NOTE — Patient Instructions (Signed)
Cryotherapy Aftercare  Wash gently with soap and water everyday.   Apply Vaseline and Band-Aid daily until healed.     Due to recent changes in healthcare laws, you may see results of your pathology and/or laboratory studies on MyChart before the doctors have had a chance to review them. We understand that in some cases there may be results that are confusing or concerning to you. Please understand that not all results are received at the same time and often the doctors may need to interpret multiple results in order to provide you with the best plan of care or course of treatment. Therefore, we ask that you please give us 2 business days to thoroughly review all your results before contacting the office for clarification. Should we see a critical lab result, you will be contacted sooner.   If You Need Anything After Your Visit  If you have any questions or concerns for your doctor, please call our main line at [phone] and press option 4 to reach your doctor's medical assistant. If no one answers, please leave a voicemail as directed and we will return your call as soon as possible. Messages left after 4 pm will be answered the following business day.   You may also send us a message via MyChart. We typically respond to MyChart messages within 1-2 business days.  For prescription refills, please ask your pharmacy to contact our office. Our fax number is [phone].  If you have an urgent issue when the clinic is closed that cannot wait until the next business day, you can page your doctor at the number below.    Please note that while we do our best to be available for urgent issues outside of office hours, we are not available 24/7.   If you have an urgent issue and are unable to reach us, you may choose to seek medical care at your doctor's office, retail clinic, urgent care center, or emergency room.  If you have a medical emergency, please immediately call 911 or go to the  emergency department.  Pager Numbers  - Dr. Kowalski: [phone]  - Dr. Moye: [phone]  - Dr. Stewart: [phone]  In the event of inclement weather, please call our main line at [phone] for an update on the status of any delays or closures.  Dermatology Medication Tips: Please keep the boxes that topical medications come in in order to help keep track of the instructions about where and how to use these. Pharmacies typically print the medication instructions only on the boxes and not directly on the medication tubes.   If your medication is too expensive, please contact our office at [phone] option 4 or send us a message through MyChart.   We are unable to tell what your co-pay for medications will be in advance as this is different depending on your insurance coverage. However, we may be able to find a substitute medication at lower cost or fill out paperwork to get insurance to cover a needed medication.   If a prior authorization is required to get your medication covered by your insurance company, please allow us 1-2 business days to complete this process.  Drug prices often vary depending on where the prescription is filled and some pharmacies may offer cheaper prices.  The website www.goodrx.com contains coupons for medications through different pharmacies. The prices here do not account for what the cost may be with help from insurance (it may be cheaper with your insurance), but the website can   give you the price if you did not use any insurance.  - You can print the associated coupon and take it with your prescription to the pharmacy.  - You may also stop by our office during regular business hours and pick up a GoodRx coupon card.  - If you need your prescription sent electronically to a different pharmacy, notify our office through [place] MyChart or by phone at [phone] option 4.     Si Usted Necesita Algo Despus de Su Visita  Tambin puede  enviarnos un mensaje a travs de MyChart. Por lo general respondemos a los mensajes de MyChart en el transcurso de 1 a 2 das hbiles.  Para renovar recetas, por favor pida a su farmacia que se ponga en contacto con nuestra oficina. Nuestro nmero de fax es el [phone].  Si tiene un asunto urgente cuando la clnica est cerrada y que no puede esperar hasta el siguiente da hbil, puede llamar/localizar a su doctor(a) al nmero que aparece a continuacin.   Por favor, tenga en cuenta que aunque hacemos todo lo posible para estar disponibles para asuntos urgentes fuera del horario de oficina, no estamos disponibles las 24 horas del da, los 7 das de la semana.   Si tiene un problema urgente y no puede comunicarse con nosotros, puede optar por buscar atencin mdica  en el consultorio de su doctor(a), en una clnica privada, en un centro de atencin urgente o en una sala de emergencias.  Si tiene una emergencia mdica, por favor llame inmediatamente al 911 o vaya a la sala de emergencias.  Nmeros de bper  - Dr. Kowalski: [phone]  - Dra. Moye: [phone]  - Dra. Stewart: [phone]  En caso de inclemencias del tiempo, por favor llame a nuestra lnea principal al [phone] para una actualizacin sobre el estado de cualquier retraso o cierre.  Consejos para la medicacin en dermatologa: Por favor, guarde las cajas en las que vienen los medicamentos de uso tpico para ayudarle a seguir las instrucciones sobre dnde y cmo usarlos. Las farmacias generalmente imprimen las instrucciones del medicamento slo en las cajas y no directamente en los tubos del medicamento.   Si su medicamento es muy caro, por favor, pngase en contacto con nuestra oficina llamando al [phone] y presione la opcin 4 o envenos un mensaje a travs de MyChart.   No podemos decirle cul ser su copago por los medicamentos por adelantado ya que esto es diferente dependiendo de la cobertura de su seguro.  Sin embargo, es posible que podamos encontrar un medicamento sustituto a menor costo o llenar un formulario para que el seguro cubra el medicamento que se considera necesario.   Si se requiere una autorizacin previa para que su compaa de seguros cubra su medicamento, por favor permtanos de 1 a 2 das hbiles para completar este proceso.  Los precios de los medicamentos varan con frecuencia dependiendo del lugar de dnde se surte la receta y alguna farmacias pueden ofrecer precios ms baratos.  El sitio web www.goodrx.com tiene cupones para medicamentos de diferentes farmacias. Los precios aqu no tienen en cuenta lo que podra costar con la ayuda del seguro (puede ser ms barato con su seguro), pero el sitio web puede darle el precio si no utiliz ningn seguro.  - Puede imprimir el cupn correspondiente y llevarlo con su receta a la farmacia.  - Tambin puede pasar por nuestra oficina durante el horario de atencin regular y recoger una tarjeta de cupones de GoodRx.  -   Si necesita que su receta se enve electrnicamente a una farmacia diferente, informe a nuestra oficina a travs de MyChart de [place] o por telfono llamando al [phone] y presione la opcin 4.  

## 2021-10-23 NOTE — Progress Notes (Signed)
   Follow-Up Visit   Subjective  Nathan Scott is a 62 y.o. male who presents for the following: Other (Spots of chest, right hand, ant, neck and right upper eyelid that are irritating). The patient has spots, moles and lesions to be evaluated, some may be new or changing and the patient has concerns that these could be cancer.   The following portions of the chart were reviewed this encounter and updated as appropriate:   Tobacco  Allergies  Meds  Problems  Med Hx  Surg Hx  Fam Hx     Review of Systems:  No other skin or systemic complaints except as noted in HPI or Assessment and Plan.  Objective  Well appearing patient in no apparent distress; mood and affect are within normal limits.  All skin waist up examined.  Ant neck x 1, left chest x 1, right hand x 1, (3) Erythematous stuck-on, waxy papule or plaque  Right upper lat eyelid Erythematous stuck-on, waxy papule or plaque   Assessment & Plan   Seborrheic Keratoses - Stuck-on, waxy, tan-brown papules and/or plaques  - Benign-appearing - Discussed benign etiology and prognosis. - Observe - Call for any changes  Inflamed seborrheic keratosis (3) Ant neck x 1, left chest x 1, right hand x 1,  Destruction of lesion - Ant neck x 1, left chest x 1, right hand x 1, Complexity: simple   Destruction method: cryotherapy   Informed consent: discussed and consent obtained   Timeout:  patient name, date of birth, surgical site, and procedure verified Lesion destroyed using liquid nitrogen: Yes   Region frozen until ice ball extended beyond lesion: Yes   Outcome: patient tolerated procedure well with no complications   Post-procedure details: wound care instructions given    Seborrheic keratosis, inflamed Right upper lat eyelid margin  Destruction of lesion - Right upper lat eyelid Complexity: simple   Destruction method: cryotherapy   Informed consent: discussed and consent obtained   Timeout:  patient name, date  of birth, surgical site, and procedure verified Lesion destroyed using liquid nitrogen: Yes   Region frozen until ice ball extended beyond lesion: Yes   Outcome: patient tolerated procedure well with no complications   Post-procedure details: wound care instructions given    Return for Follow up as scheduled.  I, Ashok Cordia, CMA, am acting as scribe for Sarina Ser, MD . Documentation: I have reviewed the above documentation for accuracy and completeness, and I agree with the above.  Sarina Ser, MD

## 2021-10-31 ENCOUNTER — Encounter: Payer: Self-pay | Admitting: Dermatology

## 2021-12-11 ENCOUNTER — Ambulatory Visit (INDEPENDENT_AMBULATORY_CARE_PROVIDER_SITE_OTHER): Payer: Commercial Managed Care - PPO | Admitting: Vascular Surgery

## 2022-01-08 ENCOUNTER — Ambulatory Visit (INDEPENDENT_AMBULATORY_CARE_PROVIDER_SITE_OTHER): Payer: Commercial Managed Care - PPO | Admitting: Vascular Surgery

## 2022-01-08 ENCOUNTER — Encounter (INDEPENDENT_AMBULATORY_CARE_PROVIDER_SITE_OTHER): Payer: Self-pay | Admitting: Vascular Surgery

## 2022-01-08 VITALS — BP 130/86 | HR 88 | Resp 16 | Wt 229.0 lb

## 2022-01-08 DIAGNOSIS — E782 Mixed hyperlipidemia: Secondary | ICD-10-CM

## 2022-01-08 DIAGNOSIS — K219 Gastro-esophageal reflux disease without esophagitis: Secondary | ICD-10-CM | POA: Diagnosis not present

## 2022-01-08 DIAGNOSIS — Z86711 Personal history of pulmonary embolism: Secondary | ICD-10-CM | POA: Diagnosis not present

## 2022-01-08 DIAGNOSIS — D6859 Other primary thrombophilia: Secondary | ICD-10-CM

## 2022-01-12 ENCOUNTER — Encounter (INDEPENDENT_AMBULATORY_CARE_PROVIDER_SITE_OTHER): Payer: Self-pay | Admitting: Vascular Surgery

## 2022-01-12 MED ORDER — RIVAROXABAN 10 MG PO TABS: 10.0000 mg | ORAL_TABLET | Freq: Every day | ORAL | 3 refills | Status: AC

## 2022-01-12 NOTE — Progress Notes (Signed)
MRN : [id number]  Nathan Scott is a 61 y.o. ([birth year]) male who presents with chief complaint of legs hurt and swell.  History of Present Illness:  The patient presents to the office for routine follow-up evaluation status post pulmonary thrombectomy.    Thrombectomy was performed on 01/22/2019:  Mechanical thrombectomy with the penumbra CAT 12 right upper, middle and lower lobe pulmonary arteries and left lower lobe pulmonary artery.    The presenting symptoms were pleuritic chest pain and profound SOB.    Patient is continue to stay active he is remodeling a house at the present time.  He is also continued to play trumpet on a daily basis.  For the most part he states his breathing is reasonably good.  He denies pleuritic chest pain.  He denies persisting cough or hemoptysis.   The patient has healed his ruptured his Achilles.  His housing remodeling project which he is doing a large portion of the work continues but he has been doing well with this no excessive bruising or bleeding.   No blood per rectum or blood in any sputum.      Current Meds  Medication Sig   ALPRAZolam (XANAX) 0.5 MG tablet Take 0.5 mg by mouth at bedtime as needed for sleep.    apixaban (ELIQUIS) 5 MG TABS tablet    APPLE CIDER VINEGAR PO Take 3 capsules by mouth daily.    aspirin EC 81 MG tablet Take 81 mg by mouth daily.   cholecalciferol (VITAMIN D) 25 MCG (1000 UT) tablet Take 1,000 Units by mouth daily.   Chromium Picolinate 800 MCG TABS Take by mouth.   cyclobenzaprine (FLEXERIL) 10 MG tablet Take 10 mg by mouth daily as needed for muscle spasms.    Flaxseed, Linseed, (FLAXSEED OIL) 1200 MG CAPS Take 2,400 mg by mouth daily.   glucosamine-chondroitin 500-400 MG tablet Take 1 tablet by mouth daily.    hydrochlorothiazide (HYDRODIURIL) 25 MG tablet Take 25 mg by mouth daily.    Magnesium Oxide 500 MG TABS Take 500 mg by mouth daily.   mupirocin ointment (BACTROBAN) 2 % Apply 1 application  topically daily. Qd to excision site   Omega-3 Fatty Acids (FISH OIL) 1000 MG CAPS Take 2,000 mg by mouth daily.   omeprazole (PRILOSEC) 40 MG capsule Take 40 mg by mouth daily before breakfast.   pyridOXINE (VITAMIN B-6) 100 MG tablet Take 100 mg by mouth daily.   sertraline (ZOLOFT) 50 MG tablet Take 50 mg by mouth at bedtime.   simvastatin (ZOCOR) 20 MG tablet Take 20 mg by mouth at bedtime.   temazepam (RESTORIL) 15 MG capsule Take 15 mg by mouth at bedtime as needed.   triamcinolone (NASACORT) 55 MCG/ACT AERO nasal inhaler Place 2 sprays into the nose daily.   triamcinolone cream (KENALOG) 0.1 % Apply 1 application topically 2 (two) times daily as needed (for itching legs.).   Turmeric (QC TUMERIC COMPLEX PO) Take 1,000 mg by mouth.   XARELTO 10 MG TABS tablet TAKE 1 TABLET BY MOUTH EVERY DAY    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   GERD (gastroesophageal reflux disease)    Hypertension    Pulmonary embolus (HCC)    Sleep apnea    no cpap x 8 years    Past Surgical History:  Procedure Laterality Date   COLONOSCOPY WITH PROPOFOL N/A 02/24/2019   Procedure: COLONOSCOPY  WITH PROPOFOL;  Surgeon: Lucilla Lame, MD;  Location: [facility] ENDOSCOPY;  Service: Endoscopy;  Laterality: N/A;   FRACTURE SURGERY Left    pinning leg fx   LEFT HEART CATH AND CORONARY ANGIOGRAPHY Left 01/12/2019   Procedure: LEFT HEART CATH AND CORONARY ANGIOGRAPHY;  Surgeon: Teodoro Spray, MD;  Location: [place] CV LAB;  Service: Cardiovascular;  Laterality: Left;   NASAL SEPTOPLASTY W/ TURBINOPLASTY Bilateral 02/20/2016   Procedure: NASAL SEPTOPLASTY WITH TURBINATE REDUCTION;  Surgeon: Margaretha Sheffield, MD;  Location: [facility] ORS;  Service: ENT;  Laterality: Bilateral;   PULMONARY THROMBECTOMY Bilateral 01/22/2019   Procedure: PULMONARY THROMBECTOMY;  Surgeon: Katha Cabal, MD;  Location: [place] [facility] LAB;  Service: Cardiovascular;  Laterality: Bilateral;    Social  History Social History   Tobacco Use   Smoking status: Never   Smokeless tobacco: Never  Vaping Use   Vaping Use: Never used  Substance Use Topics   Alcohol use: No   Drug use: No    Family History Family History  Problem Relation Age of Onset   Heart attack Father    Clotting disorder Paternal Grandmother     No Known Allergies   REVIEW OF SYSTEMS (Negative unless checked)  Constitutional: '[]'$ Weight loss  '[]'$ Fever  '[]'$ Chills Cardiac: '[]'$ Chest pain   '[]'$ Chest pressure   '[]'$ Palpitations   '[]'$ Shortness of breath when laying flat   '[]'$ Shortness of breath with exertion. Vascular:  '[]'$ Pain in legs with walking   '[x]'$ Pain in legs at rest  '[]'$ History of DVT   '[]'$ Phlebitis   '[x]'$ Swelling in legs   '[]'$ Varicose veins   '[]'$ Non-healing ulcers Pulmonary:   '[]'$ Uses home oxygen   '[]'$ Productive cough   '[]'$ Hemoptysis   '[]'$ Wheeze  '[]'$ COPD   '[]'$ Asthma Neurologic:  '[]'$ Dizziness   '[]'$ Seizures   '[]'$ History of stroke   '[]'$ History of TIA  '[]'$ Aphasia   '[]'$ Vissual changes   '[]'$ Weakness or numbness in arm   '[]'$ Weakness or numbness in leg Musculoskeletal:   '[]'$ Joint swelling   '[]'$ Joint pain   '[]'$ Low back pain Hematologic:  '[]'$ Easy bruising  '[]'$ Easy bleeding   '[]'$ Hypercoagulable state   '[]'$ Anemic Gastrointestinal:  '[]'$ Diarrhea   '[]'$ Vomiting  '[x]'$ Gastroesophageal reflux/heartburn   '[]'$ Difficulty swallowing. Genitourinary:  '[]'$ Chronic kidney disease   '[]'$ Difficult urination  '[]'$ Frequent urination   '[]'$ Blood in urine Skin:  '[]'$ Rashes   '[]'$ Ulcers  Psychological:  '[]'$ History of anxiety   '[]'$  History of major depression.  Physical Examination  Vitals:   01/08/22 1616  BP: 130/86  Pulse: 88  Resp: 16  Weight: 229 lb (103.9 kg)   Body mass index is 30.21 kg/m. Gen: WD/WN, NAD Head: [place]/AT, No temporalis wasting.  Ear/Nose/Throat: Hearing grossly intact, nares w/o erythema or drainage, pinna without lesions Eyes: PER, EOMI, sclera nonicteric.  Neck: Supple, no gross masses.  No JVD.  Pulmonary:  Good air movement, no audible wheezing, no use of  accessory muscles.  Cardiac: RRR, precordium not hyperdynamic. Vascular:  scattered varicosities present bilaterally.  Very mild venous stasis changes to the legs bilaterally.  Trace soft pitting edema  Vessel Right Left  Radial Palpable Palpable  Gastrointestinal: soft, non-distended. No guarding/no peritoneal signs.  Musculoskeletal: M/S 5/5 throughout.  No deformity.  Neurologic: CN 2-12 intact. Pain and light touch intact in extremities.  Symmetrical.  Speech is fluent. Motor exam as listed above. Psychiatric: Judgment intact, Mood & affect appropriate for pt's clinical situation. Dermatologic: Venous rashes no ulcers noted.  No changes consistent with cellulitis. Lymph : No lichenification or skin changes of chronic lymphedema.  CBC Lab Results  Component Value Date   WBC 6.7 01/23/2019   HGB 11.6 (L) 01/23/2019   HCT 34.0 (L) 01/23/2019   MCV 87.2 01/23/2019   PLT 126 (L) 01/23/2019    BMET    Component Value Date/Time   NA 136 01/23/2019 0322   NA 140 03/25/2013 1449   K 3.9 01/23/2019 0322   K 3.8 03/25/2013 1449   CL 106 01/23/2019 0322   CL 107 03/25/2013 1449   CO2 25 01/23/2019 0322   CO2 28 03/25/2013 1449   GLUCOSE 114 (H) 01/23/2019 0322   GLUCOSE 84 03/25/2013 1449   BUN 25 (H) 01/23/2019 0322   BUN 18 03/25/2013 1449   CREATININE 0.80 11/01/2020 0916   CREATININE 0.77 03/25/2013 1449   CALCIUM 8.0 (L) 01/23/2019 0322   CALCIUM 9.0 03/25/2013 1449   GFRNONAA >60 01/23/2019 0322   GFRNONAA >60 03/25/2013 1449   GFRAA >60 01/23/2019 0322   GFRAA >60 03/25/2013 1449   CrCl cannot be calculated (Patient's most recent lab result is older than the maximum 21 days allowed.).  COAG Lab Results  Component Value Date   INR 1.1 01/22/2019    Radiology No results found.   Assessment/Plan 1. History of pulmonary embolus (PE) Recommend:   No surgery or intervention at this point in time.  IVC filter is not indicated at present.  The patient has done  quite well on his lifelong anticoagulation therapy with Xarelto.  I will reorder his Xarelto for a year.  Elevation was stressed, such as the use of a recliner.  I have reviewed DVT and post phlebitic changes such as swelling and why it  causes symptoms such as pain.  The patient should wear graduated compression stockings beginning after three full days of anticoagulation.  The compression should be worn on a daily basis. The patient should wearing the stockings first thing in the morning and removing them in the evening. The patient should not to sleep in the stockings.  In addition, behavioral modification including elevation during the day and avoidance of prolonged dependency will be initiated.    The patient will continue anticoagulation for now as there have not been any problems or complications at this point.    2. Primary hypercoagulable state ([place]) Continue anticoagulation as ordered.  I have reordered his Xarelto  3. Gastroesophageal reflux disease without esophagitis Continue PPI as already ordered, this medication has been reviewed and there are no changes at this time.  Avoidence of caffeine and alcohol  Moderate elevation of the head of the bed    4. Hyperlipemia, mixed Continue statin as ordered and reviewed, no changes at this time    Hortencia Pilar, MD  01/12/2022 8:25 AM

## 2022-02-12 ENCOUNTER — Encounter (INDEPENDENT_AMBULATORY_CARE_PROVIDER_SITE_OTHER): Payer: Self-pay

## 2022-03-01 ENCOUNTER — Other Ambulatory Visit: Payer: Self-pay | Admitting: Internal Medicine

## 2022-03-01 DIAGNOSIS — R972 Elevated prostate specific antigen [PSA]: Secondary | ICD-10-CM

## 2022-03-16 ENCOUNTER — Ambulatory Visit
Admission: RE | Admit: 2022-03-16 | Discharge: 2022-03-16 | Disposition: A | Discharge status: Home / Self Care | Payer: Commercial Managed Care - PPO | Source: Ambulatory Visit | Attending: Internal Medicine | Admitting: Internal Medicine

## 2022-03-16 DIAGNOSIS — R972 Elevated prostate specific antigen [PSA]: Secondary | ICD-10-CM | POA: Diagnosis not present

## 2022-03-16 MED ORDER — GADOBUTROL 1 MMOL/ML IV SOLN
10.0000 mL | Freq: Once | INTRAVENOUS | Status: AC | PRN
  Administered 2022-03-16: 10 mL via INTRAVENOUS

## 2022-05-21 NOTE — Progress Notes (Signed)
RN left voicemail for call back regarding upcoming PMDC.

## 2022-05-22 NOTE — Progress Notes (Signed)
I called pt to introduce myself as the Prostate Nurse Navigator and the Coordinator of the Prostate [place].   1. I confirmed with the patient he is aware of his referral to the clinic 2/9, arriving @ 8:15am.    2. I discussed the format of the clinic and the physicians he will be seeing that day.   3. I discussed where the clinic is located and how to contact me.   4. I confirmed his address and informed him I would be mailing a packet of information and forms to be completed. I asked him to bring them with him the day of his appointment.    He voiced understanding of the above. I asked him to call me if he has any questions or concerns regarding his appointments or the forms he needs to complete.

## 2022-05-25 ENCOUNTER — Ambulatory Visit
Admission: RE | Admit: 2022-05-25 | Discharge: 2022-05-25 | Disposition: A | Discharge status: Home / Self Care | Payer: Commercial Managed Care - PPO | Source: Ambulatory Visit | Attending: Radiation Oncology | Admitting: Radiation Oncology

## 2022-05-25 ENCOUNTER — Inpatient Hospital Stay (HOSPITAL_BASED_OUTPATIENT_CLINIC_OR_DEPARTMENT_OTHER): Payer: Commercial Managed Care - PPO | Admitting: Genetic Counselor

## 2022-05-25 ENCOUNTER — Other Ambulatory Visit: Payer: Self-pay | Admitting: Genetic Counselor

## 2022-05-25 ENCOUNTER — Inpatient Hospital Stay: Payer: Commercial Managed Care - PPO | Attending: Radiation Oncology

## 2022-05-25 VITALS — BP 132/90 | HR 77 | Temp 97.6°F | Resp 18 | Ht 73.0 in | Wt 227.4 lb

## 2022-05-25 DIAGNOSIS — C61 Malignant neoplasm of prostate: Secondary | ICD-10-CM

## 2022-05-25 DIAGNOSIS — Z1379 Encounter for other screening for genetic and chromosomal anomalies: Secondary | ICD-10-CM

## 2022-05-25 LAB — GENETIC SCREENING ORDER

## 2022-05-25 NOTE — Progress Notes (Signed)
Radiation Oncology         [phone] ________________________________  Multidisciplinary Prostate Cancer Clinic  Initial Radiation Oncology Consultation  Name: Nathan Scott MRN: [id number]  Date: 05/25/2022  DOB: Feb 16, 1961  [id number], Christean Grief, MD  Raynelle Bring, MD   REFERRING PHYSICIAN: Raynelle Bring, MD  DIAGNOSIS: 62 y.o. gentleman with stage T1c adenocarcinoma of the prostate with a Gleason's score of 3+4 and a PSA of 3.48  No diagnosis found.  HISTORY OF PRESENT ILLNESS::Nathan Scott is a 62 y.o. gentleman with a past medical history of pulmonary embolus in fall 2020, requiring surgical intervention, and he is now on lifelong anticoagulation.  He was noted to have a rising PSA from 2.71 in 08/2021 to 3.48 six months later by his primary care physician, Dr. Sabra Heck. He underwent prostate MRI on 03/16/22 showing: PI-RADS 4 lesion of right posterolateral peripheral zone at apex; PI-RADS 3 of right anterior transition and peripheral zones in mid gland. Accordingly, he was referred for evaluation in urology by Dr. Alinda Money on 05/01/22,  digital rectal examination performed at that time showed no nodules.  The patient proceeded to transrectal ultrasound with 12 biopsies of the prostate on 05/14/22.  The prostate volume measured 35.1 cc.  Out of 20 core biopsies, 6 were positive.  The maximum Gleason score was 3+4, and this was seen in right apex and right apex lateral (with perineural invasion). Additionally, Gleason 3+3 was seen in all four cores from ROI #1.  The patient reviewed the biopsy results with his urologist and he has kindly been referred today to the multidisciplinary prostate cancer clinic for presentation of pathology and radiology studies in our conference for discussion of potential radiation treatment options and clinical evaluation.    ***.  PREVIOUS RADIATION THERAPY: No  PAST MEDICAL HISTORY:  has a past medical history of Edema, GERD (gastroesophageal reflux disease),  Hypertension, Pulmonary embolus ([place]), and Sleep apnea.    PAST SURGICAL HISTORY: Past Surgical History:  Procedure Laterality Date   COLONOSCOPY WITH PROPOFOL N/A 02/24/2019   Procedure: COLONOSCOPY WITH PROPOFOL;  Surgeon: Lucilla Lame, MD;  Location: [facility] ENDOSCOPY;  Service: Endoscopy;  Laterality: N/A;   FRACTURE SURGERY Left    pinning leg fx   LEFT HEART CATH AND CORONARY ANGIOGRAPHY Left 01/12/2019   Procedure: LEFT HEART CATH AND CORONARY ANGIOGRAPHY;  Surgeon: Teodoro Spray, MD;  Location: [place] [facility] LAB;  Service: Cardiovascular;  Laterality: Left;   NASAL SEPTOPLASTY W/ TURBINOPLASTY Bilateral 02/20/2016   Procedure: NASAL SEPTOPLASTY WITH TURBINATE REDUCTION;  Surgeon: Margaretha Sheffield, MD;  Location: [facility] ORS;  Service: ENT;  Laterality: Bilateral;   PULMONARY THROMBECTOMY Bilateral 01/22/2019   Procedure: PULMONARY THROMBECTOMY;  Surgeon: Katha Cabal, MD;  Location: [place] [facility] LAB;  Service: Cardiovascular;  Laterality: Bilateral;    FAMILY HISTORY: family history includes Clotting disorder in his paternal grandmother; Heart attack in his father.  SOCIAL HISTORY:  reports that he has never smoked. He has never used smokeless tobacco. He reports that he does not drink alcohol and does not use drugs.  ALLERGIES: Patient has no known allergies.  MEDICATIONS:  Current Outpatient Medications  Medication Sig Dispense Refill   ALPRAZolam (XANAX) 0.5 MG tablet Take 0.5 mg by mouth at bedtime as needed for sleep.      apixaban (ELIQUIS) 5 MG TABS tablet      APPLE CIDER VINEGAR PO Take 3 capsules by mouth daily.      aspirin EC 81 MG tablet Take 81 mg  by mouth daily.     cholecalciferol (VITAMIN D) 25 MCG (1000 UT) tablet Take 1,000 Units by mouth daily.     Chromium Picolinate 800 MCG TABS Take by mouth.     cyclobenzaprine (FLEXERIL) 10 MG tablet Take 10 mg by mouth daily as needed for muscle spasms.      Flaxseed, Linseed, (FLAXSEED OIL) 1200 MG CAPS Take 2,400  mg by mouth daily.     glucosamine-chondroitin 500-400 MG tablet Take 1 tablet by mouth daily.      hydrochlorothiazide (HYDRODIURIL) 25 MG tablet Take 25 mg by mouth daily.      Magnesium Oxide 500 MG TABS Take 500 mg by mouth daily.     mupirocin ointment (BACTROBAN) 2 % Apply 1 application topically daily. Qd to excision site 22 g 1   Omega-3 Fatty Acids (FISH OIL) 1000 MG CAPS Take 2,000 mg by mouth daily.     omeprazole (PRILOSEC) 40 MG capsule Take 40 mg by mouth daily before breakfast.     pyridOXINE (VITAMIN B-6) 100 MG tablet Take 100 mg by mouth daily.     rivaroxaban (XARELTO) 10 MG TABS tablet Take 1 tablet (10 mg total) by mouth daily. 90 tablet 3   sertraline (ZOLOFT) 50 MG tablet Take 50 mg by mouth at bedtime.     simvastatin (ZOCOR) 20 MG tablet Take 20 mg by mouth at bedtime.     temazepam (RESTORIL) 15 MG capsule Take 15 mg by mouth at bedtime as needed.     triamcinolone (NASACORT) 55 MCG/ACT AERO nasal inhaler Place 2 sprays into the nose daily.     triamcinolone cream (KENALOG) 0.1 % Apply 1 application topically 2 (two) times daily as needed (for itching legs.).     Turmeric (QC TUMERIC COMPLEX PO) Take 1,000 mg by mouth.     No current facility-administered medications for this encounter.    REVIEW OF SYSTEMS:  On review of systems, the patient reports that he is doing well overall. He denies any chest pain, shortness of breath, cough, fevers, chills, night sweats, unintended weight changes. He denies any bowel disturbances, and denies abdominal pain, nausea or vomiting. He denies any new musculoskeletal or joint aches or pains. His IPSS was 3, indicating mild urinary symptoms. His SHIM was 25, indicating he does not have erectile dysfunction. A complete review of systems is obtained and is otherwise negative.   PHYSICAL EXAM:  Wt Readings from Last 3 Encounters:  01/08/22 229 lb (103.9 kg)  12/12/20 227 lb (103 kg)  12/08/20 221 lb (100.2 kg)   Temp Readings from  Last 3 Encounters:  02/24/19 (!) 97.4 F (36.3 C) (Tympanic)  02/06/19 (!) 96 F (35.6 C) (Tympanic)  01/23/19 98.2 F (36.8 C) (Oral)   BP Readings from Last 3 Encounters:  01/08/22 130/86  12/12/20 130/82  12/08/20 121/80   Pulse Readings from Last 3 Encounters:  01/08/22 88  12/12/20 74  12/08/20 79    /10  In general this is a well appearing *** man in no acute distress. He's alert and oriented x4 and appropriate throughout the examination. Cardiopulmonary assessment is negative for acute distress and he exhibits normal effort.    KPS = ***  100 - Normal; no complaints; no evidence of disease. 90   - Able to carry on normal activity; minor signs or symptoms of disease. 80   - Normal activity with effort; some signs or symptoms of disease. 32   - Cares for self; unable  to carry on normal activity or to do active work. 60   - Requires occasional assistance, but is able to care for most of his personal needs. 50   - Requires considerable assistance and frequent medical care. 34   - Disabled; requires special care and assistance. 53   - Severely disabled; hospital admission is indicated although death not imminent. 9   - Very sick; hospital admission necessary; active supportive treatment necessary. 10   - Moribund; fatal processes progressing rapidly. 0     - Dead  Karnofsky DA, Abelmann [place], Craver LS and Burchenal [facility] [phone]) The use of the nitrogen mustards in the palliative treatment of carcinoma: with particular reference to bronchogenic carcinoma Cancer 1 634-56   LABORATORY DATA:  Lab Results  Component Value Date   WBC 6.7 01/23/2019   HGB 11.6 (L) 01/23/2019   HCT 34.0 (L) 01/23/2019   MCV 87.2 01/23/2019   PLT 126 (L) 01/23/2019   Lab Results  Component Value Date   NA 136 01/23/2019   K 3.9 01/23/2019   CL 106 01/23/2019   CO2 25 01/23/2019   No results found for: "ALT", "AST", "GGT", "ALKPHOS", "BILITOT"   RADIOGRAPHY: No results found.     IMPRESSION/PLAN: 62 y.o. gentleman with Stage T1c adenocarcinoma of the prostate with a Gleason score of 3+4 and a PSA of 3.48.    We discussed the patient's workup and outlined the nature of prostate cancer in this setting. The patient's T stage, Gleason's score, and PSA put him into the favorable intermediate risk group. Accordingly, he is eligible for a variety of potential treatment options including brachytherapy, 5.5 weeks of external radiation, or prostatectomy. We discussed the available radiation techniques, and focused on the details and logistics of delivery. We discussed and outlined the risks, benefits, short and long-term effects associated with radiotherapy and compared and contrasted these with prostatectomy. We discussed the role of SpaceOAR gel in reducing the rectal toxicity associated with radiotherapy. He appears to have a good understanding of his disease and our treatment recommendations which are of curative intent.  He was encouraged to ask questions that were answered to their stated satisfaction.  At the end of the conversation the patient is interested in moving forward with ***.   We personally spent *** minutes in this encounter including chart review, reviewing radiological studies, meeting face-to-face with the patient, entering orders and completing documentation.    Nicholos Johns, PA-C    Tyler Pita, MD  [facility] Direct Dial: [phone]  Fax: [phone] .com  Skype  [email or web address]   This document serves as a record of services personally performed by Tyler Pita, MD and Freeman Caldron, PA-C. It was created on their behalf by Wilburn Mylar, a trained medical scribe. The creation of this record is based on the scribe's personal observations and the provider's statements to them. This document has been checked and approved by the attending provider.

## 2022-05-25 NOTE — Progress Notes (Signed)
                               Care Plan Summary  Name: Nathan Scott DOB: 1960/08/02   Your Medical Team:   Urologist -  Dr. Raynelle Bring, [facility]  Radiation Oncologist - Dr. Tyler Pita, [place]     Recommendations: 1) Radiation   2) Surgery     * These recommendations are based on information available as of today's consult.      Recommendations may change depending on the results of further tests or exams.    Next Steps: 1) Consider your options and contact Kathlee Nations, your nurse navigator, with any questions or treatment decisions.      When appointments need to be scheduled, you will be contacted by [facility] and/or [facility].  Questions?  Please do not hesitate to call Katheren Puller, BSN, RN at [phone] with any questions or concerns.  Kathlee Nations is your Oncology Nurse Navigator and is available to assist you while you're receiving your medical care at [facility].

## 2022-05-25 NOTE — Progress Notes (Addendum)
REFERRING PROVIDER: Tyler Pita, MD  PRIMARY PROVIDER:  Rusty Aus, MD  PRIMARY REASON FOR VISIT:  1. Malignant neoplasm of prostate ([place])    HISTORY OF PRESENT ILLNESS:   Nathan Scott, a 62 y.o. male, was seen for a [facility] cancer genetics consultation at the request of Dr. Tammi Klippel due to a personal history of cancer.  Nathan Scott presents to clinic today to discuss the possibility of a hereditary predisposition to cancer, to discuss genetic testing, and to further clarify his future cancer risks, as well as potential cancer risks for family members.   Nathan Scott was diagnosed with prostate cancer at age 68 (Gleason score 7).  CANCER HISTORY:  Oncology History  Malignant neoplasm of prostate ([place])  05/14/2022 Cancer Staging   Staging form: Prostate, AJCC 8th Edition - Clinical stage from 05/14/2022: Stage IIB (cT1c, cN0, cM0, PSA: 3.5, Grade Group: 2) - Signed by Freeman Caldron, PA-C on 05/25/2022 Histopathologic type: Adenocarcinoma, NOS Stage prefix: Initial diagnosis Prostate specific antigen (PSA) range: Less than 10 Gleason primary pattern: 3 Gleason secondary pattern: 4 Gleason score: 7 Histologic grading system: 5 grade system Number of biopsy cores examined: 20 Number of biopsy cores positive: 6 Location of positive needle core biopsies: One side   05/25/2022 Initial Diagnosis   Malignant neoplasm of prostate (HCC)      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   GERD (gastroesophageal reflux disease)    Hypertension    Pulmonary embolus (HCC)    Sleep apnea    no cpap x 8 years    Past Surgical History:  Procedure Laterality Date   COLONOSCOPY WITH PROPOFOL N/A 02/24/2019   Procedure: COLONOSCOPY WITH PROPOFOL;  Surgeon: Lucilla Lame, MD;  Location: [facility];  Service: Endoscopy;  Laterality: N/A;   FRACTURE SURGERY Left    pinning leg fx   LEFT HEART CATH AND CORONARY ANGIOGRAPHY Left 01/12/2019   Procedure:  LEFT HEART CATH AND CORONARY ANGIOGRAPHY;  Surgeon: Teodoro Spray, MD;  Location: [facility];  Service: Cardiovascular;  Laterality: Left;   NASAL SEPTOPLASTY W/ TURBINOPLASTY Bilateral 02/20/2016   Procedure: NASAL SEPTOPLASTY WITH TURBINATE REDUCTION;  Surgeon: Margaretha Sheffield, MD;  Location: [facility] ORS;  Service: ENT;  Laterality: Bilateral;   PULMONARY THROMBECTOMY Bilateral 01/22/2019   Procedure: PULMONARY THROMBECTOMY;  Surgeon: Katha Cabal, MD;  Location: [place] [facility];  Service: Cardiovascular;  Laterality: Bilateral;    Social History   Socioeconomic History   Marital status: Married    Spouse name: Not on file   Number of children: Not on file   Years of education: Not on file   Highest education level: Not on file  Occupational History   Not on file  Tobacco Use   Smoking status: Never   Smokeless tobacco: Never  Vaping Use   Vaping Use: Never used  Substance and Sexual Activity   Alcohol use: No   Drug use: No   Sexual activity: Not on file  Other Topics Concern   Not on file  Social History Narrative   Not on file   Social Determinants of Health   Financial Resource Strain: Not on file  Food Insecurity: Not on file  Transportation Needs: Not on file  Physical Activity: Not on file  Stress: Not on file  Social Connections: Not on file     FAMILY HISTORY:  Nathan Scott mother had a history of melanoma, she died at  age 2. His maternal grandfather was diagnosed with lung cancer, he smoked and is deceased. Nathan Scott nephew was diagnosed with stomach cancer, he is deceased. His paternal grandfather was diagnosed with bone cancer at age 13, he died in his 58s. Nathan Scott is unaware of previous family history of genetic testing for hereditary cancer risks and reports no Ashkenazi Jewish ancestry.     GENETIC COUNSELING ASSESSMENT: Nathan Scott is a 62 y.o. male with a personal history of cancer. We, therefore, discussed and  recommended the following at today's visit.   DISCUSSION: We discussed that 5 - 10% of cancer is hereditary, with most cases of prostate cancer associated with BRCA1/2.  There are other genes that can be associated with hereditary prostate cancer syndromes.  We discussed that testing is beneficial for several reasons including knowing how to follow individuals after completing their treatment, identifying whether potential treatment options would be beneficial, and understanding if other family members could be at risk for cancer and allowing them to undergo genetic testing.   We reviewed the characteristics, features and inheritance patterns of hereditary cancer syndromes. We also discussed genetic testing, including the appropriate family members to test, the process of testing, insurance coverage and turn-around-time for results. We discussed the implications of a negative, positive, carrier and/or variant of uncertain significant result.   Nathan Scott elected to have [facility] Common Cancer Panel. The Common Hereditary Cancers Panel offered by [facility] includes sequencing and/or deletion duplication testing of the following 48 genes: APC, ATM, AXIN2, BAP1, BARD1, BMPR1A, BRCA1, BRCA2, BRIP1, CDH1, CDK4, CDKN2A (p14ARF and p16INK4a only), CHEK2, CTNNA1, DICER1, EPCAM (Deletion/duplication testing only), FH, GREM1 (promoter region duplication testing only), HOXB13, KIT, MBD4, MEN1, MLH1, MSH2, MSH3, MSH6, MUTYH, NF1, NHTL1, PALB2, PDGFRA, PMS2, POLD1, POLE, PTEN, RAD51C, RAD51D, SDHA (sequencing analysis only except exon 14), SDHB, SDHC, SDHD, SMAD4, SMARCA4. STK11, TP53, TSC1, TSC2, and VHL.  Based on Nathan Scott personal and family history of cancer, he does not meet medical criteria for genetic testing. He may have an out of pocket cost. We discussed that if his out of pocket cost for testing is over $100, the laboratory will call and confirm whether he wants to proceed with testing.  If the out of  pocket cost of testing is less than $100 he will be billed by the genetic testing laboratory.   PLAN: After considering the risks, benefits, and limitations, Nathan Scott provided informed consent to pursue genetic testing and the blood sample was sent to [facility] for analysis of the Common Cancer Panel. Results should be available within approximately 2-3 weeks' time, at which point they will be disclosed by telephone to Nathan Scott, as will any additional recommendations warranted by these results. Nathan Scott will receive a summary of his genetic counseling visit and a copy of his results once available. This information will also be available in Epic.   Nathan Scott questions were answered to his satisfaction today. Our contact information was provided should additional questions or concerns arise. Thank you for the referral and allowing [place] to share in the care of your patient.   Lucille Passy, MS, [facility] Genetic Counselor [email or web address] (P) [phone]  The patient was seen for a total of 20 minutes in face-to-face genetic counseling.  The patient brought his wife. Drs. Lindi Adie and/or Burr Medico were available to discuss this case as needed.   _______________________________________________________________________ For Office Staff:  Number of people involved in session: 2 Was an Intern/ student involved with case: no

## 2022-05-25 NOTE — Consult Note (Signed)
[facility]     05/25/2022   --------------------------------------------------------------------------------   Eisen Silverman  MRN: [id number]  DOB: 11/19/1960, 62 year old Male  SSN:    PRIMARY CARE:     REFERRING:  Derrek Monaco, M  PROVIDER:  Raynelle Bring, M.D.  LOCATION:  [facility] [phone]     --------------------------------------------------------------------------------   CC/HPI: CC: Prostate Cancer   PCP: Dr. Emily Filbert  Location of consult: [facility]   Mr. Kamai is a 62 year old gentleman with a past medical history significant for GERD, anxiety, arthritis, DVT/PE, and hyperlipidemia. 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 He was not found to have any underlying predisposition to DVT/PE on hematologic evaluation and is on chronic anticoagulation per his CT surgeon in [place]. He saw me initially in mid January 2024 for a rising PSA that had increased from 2.71 in May 2023 to 3.48 on 02/20/22. His PCP ordered a prostate MRI that was done on 03/16/22 and indicated a right apical PI-RADS 4 lesion along with a PI-RADS 3 anterior transition zone lesion. He proceeded with an MR/US fusion biopsy on 05/14/22. This demonstrated Gleason 3+4=7 adenocarcinoma in 2 out of 12 biopsy cores plus 4 out of 4 biopsies positive for Gleason 3+3=6 disease at the PI-RADS 4 lesion in the same area at the right apex. A total of 6 out of 20 biopsies were positive.   Family history: None.   Imaging studies: MRI (03/16/22): No EPE, SVI, LAD, or bone lesion.   PMH: He has a history of GERD, anxiety, arthritis, DVT/PE, and hyperlipidemia  PSH: No abdominal surgeries.   TNM stage: cT1c N0 Mx  PSA: 3.48  Gleason score: 3+4=7 (GG 2)  Biopsy (05/14/22): 6/20 cores positive  Left: Benign  Right: R lateral apex (40%, 3+4=7), R apex  (50%, 3+4=7)  MR targets: ROI-1 (4/4 cores, 3+3=6, 60%, 60%, 60%, 70%), ROI-2 (benign)  Prostate volume: 35.1 cc   Nomogram  OC disease: 74%  EPE: 26%  SVI: 1%  LNI: 2%  PFS (5 year, 10 year): 90%, 83%   Urinary function: IPSS is 7.  Erectile function: SHIM score is 24.     ALLERGIES: No Allergies    MEDICATIONS: Diazepam 10 mg tablet Take 10 mg 30-60 minutes prior to your procedure  Levofloxacin 750 mg tablet Please take one tablet the morning of your biopsy.  ALPRAZOLAM PO Daily  ASPIRIN REGIMEN PO Daily  CYCLOBENZAPRINE HCL PO Daily  NASACORT NS Daily  Omeprazole PO Weekly  SERTRALINE HCL PO Daily  SIMVASTATIN PO Daily  TEMAZEPAM PO Daily  TRIAMCINOLONE ACETONIDE TP Daily  VENLAFAXINE HCL ER PO Daily  XARELTO PO Daily  ZOCOR PO Daily     GU PSH: Circumcision - about 1960 Prostate Needle Biopsy - 05/14/2022 Vasectomy - about 1994     NON-GU PSH: Anesth, Biceps Tendon Repair Anesth, Lower Leg Bone Surg - about 1992 Anesth, Nose/sinus Surgery - about 2017 Colonoscopy - about 2016 Inguinal Hernia Repair < 5 yrs - about 1960 Inquinal hernia repair (open) - about 2014 Laser In Situ Keratomileusis (lasik) - about 2011 Remove Adenoids - about 1972 Surgical Pathology, Gross And Microscopic Examination For Prostate Needle - 05/14/2022 Venous Surgery (Unspecified) - about 2020     GU PMH: Elevated PSA - 05/14/2022, - 05/01/2022, - 07/15/2021 Renal cyst - 05/01/2022, - 2020 Unspecified dysplasia of prostate -  03/16/2022    NON-GU PMH: GERD - 2011 Anxiety Arthritis DVT, History Hyperlipidemia, unspecified 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  Drinks 3 drinks per year. Types of alcohol consumed: Wine. Social Drinker.  Does not use drugs. Drinks 3 caffeinated drinks per day. Has not had a blood transfusion. 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    REVIEW OF SYSTEMS:    GU Review Male:   Patient denies frequent urination, hard to postpone urination, burning/ pain with urination, get up at night to urinate, leakage of urine, stream starts and stops, trouble starting your streams, and have to strain to urinate .  Gastrointestinal (Lower):   Patient denies diarrhea and constipation.  Gastrointestinal (Upper):   Patient denies nausea and vomiting.  Constitutional:   Patient denies fever, night sweats, weight loss, and fatigue.  Skin:   Patient denies skin rash/ lesion and itching.  Eyes:   Patient denies blurred vision and double vision.  Ears/ Nose/ Throat:   Patient denies sore throat and sinus problems.  Hematologic/Lymphatic:   Patient denies swollen glands and easy bruising.  Cardiovascular:   Patient denies leg swelling and chest pains.  Respiratory:   Patient denies cough and shortness of breath.  Endocrine:   Patient denies excessive thirst.  Musculoskeletal:   Patient denies back pain and joint pain.  Neurological:   Patient denies headaches and dizziness.  Psychologic:   Patient denies depression and anxiety.   VITAL SIGNS: None   MULTI-SYSTEM PHYSICAL EXAMINATION:    Constitutional: Well-nourished. No physical deformities. Normally developed. Good grooming.     Complexity of Data:  Lab Test Review:   PSA  Records Review:   Pathology Reports, Previous Patient Records  X-Ray Review: MRI Prostate GSORAD: Reviewed Films.     PROCEDURES: None   ASSESSMENT:      ICD-10 Details  1 GU:    Prostate Cancer - C61    PLAN:           Document Letter(s):  Created for Patient: Clinical Summary         Notes:   1. Favorable intermediate risk prostate cancer: I had a very extensive discussion with Mr. Afifi and his wife today regarding his prostate cancer diagnosis. Taking into account his age/life expectancy and his disease parameters, it was recommended that he strongly consider therapy of curative intent.   The patient was counseled about the natural history of prostate cancer and the standard treatment options that are available for prostate cancer. It was explained to him how his age and life expectancy, clinical stage, Gleason score/prognostic grade group, and PSA (and PSA density) affect his prognosis, the decision to proceed with additional staging studies, as well as how that information influences recommended treatment strategies. We discussed the roles for active surveillance, radiation therapy, surgical therapy, androgen deprivation, as well as ablative therapy and other investigational options for the treatment of prostate cancer as appropriate to his individual cancer situation. We discussed the risks and benefits of these options with regard  to their impact on cancer control and also in terms of potential adverse events, complications, and impact on quality of life particularly related to urinary and sexual function. The patient was encouraged to ask questions throughout the discussion today and all questions were answered to his stated satisfaction. In addition, the patient was provided with and/or directed to appropriate resources and literature for further education about prostate cancer and treatment options. We discussed surgical therapy for prostate cancer including the different available surgical approaches. We discussed, in detail, the risks and expectations of surgery with regard to cancer control, urinary control, and erectile function as well as the expected  postoperative recovery process. Additional risks of surgery including but not limited to bleeding, infection, hernia formation, nerve damage, lymphocele formation, bowel/rectal injury potentially necessitating colostomy, damage to the urinary tract resulting in urine leakage, urethral stricture, and the cardiopulmonary risks such as myocardial infarction, stroke, death, venothromboembolism, etc. were explained. The risk of open surgical conversion for robotic/laparoscopic prostatectomy was also discussed.   He is scheduled to see Dr. Tammi Klippel later this morning to discuss his radiotherapy options. He appears to be somewhat inclined to want to proceed with surgical treatment. If so, my plan would be to perform a bilateral nerve sparing robot-assisted laparoscopic radical prostatectomy and bilateral pelvic lymphadenectomy. He will consider his options and let me know if he decides to proceed with surgical treatment. If so, we certainly will get him scheduled but I will schedule him for a preoperative appointment for an exam and to answer any additional questions before surgery.   CC: Dr. Tyler Pita  Dr. Emily Filbert    E & M CODES: We spent 71 minutes dedicated to evaluation and management time, including face to face interaction, discussions on coordination of care, documentation, result review, and discussion with others as applicable.

## 2022-06-04 NOTE — Progress Notes (Signed)
RN spoke with patient and patients wife to follow up after consult at the [facility] on 2/9.  Education provided on curative rate, and side effects related to surgery and radiation.    Patient remains undecided on his decision at this time.   Will follow up to ensure treatment decision is finalized.

## 2022-06-04 NOTE — Progress Notes (Signed)
RN left voicemail for call back to assess additional questions/treatment decision after his consults at the [facility] on 2/9.

## 2022-06-08 NOTE — Progress Notes (Signed)
RN left voicemail for call back to assess any additional questions, or treatment decisions.

## 2022-06-14 ENCOUNTER — Telehealth: Payer: Self-pay | Admitting: Genetic Counselor

## 2022-06-14 ENCOUNTER — Encounter: Payer: Self-pay | Admitting: Genetic Counselor

## 2022-06-14 DIAGNOSIS — Z1379 Encounter for other screening for genetic and chromosomal anomalies: Secondary | ICD-10-CM | POA: Insufficient documentation

## 2022-06-14 NOTE — Telephone Encounter (Signed)
I contacted Mr. Krauth to discuss his genetic testing results. No pathogenic variants were identified in the 48 genes analyzed. Of note, a variant of uncertain significance was identified in the PMS2 and SDHD genes. Detailed clinic note to follow.  The test report has been scanned into EPIC and is located under the Molecular Pathology section of the Results Review tab.  A portion of the result report is included below for reference.   Lucille Passy, MS, [facility] Genetic Counselor [email or web address] (P) [phone]

## 2022-06-14 NOTE — Progress Notes (Signed)
Patient presented to [facility] on 2/9 for his stage T1c adenocarcinoma of the prostate with a Gleason's score of 3+4 and a PSA of 3.48.   Patient has decided to proceed with SBRT @ [facility].

## 2022-06-15 ENCOUNTER — Encounter (INDEPENDENT_AMBULATORY_CARE_PROVIDER_SITE_OTHER): Payer: Self-pay | Admitting: Vascular Surgery

## 2022-06-21 ENCOUNTER — Ambulatory Visit: Payer: Self-pay | Admitting: Genetic Counselor

## 2022-06-21 ENCOUNTER — Encounter: Payer: Self-pay | Admitting: Genetic Counselor

## 2022-06-21 DIAGNOSIS — Z1379 Encounter for other screening for genetic and chromosomal anomalies: Secondary | ICD-10-CM

## 2022-06-21 NOTE — Progress Notes (Signed)
HPI:   Nathan Scott was previously seen in the [facility] due to a personal and family history of cancer and concerns regarding a hereditary predisposition to cancer. Please refer to our prior cancer genetics clinic note for more information regarding our discussion, assessment and recommendations, at the time. Nathan Scott recent genetic test results were disclosed to him, as were recommendations warranted by these results. These results and recommendations are discussed in more detail below.  CANCER HISTORY:  Oncology History  Malignant neoplasm of prostate ([place])  05/14/2022 Cancer Staging   Staging form: Prostate, AJCC 8th Edition - Clinical stage from 05/14/2022: Stage IIB (cT1c, cN0, cM0, PSA: 3.5, Grade Group: 2) - Signed by Freeman Caldron, PA-C on 05/25/2022 Histopathologic type: Adenocarcinoma, NOS Stage prefix: Initial diagnosis Prostate specific antigen (PSA) range: Less than 10 Gleason primary pattern: 3 Gleason secondary pattern: 4 Gleason score: 7 Histologic grading system: 5 grade system Number of biopsy cores examined: 20 Number of biopsy cores positive: 6 Location of positive needle core biopsies: One side   05/25/2022 Initial Diagnosis   Malignant neoplasm of prostate ([place])     FAMILY HISTORY:  We obtained a detailed, 4-generation family history.  Significant diagnoses are listed below: Family History  Problem Relation Age of Onset   Melanoma Mother    Heart attack Father    Lung cancer Maternal Grandfather        smoked   Clotting disorder Paternal Grandmother    Bone cancer Paternal Grandfather 12   Stomach cancer Nephew    Nathan Scott mother had a history of melanoma, she died at age 2. His maternal grandfather was diagnosed with lung cancer, he smoked and is deceased. Nathan Scott nephew was diagnosed with stomach cancer, he is deceased. His paternal grandfather was diagnosed with bone cancer at age 66, he died in his 78s. [name] is unaware of previous family history of genetic testing for hereditary cancer risks and reports no Ashkenazi Jewish ancestry.       GENETIC TEST RESULTS:  The [facility] Common Cancer Panel found no pathogenic mutations.   The Common Hereditary Cancers Panel offered by [facility] includes sequencing and/or deletion duplication testing of the following 48 genes: APC, ATM, AXIN2, BAP1, BARD1, BMPR1A, BRCA1, BRCA2, BRIP1, CDH1, CDK4, CDKN2A (p14ARF and p16INK4a only), CHEK2, CTNNA1, DICER1, EPCAM (Deletion/duplication testing only), FH, GREM1 (promoter region duplication testing only), HOXB13, KIT, MBD4, MEN1, MLH1, MSH2, MSH3, MSH6, MUTYH, NF1, NHTL1, PALB2, PDGFRA, PMS2, POLD1, POLE, PTEN, RAD51C, RAD51D, SDHA (sequencing analysis only except exon 14), SDHB, SDHC, SDHD, SMAD4, SMARCA4. STK11, TP53, TSC1, TSC2, and VHL.   The test report has been scanned into EPIC and is located under the Molecular Pathology section of the Results Review tab.  A portion of the result report is included below for reference. Genetic testing reported out on 06/08/2022.     Genetic testing identified a variant of uncertain significance (VUS) in the PMS2 gene (c.1243G>T) and SDHD gene (c.398C>G).  At this time, it is unknown if the variants are associated with an increased risk for cancer or if they are benign, but most uncertain variants are reclassified to benign. It should not be used to make medical management decisions. With time, we suspect the laboratory will determine the significance of the variants, if any. If the laboratory reclassifies the variants, we will attempt to contact Nathan Scott to discuss it further.   Even though a pathogenic variant was not identified, possible explanations for the cancer  in the family may include: There may be no hereditary risk for cancer in the family. The cancers in Nathan Scott and/or his family may be due to other genetic or environmental factors. There may be a gene  mutation in one of these genes that current testing methods cannot detect, but that chance is small. There could be another gene that has not yet been discovered, or that we have not yet tested, that is responsible for the cancer diagnoses in the family.   Therefore, it is important to remain in touch with cancer genetics in the future so that we can continue to offer Nathan Scott the most up to date genetic testing.   ADDITIONAL GENETIC TESTING:  We discussed with Nathan Scott that his genetic testing was fairly extensive.  If there are genes identified to increase cancer risk that can be analyzed in the future, we would be happy to discuss and coordinate this testing at that time.    CANCER SCREENING RECOMMENDATIONS:  Nathan Scott test result is considered negative (normal).  This means that we have not identified a hereditary cause for his personal and family history of cancer at this time. Most cancers happen by chance and this negative test suggests that his cancer may fall into this category.    An individual's cancer risk and medical management are not determined by genetic test results alone. Overall cancer risk assessment incorporates additional factors, including personal medical history, family history, and any available genetic information that may result in a personalized plan for cancer prevention and surveillance. Therefore, it is recommended he continue to follow the cancer management and screening guidelines provided by his oncology and primary healthcare provider.  RECOMMENDATIONS FOR FAMILY MEMBERS:   Since he did not inherit a mutation in a cancer predisposition gene included on this panel, his son could not have inherited a mutation from him in one of these genes. We do not recommend familial testing for the PMS2 and SDHD variants of uncertain significance (VUS).  FOLLOW-UP:  Cancer genetics is a rapidly advancing field and it is possible that new genetic tests will be  appropriate for him and/or his family members in the future. We encouraged him to remain in contact with cancer genetics on an annual basis so we can update his personal and family histories and let him know of advances in cancer genetics that may benefit this family.   Our contact number was provided. Nathan Scott questions were answered to his satisfaction, and he knows he is welcome to call us at anytime with additional questions or concerns.   Lucille Passy, MS, [facility] Genetic Counselor [email or web address] (P) [phone]

## 2022-06-25 ENCOUNTER — Ambulatory Visit (INDEPENDENT_AMBULATORY_CARE_PROVIDER_SITE_OTHER): Payer: Commercial Managed Care - PPO | Admitting: Dermatology

## 2022-06-25 ENCOUNTER — Encounter: Payer: Self-pay | Admitting: Dermatology

## 2022-06-25 VITALS — BP 134/89 | HR 74

## 2022-06-25 DIAGNOSIS — R222 Localized swelling, mass and lump, trunk: Secondary | ICD-10-CM | POA: Diagnosis not present

## 2022-06-25 DIAGNOSIS — L820 Inflamed seborrheic keratosis: Secondary | ICD-10-CM | POA: Diagnosis not present

## 2022-06-25 DIAGNOSIS — L814 Other melanin hyperpigmentation: Secondary | ICD-10-CM

## 2022-06-25 DIAGNOSIS — D485 Neoplasm of uncertain behavior of skin: Secondary | ICD-10-CM

## 2022-06-25 DIAGNOSIS — D489 Neoplasm of uncertain behavior, unspecified: Secondary | ICD-10-CM

## 2022-06-25 DIAGNOSIS — L578 Other skin changes due to chronic exposure to nonionizing radiation: Secondary | ICD-10-CM | POA: Diagnosis not present

## 2022-06-25 DIAGNOSIS — L821 Other seborrheic keratosis: Secondary | ICD-10-CM

## 2022-06-25 DIAGNOSIS — D229 Melanocytic nevi, unspecified: Secondary | ICD-10-CM

## 2022-06-25 DIAGNOSIS — Z1283 Encounter for screening for malignant neoplasm of skin: Secondary | ICD-10-CM

## 2022-06-25 NOTE — Patient Instructions (Addendum)
Biopsy Wound Care Instructions  Leave the original bandage on for 24 hours if possible.  If the bandage becomes soaked or soiled before that time, it is OK to remove it and examine the wound.  A small amount of post-operative bleeding is normal.  If excessive bleeding occurs, remove the bandage, place gauze over the site and apply continuous pressure (no peeking) over the area for 30 minutes. If this does not work, please call our clinic as soon as possible or page your doctor if it is after hours.   Once a day, cleanse the wound with soap and water. It is fine to shower. If a thick crust develops you may use a Q-tip dipped into dilute hydrogen peroxide (mix 1:1 with water) to dissolve it.  Hydrogen peroxide can slow the healing process, so use it only as needed.    After washing, apply petroleum jelly (Vaseline) or an antibiotic ointment if your doctor prescribed one for you, followed by a bandage.    For best healing, the wound should be covered with a layer of ointment at all times. If you are not able to keep the area covered with a bandage to hold the ointment in place, this may mean re-applying the ointment several times a day.  Continue this wound care until the wound has healed and is no longer open.   Itching and mild discomfort is normal during the healing process. However, if you develop pain or severe itching, please call our office.   If you have any discomfort, you can take Tylenol (acetaminophen) or ibuprofen as directed on the bottle. (Please do not take these if you have an allergy to them or cannot take them for another reason).  Some redness, tenderness and white or yellow material in the wound is normal healing.  If the area becomes very sore and red, or develops a thick yellow-green material (pus), it may be infected; please notify us.    If you have stitches, return to clinic as directed to have the stitches removed. You will continue wound care for 2-3 days after the stitches  are removed.   Wound healing continues for up to one year following surgery. It is not unusual to experience pain in the scar from time to time during the interval.  If the pain becomes severe or the scar thickens, you should notify the office.    A slight amount of redness in a scar is expected for the first six months.  After six months, the redness will fade and the scar will soften and fade.  The color difference becomes less noticeable with time.  If there are any problems, return for a post-op surgery check at your earliest convenience.  To improve the appearance of the scar, you can use silicone scar gel, cream, or sheets (such as Mederma or Serica) every night for up to one year. These are available over the counter (without a prescription).  Please call our office at [phone] for any questions or concerns.  Cryotherapy Aftercare  Wash gently with soap and water everyday.   Apply Vaseline and Band-Aid daily until healed.   Seborrheic Keratosis  What causes seborrheic keratoses? Seborrheic keratoses are harmless, common skin growths that first appear during adult life.  As time goes by, more growths appear.  Some people may develop a large number of them.  Seborrheic keratoses appear on both covered and uncovered body parts.  They are not caused by sunlight.  The tendency to develop seborrheic keratoses   can be inherited.  They vary in color from skin-colored to gray, brown, or even black.  They can be either smooth or have a rough, warty surface.   Seborrheic keratoses are superficial and look as if they were stuck on the skin.  Under the microscope this type of keratosis looks like layers upon layers of skin.  That is why at times the top layer may seem to fall off, but the rest of the growth remains and re-grows.    Treatment Seborrheic keratoses do not need to be treated, but can easily be removed in the office.  Seborrheic keratoses often cause symptoms when they rub on clothing  or jewelry.  Lesions can be in the way of shaving.  If they become inflamed, they can cause itching, soreness, or burning.  Removal of a seborrheic keratosis can be accomplished by freezing, burning, or surgery. If any spot bleeds, scabs, or grows rapidly, please return to have it checked, as these can be an indication of a skin cancer.    Melanoma ABCDEs  Melanoma is the most dangerous type of skin cancer, and is the leading cause of death from skin disease.  You are more likely to develop melanoma if you: Have light-colored skin, light-colored eyes, or red or blond hair Spend a lot of time in the sun Tan regularly, either outdoors or in a tanning bed Have had blistering sunburns, especially during childhood Have a close family member who has had a melanoma Have atypical moles or large birthmarks  Early detection of melanoma is key since treatment is typically straightforward and cure rates are extremely high if we catch it early.   The first sign of melanoma is often a change in a mole or a new dark spot.  The ABCDE system is a way of remembering the signs of melanoma.  A for asymmetry:  The two halves do not match. B for border:  The edges of the growth are irregular. C for color:  A mixture of colors are present instead of an even brown color. D for diameter:  Melanomas are usually (but not always) greater than 6mm - the size of a pencil eraser. E for evolution:  The spot keeps changing in size, shape, and color.  Please check your skin once per month between visits. You can use a small mirror in front and a large mirror behind you to keep an eye on the back side or your body.   If you see any new or changing lesions before your next follow-up, please call to schedule a visit.  Please continue daily skin protection including broad spectrum sunscreen SPF 30+ to sun-exposed areas, reapplying every 2 hours as needed when you're outdoors.   Staying in the shade or wearing long sleeves,  sun glasses (UVA+UVB protection) and wide brim hats (4-inch brim around the entire circumference of the hat) are also recommended for sun protection.    Due to recent changes in healthcare laws, you may see results of your pathology and/or laboratory studies on MyChart before the doctors have had a chance to review them. We understand that in some cases there may be results that are confusing or concerning to you. Please understand that not all results are received at the same time and often the doctors may need to interpret multiple results in order to provide you with the best plan of care or course of treatment. Therefore, we ask that you please give us 2 business days to thoroughly review all   your results before contacting the office for clarification. Should we see a critical lab result, you will be contacted sooner.   If You Need Anything After Your Visit  If you have any questions or concerns for your doctor, please call our main line at [phone] and press option 4 to reach your doctor's medical assistant. If no one answers, please leave a voicemail as directed and we will return your call as soon as possible. Messages left after 4 pm will be answered the following business day.   You may also send us a message via MyChart. We typically respond to MyChart messages within 1-2 business days.  For prescription refills, please ask your pharmacy to contact our office. Our fax number is [phone].  If you have an urgent issue when the clinic is closed that cannot wait until the next business day, you can page your doctor at the number below.    Please note that while we do our best to be available for urgent issues outside of office hours, we are not available 24/7.   If you have an urgent issue and are unable to reach us, you may choose to seek medical care at your doctor's office, retail clinic, urgent care center, or emergency room.  If you have a medical emergency, please immediately  call 911 or go to the emergency department.  Pager Numbers  - Dr. Kowalski: [phone]  - Dr. Moye: [phone]  - Dr. Stewart: [phone]  In the event of inclement weather, please call our main line at [phone] for an update on the status of any delays or closures.  Dermatology Medication Tips: Please keep the boxes that topical medications come in in order to help keep track of the instructions about where and how to use these. Pharmacies typically print the medication instructions only on the boxes and not directly on the medication tubes.   If your medication is too expensive, please contact our office at [phone] option 4 or send us a message through MyChart.   We are unable to tell what your co-pay for medications will be in advance as this is different depending on your insurance coverage. However, we may be able to find a substitute medication at lower cost or fill out paperwork to get insurance to cover a needed medication.   If a prior authorization is required to get your medication covered by your insurance company, please allow us 1-2 business days to complete this process.  Drug prices often vary depending on where the prescription is filled and some pharmacies may offer cheaper prices.  The website www.goodrx.com contains coupons for medications through different pharmacies. The prices here do not account for what the cost may be with help from insurance (it may be cheaper with your insurance), but the website can give you the price if you did not use any insurance.  - You can print the associated coupon and take it with your prescription to the pharmacy.  - You may also stop by our office during regular business hours and pick up a GoodRx coupon card.  - If you need your prescription sent electronically to a different pharmacy, notify our office through [facility] MyChart or by phone at [phone] option 4.     Si Usted Necesita Algo Despus de Su  Visita  Tambin puede enviarnos un mensaje a travs de MyChart. Por lo general respondemos a los mensajes de MyChart en el transcurso de 1 a 2 das hbiles.  Para renovar recetas, por   favor pida a su farmacia que se ponga en contacto con nuestra oficina. Nuestro nmero de fax es el [phone].  Si tiene un asunto urgente cuando la clnica est cerrada y que no puede esperar hasta el siguiente da hbil, puede llamar/localizar a su doctor(a) al nmero que aparece a continuacin.   Por favor, tenga en cuenta que aunque hacemos todo lo posible para estar disponibles para asuntos urgentes fuera del horario de oficina, no estamos disponibles las 24 horas del da, los 7 das de la semana.   Si tiene un problema urgente y no puede comunicarse con nosotros, puede optar por buscar atencin mdica  en el consultorio de su doctor(a), en una clnica privada, en un centro de atencin urgente o en una sala de emergencias.  Si tiene una emergencia mdica, por favor llame inmediatamente al 911 o vaya a la sala de emergencias.  Nmeros de bper  - Dr. Kowalski: [phone]  - Dra. Moye: [phone]  - Dra. Stewart: [phone]  En caso de inclemencias del tiempo, por favor llame a nuestra lnea principal al [phone] para una actualizacin sobre el estado de cualquier retraso o cierre.  Consejos para la medicacin en dermatologa: Por favor, guarde las cajas en las que vienen los medicamentos de uso tpico para ayudarle a seguir las instrucciones sobre dnde y cmo usarlos. Las farmacias generalmente imprimen las instrucciones del medicamento slo en las cajas y no directamente en los tubos del medicamento.   Si su medicamento es muy caro, por favor, pngase en contacto con nuestra oficina llamando al [phone] y presione la opcin 4 o envenos un mensaje a travs de MyChart.   No podemos decirle cul ser su copago por los medicamentos por adelantado ya que esto es diferente dependiendo de la  cobertura de su seguro. Sin embargo, es posible que podamos encontrar un medicamento sustituto a menor costo o llenar un formulario para que el seguro cubra el medicamento que se considera necesario.   Si se requiere una autorizacin previa para que su compaa de seguros cubra su medicamento, por favor permtanos de 1 a 2 das hbiles para completar este proceso.  Los precios de los medicamentos varan con frecuencia dependiendo del lugar de dnde se surte la receta y alguna farmacias pueden ofrecer precios ms baratos.  El sitio web www.goodrx.com tiene cupones para medicamentos de diferentes farmacias. Los precios aqu no tienen en cuenta lo que podra costar con la ayuda del seguro (puede ser ms barato con su seguro), pero el sitio web puede darle el precio si no utiliz ningn seguro.  - Puede imprimir el cupn correspondiente y llevarlo con su receta a la farmacia.  - Tambin puede pasar por nuestra oficina durante el horario de atencin regular y recoger una tarjeta de cupones de GoodRx.  - Si necesita que su receta se enve electrnicamente a una farmacia diferente, informe a nuestra oficina a travs de MyChart de [facility] o por telfono llamando al [phone] y presione la opcin 4.  

## 2022-06-25 NOTE — Progress Notes (Unsigned)
Follow-Up Visit   Subjective  Nathan Scott is a 62 y.o. male who presents for the following: Annual Exam (1 year tbse, hx of isks, reports a spot at left scalp and right buttock he would like checked. He states spot at right buttock he just noticed a few days ago and is sore when pressed on. Also reports recent diagnosed with prostate cancer, will start radiation treatment in April at [facility]. ). The patient presents for Total-Body Skin Exam (TBSE) for skin cancer screening and mole check.  The patient has spots, moles and lesions to be evaluated, some may be new or changing and the patient has concerns that these could be cancer.  The following portions of the chart were reviewed this encounter and updated as appropriate:  Tobacco  Allergies  Meds  Problems  Med Hx  Surg Hx  Fam Hx     Review of Systems: No other skin or systemic complaints except as noted in HPI or Assessment and Plan.  Objective  Well appearing patient in no apparent distress; mood and affect are within normal limits.  A full examination was performed including scalp, head, eyes, ears, nose, lips, neck, chest, axillae, abdomen, back, buttocks, bilateral upper extremities, bilateral lower extremities, hands, feet, fingers, toes, fingernails, and toenails. All findings within normal limits unless otherwise noted below.  left scalp crown area 3 cm flat papule        left medial thigh near buttock x 1 Erythematous stuck-on, waxy papule or plaque  right buttock 1 cm firm deep papule    Assessment & Plan  Neoplasm of uncertain behavior left scalp crown area Skin / nail biopsy Type of biopsy: tangential   Informed consent: discussed and consent obtained   Timeout: patient name, date of birth, surgical site, and procedure verified   Procedure prep:  Patient was prepped and draped in usual sterile fashion Prep type:  Isopropyl alcohol Anesthesia: the lesion was anesthetized in a standard fashion    Anesthetic:  1% lidocaine w/ epinephrine 1-100,000 buffered w/ 8.4% NaHCO3 Instrument used: flexible razor blade   Hemostasis achieved with: pressure, aluminum chloride and electrodesiccation   Outcome: patient tolerated procedure well   Post-procedure details: sterile dressing applied and wound care instructions given   Dressing type: petrolatum and bandage    Specimen 1 - Surgical pathology Differential Diagnosis: Nevus vs isk r/o ca Check Margins: No Nevus vs isk r/o ca  Inflamed seborrheic keratosis left medial thigh near buttock x 1 Symptomatic, irritating, patient would like treated. Destruction of lesion - left medial thigh near buttock x 1 Complexity: simple   Destruction method: cryotherapy   Informed consent: discussed and consent obtained   Timeout:  patient name, date of birth, surgical site, and procedure verified Lesion destroyed using liquid nitrogen: Yes   Region frozen until ice ball extended beyond lesion: Yes   Outcome: patient tolerated procedure well with no complications   Post-procedure details: wound care instructions given   Additional details:  Prior to procedure, discussed risks of blister formation, small wound, skin dyspigmentation, or rare scar following cryotherapy. Recommend Vaseline ointment to treated areas while healing.  Nodule of buttock approx 1 cm right buttock Benign. Observe Favor nodule from recent prostate procedure vs hematoma from injection vs lipoma Recommend evaluation by oncologist.  Will continue to monitor Lentigines - Scattered tan macules - Due to sun exposure - Benign-appearing, observe - Recommend daily broad spectrum sunscreen SPF 30+ to sun-exposed areas, reapply every 2 hours as needed. -  Call for any changes  Dermatofibroma Right medial calf - Firm pink/brown papulenodule with dimple sign - Benign appearing - Call for any changes  Seborrheic Keratoses - Stuck-on, waxy, tan-brown papules and/or plaques  -  Benign-appearing - Discussed benign etiology and prognosis. - Observe - Call for any changes  Melanocytic Nevi - Tan-brown and/or pink-flesh-colored symmetric macules and papules - Benign appearing on exam today - Observation - Call clinic for new or changing moles - Recommend daily use of broad spectrum spf 30+ sunscreen to sun-exposed areas.   Hemangiomas - Red papules - Discussed benign nature - Observe - Call for any changes  Actinic Damage - Chronic condition, secondary to cumulative UV/sun exposure - diffuse scaly erythematous macules with underlying dyspigmentation - Recommend daily broad spectrum sunscreen SPF 30+ to sun-exposed areas, reapply every 2 hours as needed.  - Staying in the shade or wearing long sleeves, sun glasses (UVA+UVB protection) and wide brim hats (4-inch brim around the entire circumference of the hat) are also recommended for sun protection.  - Call for new or changing lesions.  Prostate Cancer Recent diagnosis Follow up at [facility] for radiation.  Skin cancer screening performed today. Return in about 1 year (around 06/25/2023) for TBSE.  IRuthell Rummage, CMA, am acting as scribe for Sarina Ser, MD. Documentation: I have reviewed the above documentation for accuracy and completeness, and I agree with the above.  Sarina Ser, MD

## 2022-06-26 ENCOUNTER — Encounter: Payer: Self-pay | Admitting: Dermatology

## 2022-07-02 ENCOUNTER — Telehealth: Payer: Self-pay

## 2022-07-02 NOTE — Telephone Encounter (Signed)
-----   Message from Ralene Bathe, MD sent at 06/29/2022  6:33 PM EDT ----- Diagnosis Skin , left scalp crown area PIGMENTED SEBORRHEIC KERATOSIS  Benign keratosis If any remaining, may treat with LN2 at next visit

## 2022-07-02 NOTE — Telephone Encounter (Signed)
Discussed pathology results. Patient voiced understanding.  

## 2022-08-29 ENCOUNTER — Other Ambulatory Visit: Payer: Self-pay | Admitting: Internal Medicine

## 2022-08-29 DIAGNOSIS — Z0000 Encounter for general adult medical examination without abnormal findings: Secondary | ICD-10-CM

## 2022-08-29 DIAGNOSIS — E782 Mixed hyperlipidemia: Secondary | ICD-10-CM

## 2022-08-29 DIAGNOSIS — Z8249 Family history of ischemic heart disease and other diseases of the circulatory system: Secondary | ICD-10-CM

## 2022-09-03 ENCOUNTER — Ambulatory Visit
Admission: RE | Admit: 2022-09-03 | Discharge: 2022-09-03 | Disposition: A | Discharge status: Home / Self Care | Payer: Self-pay | Source: Ambulatory Visit | Attending: Internal Medicine | Admitting: Internal Medicine

## 2022-09-03 DIAGNOSIS — E782 Mixed hyperlipidemia: Secondary | ICD-10-CM

## 2022-09-03 DIAGNOSIS — Z0000 Encounter for general adult medical examination without abnormal findings: Secondary | ICD-10-CM

## 2022-09-03 DIAGNOSIS — Z8249 Family history of ischemic heart disease and other diseases of the circulatory system: Secondary | ICD-10-CM

## 2023-01-10 ENCOUNTER — Ambulatory Visit (INDEPENDENT_AMBULATORY_CARE_PROVIDER_SITE_OTHER): Payer: Commercial Managed Care - PPO | Admitting: Vascular Surgery

## 2023-01-16 ENCOUNTER — Other Ambulatory Visit: Payer: Self-pay | Admitting: Internal Medicine

## 2023-01-16 DIAGNOSIS — R911 Solitary pulmonary nodule: Secondary | ICD-10-CM

## 2023-02-07 ENCOUNTER — Ambulatory Visit (INDEPENDENT_AMBULATORY_CARE_PROVIDER_SITE_OTHER): Payer: Commercial Managed Care - PPO | Admitting: Vascular Surgery

## 2023-02-11 ENCOUNTER — Ambulatory Visit
Admission: RE | Admit: 2023-02-11 | Discharge: 2023-02-11 | Disposition: A | Discharge status: Home / Self Care | Payer: Commercial Managed Care - PPO | Source: Ambulatory Visit | Attending: Internal Medicine | Admitting: Internal Medicine

## 2023-02-11 DIAGNOSIS — R911 Solitary pulmonary nodule: Secondary | ICD-10-CM | POA: Insufficient documentation

## 2023-02-20 NOTE — Progress Notes (Signed)
MRN : [id number]  Nathan Scott is a 62 y.o. ([birth year]) male who presents with chief complaint of history of PE.  History of Present Illness:   The patient presents to the office for routine follow-up evaluation status post pulmonary thrombectomy.    Thrombectomy was performed on 01/22/2019:  Mechanical thrombectomy with the penumbra CAT 12 right upper, middle and lower lobe pulmonary arteries and left lower lobe pulmonary artery.    The presenting symptoms were pleuritic chest pain and profound SOB.    Patient is continue to stay active he continues to work on remodeling his house.  He has also continued to play trumpet on a daily basis.  For the most part he states his breathing is reasonably good.  He denies pleuritic chest pain.  He denies persisting cough or hemoptysis.  He does note that he underwent evaluation for coronary artery disease and had some mild disease which they have elected to treat medically.   He also discussed that Leslea has upcoming extensive cervical spine surgery and this has been a significant stressor as well.   No blood per rectum or blood in any sputum.    No outpatient medications have been marked as taking for the 02/21/23 encounter (Appointment) with Gilda Crease, Latina Craver, MD.    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   GERD (gastroesophageal reflux disease)    Hypertension    Pulmonary embolus (HCC)    Sleep apnea    no cpap x 8 years    Past Surgical History:  Procedure Laterality Date   COLONOSCOPY WITH PROPOFOL N/A 02/24/2019   Procedure: COLONOSCOPY WITH PROPOFOL;  Surgeon: Midge Minium, MD;  Location: [facility] ENDOSCOPY;  Service: Endoscopy;  Laterality: N/A;   FRACTURE SURGERY Left    pinning leg fx   LEFT HEART CATH AND CORONARY ANGIOGRAPHY Left 01/12/2019   Procedure: LEFT HEART CATH AND CORONARY ANGIOGRAPHY;  Surgeon: Dalia Heading, MD;  Location: [facility] INVASIVE CV LAB;  Service: Cardiovascular;   Laterality: Left;   NASAL SEPTOPLASTY W/ TURBINOPLASTY Bilateral 02/20/2016   Procedure: NASAL SEPTOPLASTY WITH TURBINATE REDUCTION;  Surgeon: Vernie Murders, MD;  Location: [facility] ORS;  Service: ENT;  Laterality: Bilateral;   PULMONARY THROMBECTOMY Bilateral 01/22/2019   Procedure: PULMONARY THROMBECTOMY;  Surgeon: Renford Dills, MD;  Location: [facility] INVASIVE CV LAB;  Service: Cardiovascular;  Laterality: Bilateral;    Social History Social History   Tobacco Use   Smoking status: Never   Smokeless tobacco: Never  Vaping Use   Vaping status: Never Used  Substance Use Topics   Alcohol use: No   Drug use: No    Family History Family History  Problem Relation Age of Onset   Melanoma Mother    Heart attack Father    Lung cancer Maternal Grandfather        smoked   Clotting disorder Paternal Grandmother    Bone cancer Paternal Grandfather 21   Stomach cancer Nephew     No Known Allergies   REVIEW OF SYSTEMS (Negative unless checked)  Constitutional: [] Weight loss  [] Fever  [] Chills Cardiac: [] Chest pain   [] Chest pressure   [] Palpitations   [] Shortness of breath when laying flat   [] Shortness of breath with exertion. Vascular:  [] Pain in legs with walking   [] Pain in legs at rest  [] History of DVT   [] Phlebitis   [x] Swelling in legs   [] Varicose veins   [] Non-healing  ulcers Pulmonary:   [] Uses home oxygen   [] Productive cough   [] Hemoptysis   [] Wheeze  [] COPD   [] Asthma Neurologic:  [] Dizziness   [] Seizures   [] History of stroke   [] History of TIA  [] Aphasia   [] Vissual changes   [] Weakness or numbness in arm   [] Weakness or numbness in leg Musculoskeletal:   [] Joint swelling   [] Joint pain   [] Low back pain Hematologic:  [] Easy bruising  [] Easy bleeding   [x] Hypercoagulable state   [] Anemic Gastrointestinal:  [] Diarrhea   [] Vomiting  [x] Gastroesophageal reflux/heartburn   [] Difficulty swallowing. Genitourinary:  [] Chronic kidney disease   [] Difficult urination  [] Frequent  urination   [] Blood in urine Skin:  [] Rashes   [] Ulcers  Psychological:  [] History of anxiety   []  History of major depression.  Physical Examination  There were no vitals filed for this visit. There is no height or weight on file to calculate BMI. Gen: WD/WN, NAD Head: [place]/AT, No temporalis wasting.  Ear/Nose/Throat: Hearing grossly intact, nares w/o erythema or drainage, pinna without lesions Eyes: PER, EOMI, sclera nonicteric.  Neck: Supple, no gross masses.  No JVD.  Pulmonary:  Good air movement, no audible wheezing, no use of accessory muscles.  Cardiac: RRR, precordium not hyperdynamic. Vascular:  scattered varicosities present bilaterally.  Moderate venous stasis changes to the legs bilaterally.  2+ soft pitting edema. CEAP [id number]   Vessel Right Left  Radial Palpable Palpable  Gastrointestinal: soft, non-distended. No guarding/no peritoneal signs.  Musculoskeletal: M/S 5/5 throughout.  No deformity.  Neurologic: CN 2-12 intact. Pain and light touch intact in extremities.  Symmetrical.  Speech is fluent. Motor exam as listed above. Psychiatric: Judgment intact, Mood & affect appropriate for pt's clinical situation. Dermatologic: Venous rashes no ulcers noted.  No changes consistent with cellulitis. Lymph : No lichenification or skin changes of chronic lymphedema.  CBC Lab Results  Component Value Date   WBC 6.7 01/23/2019   HGB 11.6 (L) 01/23/2019   HCT 34.0 (L) 01/23/2019   MCV 87.2 01/23/2019   PLT 126 (L) 01/23/2019    BMET    Component Value Date/Time   NA 136 01/23/2019 0322   NA 140 03/25/2013 1449   K 3.9 01/23/2019 0322   K 3.8 03/25/2013 1449   CL 106 01/23/2019 0322   CL 107 03/25/2013 1449   CO2 25 01/23/2019 0322   CO2 28 03/25/2013 1449   GLUCOSE 114 (H) 01/23/2019 0322   GLUCOSE 84 03/25/2013 1449   BUN 25 (H) 01/23/2019 0322   BUN 18 03/25/2013 1449   CREATININE 0.80 11/01/2020 0916   CREATININE 0.77 03/25/2013 1449   CALCIUM 8.0 (L)  01/23/2019 0322   CALCIUM 9.0 03/25/2013 1449   GFRNONAA >60 01/23/2019 0322   GFRNONAA >60 03/25/2013 1449   GFRAA >60 01/23/2019 0322   GFRAA >60 03/25/2013 1449   CrCl cannot be calculated (Patient's most recent lab result is older than the maximum 21 days allowed.).  COAG Lab Results  Component Value Date   INR 1.1 01/22/2019    Radiology No results found.   Assessment/Plan 1. 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  IVC filter is not indicated at present.   The patient has done quite well on his lifelong anticoagulation therapy with Xarelto.  I will reorder his Xarelto for a year.   Elevation was stressed, such as the use of a recliner.   I have  reviewed DVT and post phlebitic changes such as swelling and why it  causes symptoms such as pain.  The patient should wear graduated compression stockings beginning after three full days of anticoagulation.  The compression should be worn on a daily basis. The patient should wearing the stockings first thing in the morning and removing them in the evening. The patient should not to sleep in the stockings.  In addition, behavioral modification including elevation during the day and avoidance of prolonged dependency will be initiated.     The patient will continue anticoagulation for now as there have not been any problems or complications at this point.   2. Primary hypercoagulable state (HCC) Continue anticoagulation as ordered.  I believe his primary has reordered his Xarelto   3. Hyperlipemia, mixed Continue statin as ordered and reviewed, no changes at this time    Levora Dredge, MD  02/20/2023 9:36 PM

## 2023-02-21 ENCOUNTER — Ambulatory Visit (INDEPENDENT_AMBULATORY_CARE_PROVIDER_SITE_OTHER): Payer: Commercial Managed Care - PPO | Admitting: Vascular Surgery

## 2023-02-21 ENCOUNTER — Encounter (INDEPENDENT_AMBULATORY_CARE_PROVIDER_SITE_OTHER): Payer: Self-pay | Admitting: Vascular Surgery

## 2023-02-21 VITALS — BP 132/84 | HR 67 | Resp 16 | Wt 225.2 lb

## 2023-02-21 DIAGNOSIS — E782 Mixed hyperlipidemia: Secondary | ICD-10-CM | POA: Diagnosis not present

## 2023-02-21 DIAGNOSIS — I2609 Other pulmonary embolism with acute cor pulmonale: Secondary | ICD-10-CM

## 2023-02-21 DIAGNOSIS — D6859 Other primary thrombophilia: Secondary | ICD-10-CM

## 2023-02-21 DIAGNOSIS — I2782 Chronic pulmonary embolism: Secondary | ICD-10-CM | POA: Diagnosis not present

## 2023-02-24 ENCOUNTER — Encounter (INDEPENDENT_AMBULATORY_CARE_PROVIDER_SITE_OTHER): Payer: Self-pay | Admitting: Vascular Surgery

## 2023-06-26 ENCOUNTER — Ambulatory Visit: Payer: Commercial Managed Care - PPO | Admitting: Dermatology

## 2023-07-29 ENCOUNTER — Ambulatory Visit (INDEPENDENT_AMBULATORY_CARE_PROVIDER_SITE_OTHER): Admitting: Dermatology

## 2023-07-29 ENCOUNTER — Encounter: Payer: Self-pay | Admitting: Dermatology

## 2023-07-29 DIAGNOSIS — L905 Scar conditions and fibrosis of skin: Secondary | ICD-10-CM

## 2023-07-29 DIAGNOSIS — D229 Melanocytic nevi, unspecified: Secondary | ICD-10-CM

## 2023-07-29 DIAGNOSIS — Z1283 Encounter for screening for malignant neoplasm of skin: Secondary | ICD-10-CM

## 2023-07-29 DIAGNOSIS — L814 Other melanin hyperpigmentation: Secondary | ICD-10-CM | POA: Diagnosis not present

## 2023-07-29 DIAGNOSIS — L578 Other skin changes due to chronic exposure to nonionizing radiation: Secondary | ICD-10-CM

## 2023-07-29 DIAGNOSIS — Z8546 Personal history of malignant neoplasm of prostate: Secondary | ICD-10-CM

## 2023-07-29 DIAGNOSIS — W908XXA Exposure to other nonionizing radiation, initial encounter: Secondary | ICD-10-CM | POA: Diagnosis not present

## 2023-07-29 DIAGNOSIS — D1801 Hemangioma of skin and subcutaneous tissue: Secondary | ICD-10-CM

## 2023-07-29 DIAGNOSIS — L820 Inflamed seborrheic keratosis: Secondary | ICD-10-CM | POA: Diagnosis not present

## 2023-07-29 DIAGNOSIS — L821 Other seborrheic keratosis: Secondary | ICD-10-CM

## 2023-07-29 NOTE — Progress Notes (Signed)
   Follow-Up Visit   Subjective  Nathan Scott is a 63 y.o. male who presents for the following: Skin Cancer Screening and Full Body Skin Exam The patient presents for Total-Body Skin Exam (TBSE) for skin cancer screening and mole check. The patient has spots, moles and lesions to be evaluated, some may be new or changing and the patient may have concern these could be cancer.  The following portions of the chart were reviewed this encounter and updated as appropriate: medications, allergies, medical history  Review of Systems:  No other skin or systemic complaints except as noted in HPI or Assessment and Plan.  Objective  Well appearing patient in no apparent distress; mood and affect are within normal limits.  A full examination was performed including scalp, head, eyes, ears, nose, lips, neck, chest, axillae, abdomen, back, buttocks, bilateral upper extremities, bilateral lower extremities, hands, feet, fingers, toes, fingernails, and toenails. All findings within normal limits unless otherwise noted below.   Relevant physical exam findings are noted in the Assessment and Plan.  left lateral scalp posterior aspect x 1 ~2 cm Stuck-on, waxy, tan-brown papule  --Discussed benign etiology and prognosis.   Assessment & Plan   SKIN CANCER SCREENING PERFORMED TODAY.  ACTINIC DAMAGE - Chronic condition, secondary to cumulative UV/sun exposure - diffuse scaly erythematous macules with underlying dyspigmentation - Recommend daily broad spectrum sunscreen SPF 30+ to sun-exposed areas, reapply every 2 hours as needed.  - Staying in the shade or wearing long sleeves, sun glasses (UVA+UVB protection) and wide brim hats (4-inch brim around the entire circumference of the hat) are also recommended for sun protection.  - Call for new or changing lesions.  LENTIGINES, SEBORRHEIC KERATOSES, HEMANGIOMAS - Benign normal skin lesions - Benign-appearing - Call for any changes  MELANOCYTIC NEVI -  Tan-brown and/or pink-flesh-colored symmetric macules and papules - Benign appearing on exam today - Observation - Call clinic for new or changing moles - Recommend daily use of broad spectrum spf 30+ sunscreen to sun-exposed areas.   SCAR  Hx of biopsy proven dermatofibroma   buttock Observe    History of Prostate Cancer  INFLAMED SEBORRHEIC KERATOSIS left lateral scalp posterior aspect x 1 Biopsy proven pigmented ISK  Destruction of lesion - left lateral scalp posterior aspect x 1 Complexity: simple   Destruction method: cryotherapy   Informed consent: discussed and consent obtained   Timeout:  patient name, date of birth, surgical site, and procedure verified Lesion destroyed using liquid nitrogen: Yes   Region frozen until ice ball extended beyond lesion: Yes   Outcome: patient tolerated procedure well with no complications   Post-procedure details: wound care instructions given   ACTINIC SKIN DAMAGE   SKIN CANCER SCREENING   LENTIGO   MELANOCYTIC NEVUS, UNSPECIFIED LOCATION     Return in about 1 year (around 07/28/2024) for TBSE, hx of ISKs.  IClara Crisp, CMA, am acting as scribe for Celine Collard, MD .   Documentation: I have reviewed the above documentation for accuracy and completeness, and I agree with the above.  Celine Collard, MD

## 2023-07-29 NOTE — Patient Instructions (Addendum)

## 2023-09-03 ENCOUNTER — Encounter (INDEPENDENT_AMBULATORY_CARE_PROVIDER_SITE_OTHER): Payer: Self-pay

## 2023-09-16 ENCOUNTER — Encounter: Payer: Self-pay | Admitting: Dermatology

## 2023-09-16 ENCOUNTER — Ambulatory Visit (INDEPENDENT_AMBULATORY_CARE_PROVIDER_SITE_OTHER): Admitting: Dermatology

## 2023-09-16 DIAGNOSIS — D492 Neoplasm of unspecified behavior of bone, soft tissue, and skin: Secondary | ICD-10-CM | POA: Diagnosis not present

## 2023-09-16 DIAGNOSIS — L820 Inflamed seborrheic keratosis: Secondary | ICD-10-CM | POA: Diagnosis not present

## 2023-09-16 DIAGNOSIS — L821 Other seborrheic keratosis: Secondary | ICD-10-CM

## 2023-09-16 NOTE — Patient Instructions (Addendum)

## 2023-09-16 NOTE — Progress Notes (Signed)
   Follow-Up Visit   Subjective  Nathan Scott is a 63 y.o. male who presents for the following: Place at R scalp, patient forgot to mention at his last visit. Patient states places are sore and he tends to scratch them. Patient has noticed place at L palm x couple days does not remember injury.   The patient has spots, moles and lesions to be evaluated, some may be new or changing and the patient may have concern these could be cancer.   The following portions of the chart were reviewed this encounter and updated as appropriate: medications, allergies, medical history  Review of Systems:  No other skin or systemic complaints except as noted in HPI or Assessment and Plan.  Objective  Well appearing patient in no apparent distress; mood and affect are within normal limits.  A focused examination was performed of the following areas: Scalp, L palm  Relevant exam findings are noted in the Assessment and Plan.  R parietal scalp x2 (2) Stuck on waxy paps with erythema Left palm 5 mm blue gray subcutaneous nodule    Assessment & Plan   SEBORRHEIC KERATOSIS - Stuck-on, waxy, tan-brown papules and/or plaques  - Benign-appearing - Discussed benign etiology and prognosis. - Observe - Call for any changes  INFLAMED SEBORRHEIC KERATOSIS (2) R parietal scalp x2 (2) Symptomatic, irritating, patient would like treated. Destruction of lesion - R parietal scalp x2 (2) Complexity: simple   Destruction method: cryotherapy   Informed consent: discussed and consent obtained   Timeout:  patient name, date of birth, surgical site, and procedure verified Lesion destroyed using liquid nitrogen: Yes   Region frozen until ice ball extended beyond lesion: Yes   Cryo cycles: 1 or 2. Outcome: patient tolerated procedure well with no complications   Post-procedure details: wound care instructions given   NEOPLASM OF SKIN Left palm Recommend ultrasound to rule out thrombus vs cyst vs other,  patient defers today and observation. Patient will contact us  via MyChart if changes mind about ultrasound.   Denies any known injury or recent trauma to area, patient advised if pain increases, grows in size, or redness increase, or quickly changing patient advised to proceed to ED.  SEBORRHEIC KERATOSES    Return for As scheduled, w/ Dr. Bary Likes.  I, Jacquelynn V. Grier Leber, CMA, am acting as scribe for Harris Liming, MD .   Documentation: I have reviewed the above documentation for accuracy and completeness, and I agree with the above.  Harris Liming, MD

## 2023-10-07 ENCOUNTER — Other Ambulatory Visit: Payer: Self-pay | Admitting: Family Medicine

## 2023-10-07 DIAGNOSIS — R112 Nausea with vomiting, unspecified: Secondary | ICD-10-CM

## 2023-10-09 ENCOUNTER — Ambulatory Visit
Admission: RE | Admit: 2023-10-09 | Discharge: 2023-10-09 | Disposition: A | Discharge status: Home / Self Care | Source: Ambulatory Visit | Attending: Family Medicine | Admitting: Family Medicine

## 2023-10-09 DIAGNOSIS — R112 Nausea with vomiting, unspecified: Secondary | ICD-10-CM | POA: Diagnosis present

## 2023-10-09 MED ORDER — IOHEXOL 300 MG/ML  SOLN
100.0000 mL | Freq: Once | INTRAMUSCULAR | Status: AC | PRN
  Administered 2023-10-09: 100 mL via INTRAVENOUS

## 2024-01-15 ENCOUNTER — Ambulatory Visit
Admission: RE | Admit: 2024-01-15 | Discharge: 2024-01-15 | Disposition: A | Discharge status: Home / Self Care | Attending: Gastroenterology | Admitting: Gastroenterology

## 2024-01-15 ENCOUNTER — Ambulatory Visit

## 2024-01-15 ENCOUNTER — Encounter
Admission: RE | Disposition: A | Discharge status: Home / Self Care | Payer: Self-pay | Source: Home / Self Care | Attending: Gastroenterology

## 2024-01-15 ENCOUNTER — Encounter: Payer: Self-pay | Admitting: Gastroenterology

## 2024-01-15 ENCOUNTER — Other Ambulatory Visit: Payer: Self-pay | Admitting: Gastroenterology

## 2024-01-15 DIAGNOSIS — K2270 Barrett's esophagus without dysplasia: Secondary | ICD-10-CM | POA: Insufficient documentation

## 2024-01-15 DIAGNOSIS — G4730 Sleep apnea, unspecified: Secondary | ICD-10-CM | POA: Diagnosis not present

## 2024-01-15 DIAGNOSIS — K449 Diaphragmatic hernia without obstruction or gangrene: Secondary | ICD-10-CM | POA: Diagnosis not present

## 2024-01-15 DIAGNOSIS — Z86711 Personal history of pulmonary embolism: Secondary | ICD-10-CM | POA: Diagnosis not present

## 2024-01-15 DIAGNOSIS — I10 Essential (primary) hypertension: Secondary | ICD-10-CM | POA: Diagnosis not present

## 2024-01-15 DIAGNOSIS — K219 Gastro-esophageal reflux disease without esophagitis: Secondary | ICD-10-CM | POA: Diagnosis present

## 2024-01-15 HISTORY — PX: ESOPHAGOGASTRODUODENOSCOPY: SHX5428

## 2024-01-15 SURGERY — EGD (ESOPHAGOGASTRODUODENOSCOPY)
Anesthesia: General

## 2024-01-15 MED ORDER — PROPOFOL 10 MG/ML IV BOLUS
INTRAVENOUS | Status: DC | PRN
  Administered 2024-01-15: 30 mg via INTRAVENOUS
  Administered 2024-01-15: 70 mg via INTRAVENOUS
  Administered 2024-01-15: 20 mg via INTRAVENOUS

## 2024-01-15 MED ORDER — DEXMEDETOMIDINE HCL IN NACL 80 MCG/20ML IV SOLN
INTRAVENOUS | Status: DC | PRN
  Administered 2024-01-15: 12 ug via INTRAVENOUS

## 2024-01-15 MED ORDER — LIDOCAINE HCL (CARDIAC) PF 100 MG/5ML IV SOSY
PREFILLED_SYRINGE | INTRAVENOUS | Status: DC | PRN
  Administered 2024-01-15: 60 mg via INTRAVENOUS

## 2024-01-15 MED ORDER — SODIUM CHLORIDE 0.9 % IV SOLN
INTRAVENOUS | Status: DC
  Administered 2024-01-15: 500 mL via INTRAVENOUS

## 2024-01-15 NOTE — Anesthesia Preprocedure Evaluation (Signed)
 Anesthesia Evaluation  Patient identified by MRN, date of birth, ID band Patient awake    Reviewed: Allergy & Precautions, NPO status , Patient's Chart, lab work & pertinent test results  Airway Mallampati: II  TM Distance: >3 FB Neck ROM: full    Dental  (+) Teeth Intact   Pulmonary neg pulmonary ROS, sleep apnea  Hx of pulm. emboli   Pulmonary exam normal breath sounds clear to auscultation       Cardiovascular Exercise Tolerance: Good hypertension, Pt. on medications negative cardio ROS Normal cardiovascular exam Rhythm:Regular Rate:Normal     Neuro/Psych negative neurological ROS  negative psych ROS   GI/Hepatic negative GI ROS, Neg liver ROS,GERD  Medicated,,  Endo/Other  negative endocrine ROS    Renal/GU negative Renal ROS  negative genitourinary   Musculoskeletal negative musculoskeletal ROS (+)    Abdominal   Peds negative pediatric ROS (+)  Hematology negative hematology ROS (+)   Anesthesia Other Findings Past Medical History: No date: Edema     Comment:  dependent in feet No date: GERD (gastroesophageal reflux disease) No date: Hypertension No date: Pulmonary embolus (HCC) No date: Sleep apnea     Comment:  no cpap x 8 years  Past Surgical History: No date: CARDIAC CATHETERIZATION 02/24/2019: COLONOSCOPY WITH PROPOFOL ; N/A     Comment:  Procedure: COLONOSCOPY WITH PROPOFOL ;  Surgeon: Jinny Carmine, MD;  Location: [facility] ENDOSCOPY;  Service:               Endoscopy;  Laterality: N/A; No date: FRACTURE SURGERY; Left     Comment:  pinning leg fx 01/12/2019: LEFT HEART CATH AND CORONARY ANGIOGRAPHY; Left     Comment:  Procedure: LEFT HEART CATH AND CORONARY ANGIOGRAPHY;                Surgeon: Bosie Vinie LABOR, MD;  Location: [facility] INVASIVE CV              LAB;  Service: Cardiovascular;  Laterality: Left; 02/20/2016: NASAL SEPTOPLASTY W/ TURBINOPLASTY; Bilateral     Comment:   Procedure: NASAL SEPTOPLASTY WITH TURBINATE REDUCTION;                Surgeon: Deward Argue, MD;  Location: [facility] ORS;  Service:              ENT;  Laterality: Bilateral; 01/22/2019: PULMONARY THROMBECTOMY; Bilateral     Comment:  Procedure: PULMONARY THROMBECTOMY;  Surgeon: Jama Cordella MATSU, MD;  Location: [facility] INVASIVE CV LAB;  Service:              Cardiovascular;  Laterality: Bilateral;  BMI    Body Mass Index: 28.63 kg/m      Reproductive/Obstetrics negative OB ROS                              Anesthesia Physical Anesthesia Plan  ASA: 2  Anesthesia Plan: General   Post-op Pain Management:    Induction: Intravenous  PONV Risk Score and Plan: Propofol  infusion and TIVA  Airway Management Planned: Natural Airway and Nasal Cannula  Additional Equipment:   Intra-op Plan:   Post-operative Plan:   Informed Consent: I have reviewed the patients History and Physical, chart, labs and discussed the procedure including the risks, benefits and alternatives for the proposed  anesthesia with the patient or authorized representative who has indicated his/her understanding and acceptance.     Dental Advisory Given  Plan Discussed with: CRNA  Anesthesia Plan Comments:         Anesthesia Quick Evaluation

## 2024-01-15 NOTE — Op Note (Signed)
 [facility] Gastroenterology Patient Name: Nathan Scott Procedure Date: 01/15/2024 11:03 AM MRN: [id number] Account #: [id number] Date of Birth: January 27, 1961 Admit Type: Outpatient Age: 63 Room: [facility] ENDO ROOM 3 Gender: Male Note Status: Finalized Instrument Name: Upper GI Scope [id number] Procedure:             Upper GI endoscopy Indications:           Follow-up of gastro-esophageal reflux disease Providers:             Ruel Kung MD, MD Referring MD:          Oneil PHEBE Pinal, MD (Referring MD) Medicines:             Monitored Anesthesia Care Complications:         No immediate complications. Procedure:             Pre-Anesthesia Assessment:                        - Prior to the procedure, a History and Physical was                         performed, and patient medications, allergies and                         sensitivities were reviewed. The patient's tolerance                         of previous anesthesia was reviewed.                        - The risks and benefits of the procedure and the                         sedation options and risks were discussed with the                         patient. All questions were answered and informed                         consent was obtained.                        - ASA Grade Assessment: II - A patient with mild                         systemic disease.                        After obtaining informed consent, the endoscope was                         passed under direct vision. Throughout the procedure,                         the patient's blood pressure, pulse, and oxygen                         saturations were monitored continuously. The Endoscope  was introduced through the mouth, and advanced to the                         third part of duodenum. The upper GI endoscopy was                         accomplished with ease. The patient tolerated the                         procedure  well. Findings:      The examined duodenum was normal.      A small hiatal hernia was present.      The cardia and gastric fundus were normal on retroflexion.      Two tongues of salmon-colored mucosa were present from 39 to 40 cm. No       other visible abnormalities were present. The maximum longitudinal       extent of these esophageal mucosal changes was 1 cm in length. Biopsies       were taken with a cold forceps for histology. Impression:            - Normal examined duodenum.                        - Small hiatal hernia.                        - Salmon-colored mucosa suspicious for short-segment                         Barrett's esophagus. Biopsied. Recommendation:        - Await pathology results.                        - Discharge patient to home (with escort).                        - Resume previous diet.                        - Continue present medications.                        - Await pathology results.                        - Repeat upper endoscopy after studies are complete                         for surveillance based on pathology results. Procedure Code(s):     --- Professional ---                        [phone], Esophagogastroduodenoscopy, flexible,                         transoral; with biopsy, single or multiple Diagnosis Code(s):     --- Professional ---                        K22.89, Other specified disease of esophagus  K44.9, Diaphragmatic hernia without obstruction or                         gangrene                        K21.9, Gastro-esophageal reflux disease without                         esophagitis CPT copyright 2022 American Medical Association. All rights reserved. The codes documented in this report are preliminary and upon coder review may  be revised to meet current compliance requirements. Ruel Kung, MD Ruel Kung MD, MD 01/15/2024 11:24:12 AM This report has been signed electronically. Number of Addenda: 0 Note  Initiated On: 01/15/2024 11:03 AM Estimated Blood Loss:  Estimated blood loss: none.      [facility]

## 2024-01-15 NOTE — Transfer of Care (Signed)
 Immediate Anesthesia Transfer of Care Note  Patient: Nathan Scott  Procedure(s) Performed: EGD (ESOPHAGOGASTRODUODENOSCOPY)  Patient Location: PACU  Anesthesia Type:General  Level of Consciousness: awake, alert , and oriented  Airway & Oxygen Therapy: Patient Spontanous Breathing  Post-op Assessment: Report given to RN and Post -op Vital signs reviewed and stable  Post vital signs: Reviewed and stable  Last Vitals:  Vitals Value Taken Time  BP 110/81 01/15/24 11:23  Temp    Pulse 71 01/15/24 11:24  Resp 17 01/15/24 11:24  SpO2 96 % 01/15/24 11:24  Vitals shown include unfiled device data.  Last Pain:  Vitals:   01/15/24 1123  TempSrc:   PainSc: Asleep         Complications: No notable events documented.

## 2024-01-15 NOTE — H&P (Signed)
 Ruel Kung , MD [address], [address], [place], [place], [address] Phone: [phone] Fax: [phone]  Primary Care Physician:  Cleotilde Oneil FALCON, MD   Pre-Procedure History & Physical: HPI:  Nathan Scott is a 63 y.o. male is here for an endoscopy    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   GERD (gastroesophageal reflux disease)    Hypertension    Pulmonary embolus (HCC)    Sleep apnea    no cpap x 8 years    Past Surgical History:  Procedure Laterality Date   COLONOSCOPY WITH PROPOFOL  N/A 02/24/2019   Procedure: COLONOSCOPY WITH PROPOFOL ;  Surgeon: Jinny Carmine, MD;  Location: [facility] ENDOSCOPY;  Service: Endoscopy;  Laterality: N/A;   FRACTURE SURGERY Left    pinning leg fx   LEFT HEART CATH AND CORONARY ANGIOGRAPHY Left 01/12/2019   Procedure: LEFT HEART CATH AND CORONARY ANGIOGRAPHY;  Surgeon: Bosie Vinie LABOR, MD;  Location: [facility] INVASIVE CV LAB;  Service: Cardiovascular;  Laterality: Left;   NASAL SEPTOPLASTY W/ TURBINOPLASTY Bilateral 02/20/2016   Procedure: NASAL SEPTOPLASTY WITH TURBINATE REDUCTION;  Surgeon: Deward Argue, MD;  Location: [facility] ORS;  Service: ENT;  Laterality: Bilateral;   PULMONARY THROMBECTOMY Bilateral 01/22/2019   Procedure: PULMONARY THROMBECTOMY;  Surgeon: Jama Cordella MATSU, MD;  Location: [facility] INVASIVE CV LAB;  Service: Cardiovascular;  Laterality: Bilateral;    Prior to Admission medications   Medication Sig Start Date End Date Taking? Authorizing Provider  ALPRAZolam  (XANAX ) 0.5 MG tablet Take 0.5 mg by mouth at bedtime as needed for sleep.     [provider]  apixaban  (ELIQUIS ) 5 MG TABS tablet     [provider]  APPLE CIDER VINEGAR PO Take 3 capsules by mouth daily.     [provider]  cholecalciferol  (VITAMIN D ) 25 MCG (1000 UT) tablet Take 1,000 Units by mouth daily.    [provider]  Chromium Picolinate 800 MCG TABS Take by mouth.    [provider]   cyclobenzaprine  (FLEXERIL ) 10 MG tablet Take 10 mg by mouth daily as needed for muscle spasms.     [provider]  Flaxseed, Linseed, (FLAXSEED OIL) 1200 MG CAPS Take 2,400 mg by mouth daily.    [provider]  glucosamine-chondroitin 500-400 MG tablet Take 1 tablet by mouth daily.     [provider]  hydrochlorothiazide  (HYDRODIURIL ) 25 MG tablet Take 25 mg by mouth daily.  07/04/15   [provider]  Magnesium  Oxide 500 MG TABS Take 500 mg by mouth daily.    [provider]  Omega-3 Fatty Acids (FISH OIL) 1000 MG CAPS Take 2,000 mg by mouth daily.    [provider]  omeprazole (PRILOSEC) 40 MG capsule Take 40 mg by mouth daily before breakfast. 11/12/15   [provider]  pyridOXINE (VITAMIN B-6) 100 MG tablet Take 100 mg by mouth daily.    [provider]  rivaroxaban  (XARELTO ) 10 MG TABS tablet Take 1 tablet (10 mg total) by mouth daily. 01/12/22   Schnier, Cordella MATSU, MD  sertraline  (ZOLOFT ) 50 MG tablet Take 50 mg by mouth at bedtime.    [provider]  simvastatin  (ZOCOR ) 20 MG tablet Take 20 mg by mouth at bedtime.    [provider]  temazepam (RESTORIL) 15 MG capsule Take 15 mg by mouth at bedtime as needed. 06/11/20   [provider]  triamcinolone  (NASACORT ) 55 MCG/ACT AERO nasal  inhaler Place 2 sprays into the nose daily.    [provider]  triamcinolone  cream (KENALOG ) 0.1 % Apply 1 application topically 2 (two) times daily as needed (for itching legs.).    [provider]  Turmeric (QC TUMERIC COMPLEX PO) Take 1,000 mg by mouth.    [provider]    Allergies as of 01/04/2024   (No Known Allergies)    Family History  Problem Relation Age of Onset   Melanoma Mother    Heart attack Father    Lung cancer Maternal Grandfather        smoked   Clotting disorder Paternal Grandmother    Bone cancer Paternal Grandfather 19   Stomach cancer Nephew      Social History   Socioeconomic History   Marital status: Married    Spouse name: Not on file   Number of children: Not on file   Years of education: Not on file   Highest education level: Not on file  Occupational History   Not on file  Tobacco Use   Smoking status: Never   Smokeless tobacco: Never  Vaping Use   Vaping status: Never Used  Substance and Sexual Activity   Alcohol use: No   Drug use: No   Sexual activity: Not on file  Other Topics Concern   Not on file  Social History Narrative   Not on file   Social Drivers of Health   Financial Resource Strain: Low Risk  (08/27/2023)   Received from [facility]   Overall Financial Resource Strain (CARDIA)    Difficulty of Paying Living Expenses: Not hard at all  Food Insecurity: No Food Insecurity (12/31/2023)   Received from [facility]   Hunger Vital Sign    Within the past 12 months, you worried that your food would run out before you got the money to buy more.: Never true    Within the past 12 months, the food you bought just didn't last and you didn't have money to get more.: Never true  Transportation Needs: No Transportation Needs (12/31/2023)   Received from [facility] [place] - Transportation    In the past 12 months, has lack of transportation kept you from medical appointments or from getting medications?: No    Lack of Transportation (Non-Medical): No  Physical Activity: Not on file  Stress: Not on file  Social Connections: Not on file  Intimate Partner Violence: Not on file    Review of Systems: See HPI, otherwise negative ROS  Physical Exam: There were no vitals taken for this visit. General:   Alert,  pleasant and cooperative in NAD Head:  Normocephalic and atraumatic. Neck:  Supple; no masses or thyromegaly. Lungs:  Clear throughout to auscultation, normal respiratory effort.    Heart:  +S1, +S2, Regular rate and rhythm, No  edema. Abdomen:  Soft, nontender and nondistended. Normal bowel sounds, without guarding, and without rebound.   Neurologic:  Alert and  oriented x4;  grossly normal neurologically.  Impression/Plan: Nathan Scott is here for an endoscopy  to be performed for  evaluation of GERD    Risks, benefits, limitations, and alternatives regarding endoscopy have been reviewed with the patient.  Questions have been answered.  All parties agreeable.   Ruel Kung, MD  01/15/2024, 10:44 AM

## 2024-01-15 NOTE — Anesthesia Postprocedure Evaluation (Signed)
 Anesthesia Post Note  Patient: Nathan Scott  Procedure(s) Performed: EGD (ESOPHAGOGASTRODUODENOSCOPY)  Patient location during evaluation: PACU Anesthesia Type: General Level of consciousness: awake Pain management: satisfactory to patient Vital Signs Assessment: post-procedure vital signs reviewed and stable Respiratory status: spontaneous breathing Cardiovascular status: stable Anesthetic complications: no   No notable events documented.   Last Vitals:  Vitals:   01/15/24 1123 01/15/24 1133  BP: 110/81 110/89  Pulse: 73 77  Resp: 17 15  Temp:    SpO2: 97% 95%    Last Pain:  Vitals:   01/15/24 1133  TempSrc:   PainSc: 0-No pain                 VAN STAVEREN,Shana Zavaleta

## 2024-01-16 LAB — SURGICAL PATHOLOGY

## 2024-02-12 ENCOUNTER — Ambulatory Visit

## 2024-02-12 DIAGNOSIS — L820 Inflamed seborrheic keratosis: Secondary | ICD-10-CM | POA: Diagnosis not present

## 2024-02-12 DIAGNOSIS — L578 Other skin changes due to chronic exposure to nonionizing radiation: Secondary | ICD-10-CM | POA: Diagnosis not present

## 2024-02-12 DIAGNOSIS — W908XXA Exposure to other nonionizing radiation, initial encounter: Secondary | ICD-10-CM

## 2024-02-12 NOTE — Progress Notes (Signed)
    Subjective   Nathan Scott is a 63 y.o. male who presents for the following: Lesion(s) of concern . Patient is established patient .  Today patient reports: Lesion of concern at left scalp present for a while, place at left temple x couple of months. At times itches and glasses irritate it. Patient states places have been treated with LN2 in the past. No hx of skin cancer.   Review of Systems:    No other skin or systemic complaints except as noted in HPI or Assessment and Plan.  The following portions of the chart were reviewed this encounter and updated as appropriate: medications, allergies, medical history  Relevant Medical History:  n/a   Objective  Well appearing patient in no apparent distress; mood and affect are within normal limits. Examination was performed of the: Focused Exam of: Scalp   Examination notable for: Seborrheic Keratosis(es): Stuck-on appearing keratotic papule(s) on the trunk, some  irritated with redness, crusting, edema, and/or partial avulsion, Actinic Damage/Elastosis: chronic sun damage: dyspigmentation, telangiectasia, and wrinkling  Examination limited by: Undergarments, Shoes or socks , Clothing, and Patient deferred removal     Left scalp x1, left temple x1 (2) Stuck on waxy paps with erythema  Assessment & Plan   Benign Lesions/ Findings:  -Seborrheic Keratosis - Reassurance provided regarding the benign appearance of lesions noted on exam today; no treatment is indicated in the absence of symptoms/changes. - Reinforced importance of photoprotective strategies including liberal and frequent sunscreen use of a broad-spectrum SPF 30 or greater, use of protective clothing, and sun avoidance for prevention of cutaneous malignancy and photoaging.  Counseled patient on the importance of regular self-skin monitoring as well as routine clinical skin examinations as scheduled.    ACTINIC DAMAGE - Chronic condition, secondary to cumulative UV/sun  exposure - Recommend daily broad spectrum sunscreen SPF 30+ to sun-exposed areas, reapply every 2 hours as needed.  - Staying in the shade or wearing long sleeves, sun glasses (UVA+UVB protection) and wide brim hats (4-inch brim around the entire circumference of the hat) are also recommended for sun protection.  - Call for new or changing lesions.  Level of service outlined above   Procedures, orders, diagnosis for this visit:  INFLAMED SEBORRHEIC KERATOSIS (2) Left scalp x1, left temple x1 (2) Symptomatic, irritating, patient would like treated. Destruction of lesion - Left scalp x1, left temple x1 (2) Complexity: simple   Destruction method: cryotherapy   Informed consent: discussed and consent obtained   Timeout:  patient name, date of birth, surgical site, and procedure verified Lesion destroyed using liquid nitrogen: Yes   Region frozen until ice ball extended beyond lesion: Yes   Cryo cycles: 1 or 2. Outcome: patient tolerated procedure well with no complications   Post-procedure details: wound care instructions given   Additional details:  Prior to procedure, discussed risks of blister formation, small wound, skin dyspigmentation, or rare scar following cryotherapy. Recommend Vaseline ointment to treated areas while healing.    Inflamed seborrheic keratosis -     Destruction of lesion    Return to clinic: Return for As scheduled, w/ Dr. Hester.  I, Jacquelynn V. Wilfred, CMA, am acting as scribe for Lauraine JAYSON Kanaris, MD.  Documentation: I have reviewed the above documentation for accuracy and completeness, and I agree with the above.  Lauraine JAYSON Kanaris, MD

## 2024-02-12 NOTE — Patient Instructions (Addendum)

## 2024-02-24 ENCOUNTER — Ambulatory Visit (INDEPENDENT_AMBULATORY_CARE_PROVIDER_SITE_OTHER): Payer: Commercial Managed Care - PPO | Admitting: Vascular Surgery

## 2024-02-24 ENCOUNTER — Encounter (INDEPENDENT_AMBULATORY_CARE_PROVIDER_SITE_OTHER): Payer: Self-pay | Admitting: Vascular Surgery

## 2024-02-24 VITALS — BP 131/86 | HR 64 | Resp 18 | Ht 73.0 in | Wt 227.2 lb

## 2024-02-24 DIAGNOSIS — D6859 Other primary thrombophilia: Secondary | ICD-10-CM

## 2024-02-24 DIAGNOSIS — K219 Gastro-esophageal reflux disease without esophagitis: Secondary | ICD-10-CM | POA: Diagnosis not present

## 2024-02-24 DIAGNOSIS — I2782 Chronic pulmonary embolism: Secondary | ICD-10-CM

## 2024-02-24 DIAGNOSIS — E782 Mixed hyperlipidemia: Secondary | ICD-10-CM

## 2024-02-24 DIAGNOSIS — I2609 Other pulmonary embolism with acute cor pulmonale: Secondary | ICD-10-CM

## 2024-03-04 ENCOUNTER — Encounter (INDEPENDENT_AMBULATORY_CARE_PROVIDER_SITE_OTHER): Payer: Self-pay | Admitting: Vascular Surgery

## 2024-03-04 NOTE — Progress Notes (Signed)
 MRN : [id number]  Nathan Scott is a 63 y.o. (Jun 16, 1960) male who presents with chief complaint of legs hurt and swell.  History of Present Illness:   The patient presents to the office for routine follow-up evaluation status post pulmonary thrombectomy.    Thrombectomy was performed on 01/22/2019:  Mechanical thrombectomy with the penumbra CAT 12 right upper, middle and lower lobe pulmonary arteries and left lower lobe pulmonary artery.    The presenting symptoms were pleuritic chest pain and profound SOB.    Patient is continue to stay active he continues to work on remodeling his house.  He has also continued to play trumpet on a daily basis.  For the most part he states his breathing is reasonably good.  He denies pleuritic chest pain.  He denies persisting cough or hemoptysis.  Previously he underwent evaluation for coronary artery disease and had some mild disease which they have elected to treat medically.    No blood per rectum or blood in any sputum.    Current Meds  Medication Sig   ALPRAZolam  (XANAX ) 0.5 MG tablet Take 0.5 mg by mouth at bedtime as needed for sleep.    APPLE CIDER VINEGAR PO Take 3 capsules by mouth daily.    cholecalciferol  (VITAMIN D ) 25 MCG (1000 UT) tablet Take 1,000 Units by mouth daily.   Chromium Picolinate 800 MCG TABS Take by mouth.   cyclobenzaprine  (FLEXERIL ) 10 MG tablet Take 10 mg by mouth daily as needed for muscle spasms.    Flaxseed, Linseed, (FLAXSEED OIL) 1200 MG CAPS Take 2,400 mg by mouth daily.   glucosamine-chondroitin 500-400 MG tablet Take 1 tablet by mouth daily.    hydrochlorothiazide  (HYDRODIURIL ) 25 MG tablet Take 25 mg by mouth daily.    Magnesium  Oxide 500 MG TABS Take 500 mg by mouth daily.   Omega-3 Fatty Acids (FISH OIL) 1000 MG CAPS Take 2,000 mg by mouth daily.   omeprazole (PRILOSEC) 40 MG capsule Take 40 mg by mouth daily before breakfast.   pyridOXINE (VITAMIN B-6) 100 MG tablet Take 100 mg by mouth daily.    rivaroxaban  (XARELTO ) 10 MG TABS tablet Take 1 tablet (10 mg total) by mouth daily.   sertraline  (ZOLOFT ) 50 MG tablet Take 50 mg by mouth at bedtime.   simvastatin  (ZOCOR ) 20 MG tablet Take 20 mg by mouth at bedtime.   temazepam (RESTORIL) 15 MG capsule Take 15 mg by mouth at bedtime as needed.   triamcinolone  (NASACORT ) 55 MCG/ACT AERO nasal inhaler Place 2 sprays into the nose daily.   triamcinolone  cream (KENALOG ) 0.1 % Apply 1 application topically 2 (two) times daily as needed (for itching legs.).   Turmeric (QC TUMERIC COMPLEX PO) Take 1,000 mg by mouth.    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   GERD (gastroesophageal reflux disease)    Hypertension    Pulmonary embolus (HCC)    Sleep apnea    no cpap x 8 years    Past Surgical History:  Procedure Laterality Date   CARDIAC CATHETERIZATION     COLONOSCOPY WITH PROPOFOL  N/A 02/24/2019   Procedure: COLONOSCOPY WITH PROPOFOL ;  Surgeon: Jinny Carmine, MD;  Location: [facility] ENDOSCOPY;  Service: Endoscopy;  Laterality: N/A;   ESOPHAGOGASTRODUODENOSCOPY N/A 01/15/2024   Procedure: EGD (ESOPHAGOGASTRODUODENOSCOPY);  Surgeon: Therisa Bi, MD;  Location: [facility] ENDOSCOPY;  Service: Gastroenterology;  Laterality: N/A;  Xarelto    FRACTURE SURGERY Left  pinning leg fx   LEFT HEART CATH AND CORONARY ANGIOGRAPHY Left 01/12/2019   Procedure: LEFT HEART CATH AND CORONARY ANGIOGRAPHY;  Surgeon: Bosie Vinie LABOR, MD;  Location: [facility] INVASIVE CV LAB;  Service: Cardiovascular;  Laterality: Left;   NASAL SEPTOPLASTY W/ TURBINOPLASTY Bilateral 02/20/2016   Procedure: NASAL SEPTOPLASTY WITH TURBINATE REDUCTION;  Surgeon: Deward Argue, MD;  Location: [facility] ORS;  Service: ENT;  Laterality: Bilateral;   PULMONARY THROMBECTOMY Bilateral 01/22/2019   Procedure: PULMONARY THROMBECTOMY;  Surgeon: Jama Cordella MATSU, MD;  Location: [facility] INV[facility]IVE CV LAB;  Service: Cardiovascular;  Laterality: Bilateral;    Social History Social  History   Tobacco Use   Smoking status: Never   Smokeless tobacco: Never  Vaping Use   Vaping status: Never Used  Substance Use Topics   Alcohol use: No   Drug use: No    Family History Family History  Problem Relation Age of Onset   Melanoma Mother    Heart attack Father    Lung cancer Maternal Grandfather        smoked   Clotting disorder Paternal Grandmother    Bone cancer Paternal Grandfather 21   Stomach cancer Nephew     No Known Allergies   REVIEW OF SYSTEMS (Negative unless checked)  Constitutional: [] Weight loss  [] Fever  [] Chills Cardiac: [] Chest pain   [] Chest pressure   [] Palpitations   [] Shortness of breath when laying flat   [] Shortness of breath with exertion. Vascular:  [] Pain in legs with walking   [x] Pain in legs at rest  [] History of DVT   [] Phlebitis   [x] Swelling in legs   [] Varicose veins   [] Non-healing ulcers Pulmonary:   [] Uses home oxygen   [] Productive cough   [] Hemoptysis   [] Wheeze  [] COPD   [] Asthma Neurologic:  [] Dizziness   [] Seizures   [] History of stroke   [] History of TIA  [] Aphasia   [] Vissual changes   [] Weakness or numbness in arm   [] Weakness or numbness in leg Musculoskeletal:   [] Joint swelling   [] Joint pain   [] Low back pain Hematologic:  [] Easy bruising  [] Easy bleeding   [] Hypercoagulable state   [] Anemic Gastrointestinal:  [] Diarrhea   [] Vomiting  [x] Gastroesophageal reflux/heartburn   [] Difficulty swallowing. Genitourinary:  [] Chronic kidney disease   [] Difficult urination  [] Frequent urination   [] Blood in urine Skin:  [] Rashes   [] Ulcers  Psychological:  [] History of anxiety   []  History of major depression.  Physical Examination  Vitals:   02/24/24 1605  BP: 131/86  Pulse: 64  Resp: 18  Weight: 227 lb 3.2 oz (103.1 kg)  Height: 6' 1 (1.854 m)   Body mass index is 29.98 kg/m. Gen: WD/WN, NAD Head: [place]/AT, No temporalis wasting.  Ear/Nose/Throat: Hearing grossly intact, nares w/o erythema or drainage, pinna without  lesions Eyes: PER, EOMI, sclera nonicteric.  Neck: Supple, no gross masses.  No JVD.  Pulmonary:  Good air movement, no audible wheezing, no use of accessory muscles.  Cardiac: RRR, precordium not hyperdynamic. Vascular:  scattered varicosities present bilaterally.  Moderate venous stasis changes to the legs bilaterally.  2+ soft pitting edema. CEAP [id number]   Vessel Right Left  Radial Palpable Palpable  Gastrointestinal: soft, non-distended. No guarding/no peritoneal signs.  Musculoskeletal: M/S 5/5 throughout.  No deformity.  Neurologic: CN 2-12 intact. Pain and light touch intact in extremities.  Symmetrical.  Speech is fluent. Motor exam as listed above. Psychiatric: Judgment intact, Mood & affect appropriate for pt's clinical situation. Dermatologic: Venous rashes no ulcers  noted.  No changes consistent with cellulitis. Lymph : No lichenification or skin changes of chronic lymphedema.  CBC Lab Results  Component Value Date   WBC 6.7 01/23/2019   HGB 11.6 (L) 01/23/2019   HCT 34.0 (L) 01/23/2019   MCV 87.2 01/23/2019   PLT 126 (L) 01/23/2019    BMET    Component Value Date/Time   NA 136 01/23/2019 0322   NA 140 03/25/2013 1449   K 3.9 01/23/2019 0322   K 3.8 03/25/2013 1449   CL 106 01/23/2019 0322   CL 107 03/25/2013 1449   CO2 25 01/23/2019 0322   CO2 28 03/25/2013 1449   GLUCOSE 114 (H) 01/23/2019 0322   GLUCOSE 84 03/25/2013 1449   BUN 25 (H) 01/23/2019 0322   BUN 18 03/25/2013 1449   CREATININE 0.80 11/01/2020 0916   CREATININE 0.77 03/25/2013 1449   CALCIUM 8.0 (L) 01/23/2019 0322   CALCIUM 9.0 03/25/2013 1449   GFRNONAA >60 01/23/2019 0322   GFRNONAA >60 03/25/2013 1449   GFRAA >60 01/23/2019 0322   GFRAA >60 03/25/2013 1449   CrCl cannot be calculated (Patient's most recent lab result is older than the maximum 21 days allowed.).  COAG Lab Results  Component Value Date   INR 1.1 01/22/2019    Radiology No results found.   Assessment/Plan 1.  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  IVC filter is not indicated at present.   The patient has done quite well on his lifelong anticoagulation therapy with Xarelto .  I will reorder his Xarelto  for a year.   Elevation was stressed, such as the use of a recliner.   I have reviewed DVT and post phlebitic changes such as swelling and why it  causes symptoms such as pain.  The patient should wear graduated compression stockings beginning after three full days of anticoagulation.  The compression should be worn on a daily basis. The patient should wearing the stockings first thing in the morning and removing them in the evening. The patient should not to sleep in the stockings.  In addition, behavioral modification including elevation during the day and avoidance of prolonged dependency will be initiated.     The patient will continue anticoagulation for now as there have not been any problems or complications at this point.   A total of 30 minutes was spent with this patient and greater than 50% was spent in counseling and coordination of care with the patient.  Discussion included the treatment options for vascular disease including indications for surgery and intervention.  Also discussed is the appropriate timing of treatment.  In addition medical therapy was discussed.  2. Gastroesophageal reflux disease without esophagitis Continue PPI as already ordered, this medication has been reviewed and there are no changes at this time.  Avoidence of caffeine and alcohol  Moderate elevation of the head of the bed   3. Primary hypercoagulable state Continue anticoagulation as ordered.  I believe his primary has reordered his Xarelto    4. Hyperlipemia, mixed Continue statin as ordered and reviewed, no changes at this time    Cordella Shawl, MD  03/04/2024 8:37 AM

## 2024-03-25 ENCOUNTER — Encounter: Payer: Self-pay | Admitting: Gastroenterology

## 2024-07-28 ENCOUNTER — Ambulatory Visit: Admitting: Dermatology

## 2025-02-22 ENCOUNTER — Ambulatory Visit (INDEPENDENT_AMBULATORY_CARE_PROVIDER_SITE_OTHER): Admitting: Vascular Surgery
